# Patient Record
Sex: Female | Born: 1950 | Race: White | Hispanic: No | Marital: Married | Smoking: Never smoker
Health system: Southern US, Community
[De-identification: ages and names within clinical notes are randomized; demographics above are authoritative.]

## PROBLEM LIST (undated history)

## (undated) DIAGNOSIS — R42 Dizziness and giddiness: Secondary | ICD-10-CM

## (undated) DIAGNOSIS — K219 Gastro-esophageal reflux disease without esophagitis: Secondary | ICD-10-CM

## (undated) DIAGNOSIS — R7302 Impaired glucose tolerance (oral): Secondary | ICD-10-CM

## (undated) DIAGNOSIS — I809 Phlebitis and thrombophlebitis of unspecified site: Secondary | ICD-10-CM

## (undated) DIAGNOSIS — E669 Obesity, unspecified: Secondary | ICD-10-CM

## (undated) DIAGNOSIS — H8110 Benign paroxysmal vertigo, unspecified ear: Secondary | ICD-10-CM

## (undated) DIAGNOSIS — H8109 Meniere's disease, unspecified ear: Secondary | ICD-10-CM

## (undated) DIAGNOSIS — E785 Hyperlipidemia, unspecified: Secondary | ICD-10-CM

## (undated) DIAGNOSIS — T7840XA Allergy, unspecified, initial encounter: Secondary | ICD-10-CM

## (undated) DIAGNOSIS — M1990 Unspecified osteoarthritis, unspecified site: Secondary | ICD-10-CM

## (undated) DIAGNOSIS — I519 Heart disease, unspecified: Secondary | ICD-10-CM

## (undated) DIAGNOSIS — R768 Other specified abnormal immunological findings in serum: Secondary | ICD-10-CM

## (undated) HISTORY — DX: Obesity, unspecified: E66.9

## (undated) HISTORY — DX: Hyperlipidemia, unspecified: E78.5

## (undated) HISTORY — DX: Benign paroxysmal vertigo, unspecified ear: H81.10

## (undated) HISTORY — PX: OTHER SURGICAL HISTORY: SHX169

## (undated) HISTORY — DX: Heart disease, unspecified: I51.9

## (undated) HISTORY — DX: Dizziness and giddiness: R42

## (undated) HISTORY — DX: Other specified abnormal immunological findings in serum: R76.8

## (undated) HISTORY — DX: Impaired glucose tolerance (oral): R73.02

## (undated) HISTORY — DX: Gastro-esophageal reflux disease without esophagitis: K21.9

## (undated) HISTORY — DX: Phlebitis and thrombophlebitis of unspecified site: I80.9

## (undated) HISTORY — PX: BREAST BIOPSY: SHX20

## (undated) HISTORY — DX: Allergy, unspecified, initial encounter: T78.40XA

---

## 1998-12-20 ENCOUNTER — Other Ambulatory Visit: Admission: RE | Admit: 1998-12-20 | Discharge: 1998-12-20 | Payer: Self-pay | Admitting: Gynecology

## 1999-12-22 ENCOUNTER — Other Ambulatory Visit: Admission: RE | Admit: 1999-12-22 | Discharge: 1999-12-22 | Payer: Self-pay | Admitting: Gynecology

## 2001-01-02 ENCOUNTER — Encounter: Payer: Self-pay | Admitting: Gynecology

## 2001-01-02 ENCOUNTER — Ambulatory Visit (HOSPITAL_COMMUNITY): Admission: RE | Admit: 2001-01-02 | Discharge: 2001-01-02 | Payer: Self-pay | Admitting: Gynecology

## 2001-01-29 ENCOUNTER — Other Ambulatory Visit: Admission: RE | Admit: 2001-01-29 | Discharge: 2001-01-29 | Payer: Self-pay | Admitting: Gynecology

## 2002-01-02 ENCOUNTER — Encounter: Payer: Self-pay | Admitting: Gynecology

## 2002-01-02 ENCOUNTER — Ambulatory Visit (HOSPITAL_COMMUNITY): Admission: RE | Admit: 2002-01-02 | Discharge: 2002-01-02 | Payer: Self-pay | Admitting: Gynecology

## 2002-03-19 ENCOUNTER — Ambulatory Visit (HOSPITAL_COMMUNITY): Admission: RE | Admit: 2002-03-19 | Discharge: 2002-03-19 | Payer: Self-pay | Admitting: Orthopedic Surgery

## 2002-03-21 ENCOUNTER — Ambulatory Visit (HOSPITAL_BASED_OUTPATIENT_CLINIC_OR_DEPARTMENT_OTHER): Admission: RE | Admit: 2002-03-21 | Discharge: 2002-03-21 | Payer: Self-pay | Admitting: Orthopedic Surgery

## 2002-03-21 ENCOUNTER — Encounter (INDEPENDENT_AMBULATORY_CARE_PROVIDER_SITE_OTHER): Payer: Self-pay | Admitting: Specialist

## 2002-11-10 ENCOUNTER — Other Ambulatory Visit: Admission: RE | Admit: 2002-11-10 | Discharge: 2002-11-10 | Payer: Self-pay | Admitting: Gynecology

## 2003-01-15 ENCOUNTER — Ambulatory Visit (HOSPITAL_COMMUNITY): Admission: RE | Admit: 2003-01-15 | Discharge: 2003-01-15 | Payer: Self-pay | Admitting: Gynecology

## 2003-01-15 ENCOUNTER — Encounter: Payer: Self-pay | Admitting: Gynecology

## 2003-08-31 ENCOUNTER — Ambulatory Visit (HOSPITAL_COMMUNITY): Admission: RE | Admit: 2003-08-31 | Discharge: 2003-08-31 | Payer: Self-pay | Admitting: Neurosurgery

## 2003-11-23 ENCOUNTER — Other Ambulatory Visit: Admission: RE | Admit: 2003-11-23 | Discharge: 2003-11-23 | Payer: Self-pay | Admitting: Gynecology

## 2004-01-19 ENCOUNTER — Ambulatory Visit (HOSPITAL_COMMUNITY): Admission: RE | Admit: 2004-01-19 | Discharge: 2004-01-19 | Payer: Self-pay | Admitting: Gynecology

## 2004-03-16 ENCOUNTER — Ambulatory Visit (HOSPITAL_COMMUNITY): Admission: RE | Admit: 2004-03-16 | Discharge: 2004-03-16 | Payer: Self-pay | Admitting: Pulmonary Disease

## 2004-06-03 ENCOUNTER — Ambulatory Visit: Payer: Self-pay | Admitting: Cardiology

## 2004-08-05 ENCOUNTER — Ambulatory Visit (HOSPITAL_BASED_OUTPATIENT_CLINIC_OR_DEPARTMENT_OTHER): Admission: RE | Admit: 2004-08-05 | Discharge: 2004-08-05 | Payer: Self-pay | Admitting: Orthopedic Surgery

## 2005-01-03 ENCOUNTER — Other Ambulatory Visit: Admission: RE | Admit: 2005-01-03 | Discharge: 2005-01-03 | Payer: Self-pay | Admitting: Gynecology

## 2005-01-20 ENCOUNTER — Ambulatory Visit (HOSPITAL_COMMUNITY): Admission: RE | Admit: 2005-01-20 | Discharge: 2005-01-20 | Payer: Self-pay | Admitting: Gynecology

## 2005-02-13 ENCOUNTER — Ambulatory Visit: Payer: Self-pay | Admitting: Internal Medicine

## 2005-02-13 ENCOUNTER — Ambulatory Visit (HOSPITAL_COMMUNITY): Admission: RE | Admit: 2005-02-13 | Discharge: 2005-02-13 | Payer: Self-pay | Admitting: Internal Medicine

## 2005-02-13 HISTORY — PX: COLONOSCOPY: SHX174

## 2005-02-13 LAB — HM COLONOSCOPY: HM Colonoscopy: NORMAL

## 2005-05-11 ENCOUNTER — Ambulatory Visit (HOSPITAL_COMMUNITY): Admission: RE | Admit: 2005-05-11 | Discharge: 2005-05-11 | Payer: Self-pay | Admitting: Gynecology

## 2005-07-10 DIAGNOSIS — E669 Obesity, unspecified: Secondary | ICD-10-CM

## 2005-07-10 HISTORY — DX: Obesity, unspecified: E66.9

## 2005-09-14 ENCOUNTER — Ambulatory Visit: Payer: Self-pay | Admitting: Internal Medicine

## 2006-01-23 ENCOUNTER — Ambulatory Visit (HOSPITAL_COMMUNITY): Admission: RE | Admit: 2006-01-23 | Discharge: 2006-01-23 | Payer: Self-pay | Admitting: Gynecology

## 2007-01-28 ENCOUNTER — Ambulatory Visit (HOSPITAL_COMMUNITY): Admission: RE | Admit: 2007-01-28 | Discharge: 2007-01-28 | Payer: Self-pay | Admitting: Gynecology

## 2008-02-19 ENCOUNTER — Ambulatory Visit (HOSPITAL_COMMUNITY): Admission: RE | Admit: 2008-02-19 | Discharge: 2008-02-19 | Payer: Self-pay | Admitting: Gynecology

## 2008-06-03 ENCOUNTER — Ambulatory Visit: Payer: Self-pay | Admitting: Cardiology

## 2009-03-01 ENCOUNTER — Ambulatory Visit (HOSPITAL_COMMUNITY): Admission: RE | Admit: 2009-03-01 | Discharge: 2009-03-01 | Payer: Self-pay | Admitting: Gynecology

## 2009-05-27 ENCOUNTER — Encounter: Payer: Self-pay | Admitting: Family Medicine

## 2009-05-27 LAB — CONVERTED CEMR LAB: Pap Smear: NORMAL

## 2009-06-04 ENCOUNTER — Ambulatory Visit (HOSPITAL_COMMUNITY): Admission: RE | Admit: 2009-06-04 | Discharge: 2009-06-04 | Payer: Self-pay | Admitting: Gynecology

## 2009-06-21 ENCOUNTER — Ambulatory Visit: Payer: Self-pay | Admitting: Family Medicine

## 2009-06-21 DIAGNOSIS — E669 Obesity, unspecified: Secondary | ICD-10-CM

## 2009-06-26 ENCOUNTER — Encounter: Payer: Self-pay | Admitting: Family Medicine

## 2009-06-29 ENCOUNTER — Telehealth: Payer: Self-pay | Admitting: Family Medicine

## 2009-06-29 LAB — CONVERTED CEMR LAB
BUN: 15 mg/dL (ref 6–23)
CO2: 28 meq/L (ref 19–32)
Calcium: 9.1 mg/dL (ref 8.4–10.5)
Chloride: 102 meq/L (ref 96–112)
Cholesterol: 173 mg/dL (ref 0–200)
Creatinine, Ser: 0.72 mg/dL (ref 0.40–1.20)
Glucose, Bld: 100 mg/dL — ABNORMAL HIGH (ref 70–99)
Monocytes Absolute: 0.4 10*3/uL (ref 0.1–1.0)
Potassium: 4.6 meq/L (ref 3.5–5.3)
TSH: 2.468 microintl units/mL (ref 0.350–4.500)
VLDL: 22 mg/dL (ref 0–40)
Vit D, 25-Hydroxy: 28 ng/mL — ABNORMAL LOW (ref 30–89)
WBC: 8.6 10*3/uL (ref 4.0–10.5)

## 2009-09-20 ENCOUNTER — Telehealth: Payer: Self-pay | Admitting: Family Medicine

## 2009-09-20 ENCOUNTER — Encounter: Payer: Self-pay | Admitting: Family Medicine

## 2010-03-04 ENCOUNTER — Ambulatory Visit (HOSPITAL_COMMUNITY): Admission: RE | Admit: 2010-03-04 | Discharge: 2010-03-04 | Payer: Self-pay | Admitting: Gynecology

## 2010-04-25 ENCOUNTER — Encounter: Payer: Self-pay | Admitting: Family Medicine

## 2010-05-23 LAB — CONVERTED CEMR LAB: TSH: 2.569 microintl units/mL (ref 0.350–4.500)

## 2010-05-25 ENCOUNTER — Ambulatory Visit: Payer: Self-pay | Admitting: Family Medicine

## 2010-05-25 DIAGNOSIS — R42 Dizziness and giddiness: Secondary | ICD-10-CM | POA: Insufficient documentation

## 2010-06-30 ENCOUNTER — Encounter: Payer: Self-pay | Admitting: Family Medicine

## 2010-08-09 NOTE — Assessment & Plan Note (Signed)
Summary: OV   Vital Signs:  Patient profile:   60 year old female Menstrual status:  postmenopausal Height:      60 inches Weight:      178 pounds BMI:     34.89 O2 Sat:      96 % on Room air Pulse rate:   102 / minute Pulse rhythm:   regular Resp:     16 per minute BP sitting:   120 / 80  (right arm)  Vitals Entered By: Mauricia Area CMA (May 25, 2010 2:43 PM)  Nutrition Counseling: Patient's BMI is greater than 25 and therefore counseled on weight management options.  O2 Flow:  Room air CC: Dizziness   Primary Care Provider:  Syliva Overman, MD  CC:  Dizziness.  History of Present Illness: 3 day h/o vertigo with no apparent trigger, progressively worsening/no change, desp[pite use of decongestant and ear drops. This is er second ever episode , the first was 16 years ago, she had both meclizine  from the ED and had  the eplich manouever done thru ENT the same week  which resolved her symptoms, states themeclizine knocjked her out.  No recent viral illness, no resp symptoms    Current Medications (verified): 1)  Multivitamins  Tabs (Multiple Vitamin) .... Take 1 Tablet By Mouth Once A Day 2)  Vitamin D 1000 Unit Tabs (Cholecalciferol) .... One Tab Every Other Day 3)  Aspirin 81 Mg .Marland Kitchen.. 1 Tab Daily  Allergies (verified): 1)  ! Pcn 2)  ! Steroids  Review of Systems      See HPI Eyes:  Denies discharge and red eye. ENT:  Denies nasal congestion and postnasal drainage. CV:  Denies chest pain or discomfort, palpitations, and swelling of feet. Resp:  Denies cough and sputum productive. GI:  Denies abdominal pain, constipation, diarrhea, nausea, and vomiting blood. GU:  Denies dysuria and urinary frequency. MS:  Denies joint pain and stiffness. Neuro:  Complains of sensation of room spinning; denies falling down, headaches, and memory loss. Psych:  Denies anxiety and depression. Endo:  Denies cold intolerance, excessive thirst, heat intolerance, and  polyuria. Heme:  Denies abnormal bruising and bleeding. Allergy:  Denies hives or rash.  Physical Exam  General:  Well-developed,well-nourished,in no acute distress; alert,appropriate and cooperative throughout examination HEENT: No facial asymmetry,  EOMI, No sinus tenderness, TM's Clear, oropharynx  pink and moist.   Chest: Clear to auscultation bilaterally.  CVS: S1, S2, No murmurs, No S3.   Abd: Soft, Nontender.  MS: Adequate ROM spine, hips, shoulders and knees.  Ext: No edema.   CNS: CN 2-12 intact, power tone and sensation normal throughout.   Skin: Intact, no visible lesions or rashes.  Psych: Good eye contact, normal affect.  Memory intact, not anxious or depressed appearing.    Impression & Recommendations:  Problem # 1:  VERTIGO (ICD-780.4) Assessment Comment Only  Her updated medication list for this problem includes:    Antivert 12.5 Mg Tabs (Meclizine hcl) .Marland Kitchen... Take 1 tablet by mouth three times a day as needed for vertigo  Problem # 2:  OBESITY, UNSPECIFIED (ICD-278.00) Assessment: Deteriorated  Ht: 60 (05/25/2010)   Wt: 178 (05/25/2010)   BMI: 34.89 (05/25/2010)  Complete Medication List: 1)  Multivitamins Tabs (Multiple vitamin) .... Take 1 tablet by mouth once a day 2)  Vitamin D 1000 Unit Tabs (Cholecalciferol) .... One tab every other day 3)  Aspirin 81 Mg  .Marland Kitchen.. 1 tab daily 4)  Antivert 12.5 Mg Tabs (Meclizine  hcl) .... Take 1 tablet by mouth three times a day as needed for vertigo  Other Orders: T- Hemoglobin A1C ([id number]) T-TSH [phone]) T-Vitamin D (25-Hydroxy) ([id number])  Patient Instructions: 1)  Please schedule a follow-up appointment in 4 months. 2)  It is important that you exercise regularly at least 20 minutes 5 times a week. If you develop chest pain, have severe difficulty breathing, or feel very tired , stop exercising immediately and seek medical attention. 3)  You need to lose weight. Consider a lower calorie diet and  regular exercise.  4)  TSH prior to visit, ICD-9: 5)  HbgA1C prior to visit, ICD-9:   afsting asap 6)  Vitamin D Prescriptions: ANTIVERT 12.5 MG TABS (MECLIZINE HCL) Take 1 tablet by mouth three times a day as needed for vertigo  #30 x 0   Entered and Authorized by:   Syliva Overman MD   Signed by:   Syliva Overman MD on 05/25/2010   Method used:   Electronically to        [facility] (retail)       [address]       [place], [place]  [address]       Ph: [phone] or [phone]       Fax: [phone]   RxID:   [id number]    Orders Added: 1)  Est. Patient Level IV [99214] 2)  T- Hemoglobin A1C [[id number]] 3)  T-TSH [[id number]] 4)  T-Vitamin D (25-Hydroxy) [phone]

## 2010-08-09 NOTE — Letter (Signed)
Summary: SCREENING RESULTS SUMMARY  SCREENING RESULTS SUMMARY   Imported By: Lind Guest 09/20/2009 08:48:40  _____________________________________________________________________  External Attachment:    Type:   Image     Comment:   External Document

## 2010-08-09 NOTE — Progress Notes (Signed)
  Phone Note Other Incoming   Caller: DR SIMPSON Summary of Call: PLS CALL PT ADVISE HER SCREENING RESULTS SHOWE A LITTLE PLAQUE IN HER CAROTID ARTERIES , which increases her risk for stroke. She needs to continue regular exercise, low fatdiet and weightloss. She also should be taking asprin 81mg  one daily, preferably uncoated Initial call taken by: Syliva Overman MD,  September 20, 2009 6:02 PM  Follow-up for Phone Call        PATIENT AWARE Follow-up by: Adella Hare LPN,  September 22, 2009 10:24 AM

## 2010-08-09 NOTE — Letter (Signed)
Summary: HEALTH REPORT   HEALTH REPORT   Imported By: Lind Guest 05/27/2010 10:12:16  _____________________________________________________________________  External Attachment:    Type:   Image     Comment:   External Document

## 2010-09-13 ENCOUNTER — Encounter: Payer: Self-pay | Admitting: Family Medicine

## 2010-09-27 ENCOUNTER — Ambulatory Visit: Payer: Self-pay | Admitting: Family Medicine

## 2010-11-25 NOTE — Op Note (Signed)
NAME:  Tracy Humphrey, Tracy Humphrey                             ACCOUNT NO.:  [id number]   MEDICAL RECORD NO.:  [id number]                   PATIENT TYPE:  AMB   LOCATION:  DSC                                  FACILITY:  [facility]   PHYSICIAN:  Harvie Junior, M.D.                DATE OF BIRTH:  1950/11/12   DATE OF PROCEDURE:  03/21/2002  DATE OF DISCHARGE:                                 OPERATIVE REPORT   PREOPERATIVE DIAGNOSES:  1. Mass, right ankle.  2. Bunionette deformity.   POSTOPERATIVE DIAGNOSES:  1. Mass, right ankle.  2. Bunionette deformity.   PROCEDURES:  1. Excision of ankle mass, deep.  2. Excision of metatarsal prominence, fifth metatarsal, with release of     medial capsular structure and imbrication of lateral capsule.   SURGEON:  Harvie Junior, M.D.   ASSISTANT:  Currie Paris. Thedore Mins.   ANESTHESIA:  General.   BRIEF HISTORY:  She is a 60 year old female with a long history of having  ankle pain with a concern of a mass as well as having prominence of her  little metatarsal with shoe wear pain.  After failure of all conservative  care, she is ultimately taken to the operating room for excisional biopsy of  mass at the ankle and straightening up of the little toe.   DESCRIPTION OF PROCEDURE:  The patient was brought to the operating room and  after adequate anesthesia was obtained with a general anesthetic, the  patient was placed supine upon the operating table.  The right leg was  prepped and draped in the usual sterile fashion.  Following Esmarch  exsanguination of the extremity, a blood pressure tourniquet was inflated to  250 mmHg.  Following this, a linear incision was made over the mass in the  ankle in the subcutaneous tissues, taken down to the level of this mass.  The mass was encapsulated and divided on all sides and easily shelled out.  It appeared to be some combination of lipomatous tissue with some normal fat  structures.  This mass was excised en masse  and sent to pathology for  evaluation.  At this point attention was turned to the little toe, where an  incision was made over the lateral aspect of the fifth metatarsal, and  dissection was carried down to the metatarsal head.  A 3 mm band of capsular  tissue was excised, and the lateral border of the metatarsal head was  excised.  At this point attention was turned toward the medial aspect of the  capsule, where multiple perforations were made in the medial capsule.  Fluoroscopic imaging was used to make sure that the entire bump was removed  in line with the metatarsal head.  Following this attention was turned  toward closing the capsular tissue.  One the capsular tissue was closed, the  toe straightened nicely at this point.  At this, the wound was copiously  irrigated and suctioned dry.  The subcutaneous layer was then closed with a  4-0 Vicryl interrupted suture.  The ankle area was closed with 3-0 Vicryl and 4-0 nylon interrupted suture.  A sterile and compressive dressing was applied over these two areas.  The  patient was taken to the recovery room, where she was noted to be in  satisfactory condition.  Estimated blood loss for the procedure was less  than 50 cc.                                                Harvie Junior, M.D.    Ranae Plumber  D:  03/21/2002  T:  03/21/2002  Job:  [id number]

## 2010-11-25 NOTE — Op Note (Signed)
NAMESHELIAH, Tracy Humphrey                   ACCOUNT NO.:  [id number]   MEDICAL RECORD NO.:  [id number]          PATIENT TYPE:  AMB   LOCATION:  DSC                          FACILITY:  [facility]   PHYSICIAN:  Harvie Junior, M.D.   DATE OF BIRTH:  05-Nov-1950   DATE OF PROCEDURE:  08/05/2004  DATE OF DISCHARGE:                                 OPERATIVE REPORT   PREOPERATIVE DIAGNOSIS:  Painful little toe status post bunion resection  with suspected neuroma versus continued prominence.   POSTOPERATIVE DIAGNOSIS:  Painful little toe status post bunion resection  with suspected neuroma versus continued prominence.   PROCEDURE:  1.  Partial exostosis excision of the fifth metatarsal.  2.  Excision of painful neuromatous tissue, right fifth metatarsal area.   SURGEON:  Harvie Junior, M.D.   ASSISTANT:  Marshia Ly, P.A.   ANESTHESIA:  General.   BRIEF HISTORY:  She is a 60 year old female with a long history of having  significant prominence and pain over the lateral aspect of the little toe.  She was ultimately taken to the operating room for bunion excision and  repair.  She tolerated the surgery well but had painful scarring with  prominent callus.  We talked about treatment options, followed it for  awhile, did steroid injections, multiple, none of this seemed to help her  dramatically, although it would help for a short period of time.  Because of  continued complaints of pain and following for a long period of time, we  ultimately elected to take her back to the operating room for excision of  the painful prominence and she is brought to the operating room for this  procedure.   DESCRIPTION OF PROCEDURE:  The patient was brought to the operating room and  after adequate anesthesia was obtained with a general anesthetic, the  patient was placed on the operating table.  The right foot was then prepped  and draped in the usual sterile fashion.  Following this, a marking pen was  used and  the callused area was excised and the deep tissue removed with it.  This gave access to what appeared to be some neuromatous type tissue below  right over the lateral prominence.  The  neuromatous tissue was removed with  a combination of a knife and rongeur and once this had been completed, the  area was copiously irrigated.  The tourniquet was let down to make sure  there was no significant bleeding and finding none, attention was turned to  the capsular layer.  The capsule was opened longitudinally and the  metatarsal head prominence was resected.  At this point, the capsule was  then closed  with 4-0 Vicryl interrupted sutures, the skin with 4-0 nylon sutures.  At  this point, a sterile compressive dressing was applied and the patient was  taken to the recovery room where she was noted to be in satisfactory  condition.  She will be sent to home in a hard sole shoe.      JLG/MEDQ  D:  08/05/2004  T:  08/05/2004  Job:  [id number]

## 2010-11-25 NOTE — Op Note (Signed)
NAMEBROOKLEY, SPITLER                   ACCOUNT NO.:  [id number]   MEDICAL RECORD NO.:  [id number]          PATIENT TYPE:  AMB   LOCATION:  DAY                           FACILITY:  [facility]   PHYSICIAN:  R. Roetta Sessions, M.D. DATE OF BIRTH:  04/14/1951   DATE OF PROCEDURE:  02/13/2005  DATE OF DISCHARGE:                                 OPERATIVE REPORT   INDICATIONS FOR PROCEDURE:  The patient is a 60 year old lady referred out  of courtesy of Dr. Greta Doom in [place] for colonoscopy. She is devoid of any  lower GI tract symptoms. She has never had her colon imaged. There is no  family history of colorectal neoplasia. Colonoscopy is now being done. This  approach has been discussed with the patient at length. Potential risks,  benefits, and alternatives have been reviewed and questions answered. She is  agreeable. Please see documentation in the medical record.   PROCEDURE NOTE:  O2 saturation, blood pressure, pulse, and respirations were  monitored throughout the entire procedure. Conscious sedation with Versed 4  mg IV and Demerol 75 mg IV in divided doses.   INSTRUMENT:  Olympus video chip system.   FINDINGS:  Digital rectal exam revealed no abnormalities.   ENDOSCOPIC FINDINGS:  Prep was good.   Rectum:  Examination of the rectal mucosa including retroflexed view of the  anal verge revealed no abnormalities.   Colon:  Colonic mucosa was surveyed from the rectosigmoid junction through  the left, transverse, and right colon to the area of the appendiceal  orifice, ileocecal valve, and cecum. These structures were well seen and  photographed for the record. From this level, the scope was slowly  withdrawn, and all previously mentioned mucosal surfaces were again seen.  The colonic mucosa appeared normal. The patient tolerated the procedure well  and was reactive to endoscopy.   IMPRESSION:  1. Normal rectum.  2. Normal colon.   RECOMMENDATIONS:  Repeat screening colonoscopy in 10  years.      RMR/MEDQ  D:  02/13/2005  T:  02/13/2005  Job:  [id number]

## 2011-02-07 ENCOUNTER — Ambulatory Visit: Payer: Self-pay | Admitting: Family Medicine

## 2011-02-22 ENCOUNTER — Other Ambulatory Visit: Payer: Self-pay | Admitting: Family Medicine

## 2011-02-22 DIAGNOSIS — Z139 Encounter for screening, unspecified: Secondary | ICD-10-CM

## 2011-03-06 ENCOUNTER — Ambulatory Visit (HOSPITAL_COMMUNITY)
Admission: RE | Admit: 2011-03-06 | Discharge: 2011-03-06 | Disposition: A | Discharge status: Home / Self Care | Payer: 59 | Source: Ambulatory Visit | Attending: Family Medicine | Admitting: Family Medicine

## 2011-03-06 DIAGNOSIS — Z1231 Encounter for screening mammogram for malignant neoplasm of breast: Secondary | ICD-10-CM | POA: Insufficient documentation

## 2011-03-06 DIAGNOSIS — Z139 Encounter for screening, unspecified: Secondary | ICD-10-CM

## 2011-03-08 ENCOUNTER — Ambulatory Visit (INDEPENDENT_AMBULATORY_CARE_PROVIDER_SITE_OTHER): Payer: 59 | Admitting: Family Medicine

## 2011-03-08 ENCOUNTER — Encounter: Payer: Self-pay | Admitting: Family Medicine

## 2011-03-08 VITALS — BP 138/82 | HR 94 | Temp 99.0°F | Resp 16 | Ht 60.0 in | Wt 174.0 lb

## 2011-03-08 DIAGNOSIS — R51 Headache: Secondary | ICD-10-CM | POA: Insufficient documentation

## 2011-03-08 DIAGNOSIS — H9201 Otalgia, right ear: Secondary | ICD-10-CM | POA: Insufficient documentation

## 2011-03-08 DIAGNOSIS — J309 Allergic rhinitis, unspecified: Secondary | ICD-10-CM

## 2011-03-08 DIAGNOSIS — H9209 Otalgia, unspecified ear: Secondary | ICD-10-CM

## 2011-03-08 DIAGNOSIS — E669 Obesity, unspecified: Secondary | ICD-10-CM

## 2011-03-08 DIAGNOSIS — R519 Headache, unspecified: Secondary | ICD-10-CM | POA: Insufficient documentation

## 2011-03-08 MED ORDER — SULFAMETHOXAZOLE-TRIMETHOPRIM 800-160 MG PO TABS: 1.0000 | ORAL_TABLET | Freq: Two times a day (BID) | ORAL | Status: AC

## 2011-03-08 MED ORDER — FLUCONAZOLE 150 MG PO TABS: ORAL_TABLET | ORAL | Status: DC

## 2011-03-08 NOTE — Assessment & Plan Note (Signed)
New sharp headaches, if persist, barin scan indicated

## 2011-03-08 NOTE — Assessment & Plan Note (Signed)
Unchanged. Patient re-educated about  the importance of commitment to a  minimum of 150 minutes of exercise per week. The importance of healthy food choices with portion control discussed. Encouraged to start a food diary, count calories and to consider  joining a support group. Sample diet sheets offered. Goals set by the patient for the next several months.    

## 2011-03-08 NOTE — Patient Instructions (Signed)
F/u in 6 months.  You are being prescribed an antibiotic please take the entire 7 day course.  If you continue to experience sharp apins in your head pls contact me so that I order a brain scan and refer you to a specialist.  Lifestyle goals of daily exercise at least 5 days per week, and a diet rich in fruit and vegetables is encouraged. A weight loss goal of 2 pounds per month is realisticand adequate for you.  I am  concerned about your blood pressure, so it is important that you follow through on these recommendations

## 2011-03-08 NOTE — Assessment & Plan Note (Signed)
Antibiotic prescribed, if no improvement will need brain scan and referral

## 2011-03-08 NOTE — Progress Notes (Signed)
  Subjective:    Patient ID: Tracy Humphrey, female    DOB: 18-Nov-1950, 60 y.o.   MRN: [id number]  HPI 3 day h/o sharp lancinating pains behind right ear intermittently, woke pt this morning at 3 am and has not been able to sleep since then.States she has experienced this in the past, uncertain if related to sinus symptoms in the past She has an approx 3 week h/o excessive head congestion with some sinus pressure and drainage, the right ear itself at times has had some pressure.She denies any recent fever , chills , sore throat or cough. Has only used OTC meds    Review of Systems  See HPI Denies recent fever or chills. Denies  sore throat. Denies chest congestion, productive cough or wheezing. Denies chest pains, palpitations and leg swelling Denies abdominal pain, nausea, vomiting,diarrhea or constipation.   Denies dysuria, frequency, hesitancy or incontinence. Denies joint pain, swelling and limitation in mobility. Denies numbness, or tingling. Denies depression, anxiety or insomnia. Denies skin break down or rash.        Objective:   Physical Exam  Patient alert and oriented and in no cardiopulmonary distress.  HEENT: No facial asymmetry, EOMI, no sinus tenderness,  oropharynx pink and moist.  Neck supple no adenopathy. Tender over posterior right ear on scalp, skin and scalp normal Chest: Clear to auscultation bilaterally.  CVS: S1, S2 no murmurs, no S3.  ABD: Soft non tender. Bowel sounds normal.  Ext: No edema  MS: Adequate ROM spine, shoulders, hips and knees.   Psych: Good eye contact, normal affect. Memory intact not anxious or depressed appearing.  CNS: CN 2-12 intact, power,normal throughout.       Assessment & Plan:

## 2011-03-10 ENCOUNTER — Telehealth: Payer: Self-pay | Admitting: Family Medicine

## 2011-03-10 NOTE — Telephone Encounter (Signed)
I am very happy to hear this

## 2011-03-10 NOTE — Telephone Encounter (Signed)
noted 

## 2011-06-08 ENCOUNTER — Telehealth: Payer: Self-pay | Admitting: Family Medicine

## 2011-06-08 ENCOUNTER — Other Ambulatory Visit: Payer: Self-pay | Admitting: Family Medicine

## 2011-06-08 MED ORDER — SCOPOLAMINE 1 MG/3DAYS TD PT72: 1.0000 | MEDICATED_PATCH | TRANSDERMAL | Status: AC

## 2011-06-08 NOTE — Telephone Encounter (Signed)
Requests trans copolamine for travel, will send to pharmacy, pls lert her know sent to her local pharmacy on file

## 2011-06-09 NOTE — Telephone Encounter (Signed)
Pt aware.

## 2011-06-15 ENCOUNTER — Ambulatory Visit (INDEPENDENT_AMBULATORY_CARE_PROVIDER_SITE_OTHER): Payer: 59 | Admitting: Otolaryngology

## 2011-06-15 DIAGNOSIS — H903 Sensorineural hearing loss, bilateral: Secondary | ICD-10-CM

## 2011-06-15 DIAGNOSIS — R42 Dizziness and giddiness: Secondary | ICD-10-CM

## 2011-06-15 DIAGNOSIS — H8109 Meniere's disease, unspecified ear: Secondary | ICD-10-CM

## 2011-06-15 DIAGNOSIS — H8110 Benign paroxysmal vertigo, unspecified ear: Secondary | ICD-10-CM

## 2011-06-19 ENCOUNTER — Telehealth: Payer: Self-pay | Admitting: Family Medicine

## 2011-06-19 NOTE — Telephone Encounter (Signed)
Pt called in on 12/06 with a c/o disabling vertigo since 06/13/2011, which actually prevented her from taking planned vacation. She was not responding to meclizine, which she already had, so an urgent appt was made with ENT who graciously agreed to see her that same evening.  The ENT notes are available for review, pt states ENT was a bit surprised she had never had a brain scan, and actually a few months agio pt had been seen by me in the office with head and  neck pain, and at that time I had mentioned the possible need to scan her brain if her symptoms persisted or worsened. Today 12/10 is her best day yet, though she still had to take valium this morning, still very symptomatic. I will schedule an open MRI brain with gado contrast per ent recommendation for evaluation of severe vertigo

## 2011-06-20 ENCOUNTER — Other Ambulatory Visit: Payer: Self-pay | Admitting: Family Medicine

## 2011-06-20 ENCOUNTER — Other Ambulatory Visit: Payer: Self-pay

## 2011-06-20 DIAGNOSIS — R42 Dizziness and giddiness: Secondary | ICD-10-CM

## 2011-06-20 LAB — COMPREHENSIVE METABOLIC PANEL
Albumin: 4.3 g/dL (ref 3.5–5.2)
Calcium: 10 mg/dL (ref 8.4–10.5)
Chloride: 102 mEq/L (ref 96–112)
Creat: 0.63 mg/dL (ref 0.50–1.10)
Potassium: 4.5 mEq/L (ref 3.5–5.3)

## 2011-06-22 ENCOUNTER — Ambulatory Visit
Admission: RE | Admit: 2011-06-22 | Discharge: 2011-06-22 | Disposition: A | Discharge status: Home / Self Care | Payer: Self-pay | Source: Ambulatory Visit | Attending: Family Medicine | Admitting: Family Medicine

## 2011-06-22 DIAGNOSIS — R42 Dizziness and giddiness: Secondary | ICD-10-CM

## 2011-06-22 MED ORDER — GADOBENATE DIMEGLUMINE 529 MG/ML IV SOLN
15.0000 mL | Freq: Once | INTRAVENOUS | Status: AC | PRN
  Administered 2011-06-22: 15 mL via INTRAVENOUS

## 2011-06-29 ENCOUNTER — Other Ambulatory Visit: Payer: Self-pay | Admitting: Family Medicine

## 2011-06-29 ENCOUNTER — Telehealth: Payer: Self-pay | Admitting: Family Medicine

## 2011-06-29 DIAGNOSIS — R42 Dizziness and giddiness: Secondary | ICD-10-CM

## 2011-06-29 NOTE — Telephone Encounter (Signed)
3 week h/o disabling vertigo, I recommend neurology evaluation, has had some resopnse to valium and has ENT f/u , pls refer to neurologist reynolds, love or weyman for further evaluation pls fax over the MRI of the brain, January appt is fine , they are all in [place] in the same office , but Dr Thad Ranger may be no longer there, either of the 2 listed is fine, thanks, appt preferred in January

## 2011-07-06 ENCOUNTER — Ambulatory Visit (INDEPENDENT_AMBULATORY_CARE_PROVIDER_SITE_OTHER): Payer: Commercial Managed Care - PPO | Admitting: Otolaryngology

## 2011-07-06 DIAGNOSIS — H8110 Benign paroxysmal vertigo, unspecified ear: Secondary | ICD-10-CM

## 2011-07-06 DIAGNOSIS — R42 Dizziness and giddiness: Secondary | ICD-10-CM

## 2011-07-10 NOTE — Telephone Encounter (Signed)
noted 

## 2011-07-10 NOTE — Telephone Encounter (Signed)
Patient went to DR. Teohs and he said she did not need to go to neurologist had appointment with Dr. Modesto Charon and patient stated to cancel this

## 2011-07-11 DIAGNOSIS — E785 Hyperlipidemia, unspecified: Secondary | ICD-10-CM

## 2011-07-11 DIAGNOSIS — R7302 Impaired glucose tolerance (oral): Secondary | ICD-10-CM

## 2011-07-11 HISTORY — DX: Impaired glucose tolerance (oral): R73.02

## 2011-07-11 HISTORY — DX: Hyperlipidemia, unspecified: E78.5

## 2011-07-20 ENCOUNTER — Ambulatory Visit (INDEPENDENT_AMBULATORY_CARE_PROVIDER_SITE_OTHER): Payer: Commercial Managed Care - PPO | Admitting: Otolaryngology

## 2011-08-17 ENCOUNTER — Ambulatory Visit: Payer: Self-pay | Admitting: Neurology

## 2011-10-09 ENCOUNTER — Telehealth: Payer: Self-pay

## 2011-10-09 DIAGNOSIS — R5383 Other fatigue: Secondary | ICD-10-CM

## 2011-10-09 DIAGNOSIS — E669 Obesity, unspecified: Secondary | ICD-10-CM

## 2011-10-09 DIAGNOSIS — Z139 Encounter for screening, unspecified: Secondary | ICD-10-CM

## 2011-10-09 NOTE — Telephone Encounter (Signed)
Patient needs order for labwork. Printed and gave to patient

## 2011-10-11 ENCOUNTER — Other Ambulatory Visit: Payer: Self-pay | Admitting: Family Medicine

## 2011-10-11 LAB — BASIC METABOLIC PANEL
BUN: 18 mg/dL (ref 6–23)
CO2: 28 mEq/L (ref 19–32)
Sodium: 142 mEq/L (ref 135–145)

## 2011-10-11 LAB — CBC WITH DIFFERENTIAL/PLATELET
Basophils Absolute: 0 10*3/uL (ref 0.0–0.1)
HCT: 41.3 % (ref 36.0–46.0)
Lymphocytes Relative: 34 % (ref 12–46)
Lymphs Abs: 2.5 10*3/uL (ref 0.7–4.0)
MCH: 28.4 pg (ref 26.0–34.0)
Monocytes Relative: 6 % (ref 3–12)
Neutro Abs: 4.3 10*3/uL (ref 1.7–7.7)
Neutrophils Relative %: 59 % (ref 43–77)
Platelets: 185 10*3/uL (ref 150–400)
RBC: 4.68 MIL/uL (ref 3.87–5.11)
RDW: 15 % (ref 11.5–15.5)

## 2011-10-11 LAB — LIPID PANEL
Cholesterol: 182 mg/dL (ref 0–200)
HDL: 45 mg/dL (ref >39)
LDL Cholesterol: 117 mg/dL — ABNORMAL HIGH (ref 0–99)
Triglycerides: 101 mg/dL (ref <150)
VLDL: 20 mg/dL (ref 0–40)

## 2011-10-11 LAB — TSH: TSH: 2.347 u[IU]/mL (ref 0.350–4.500)

## 2011-10-12 LAB — HEMOGLOBIN A1C
Hgb A1c MFr Bld: 6.3 % — ABNORMAL HIGH
Mean Plasma Glucose: 134 mg/dL — ABNORMAL HIGH

## 2011-10-12 LAB — VITAMIN D 25 HYDROXY (VIT D DEFICIENCY, FRACTURES): Vit D, 25-Hydroxy: 47 ng/mL (ref 30–89)

## 2011-10-19 ENCOUNTER — Ambulatory Visit: Payer: Commercial Managed Care - PPO | Admitting: Family Medicine

## 2011-10-19 ENCOUNTER — Ambulatory Visit (INDEPENDENT_AMBULATORY_CARE_PROVIDER_SITE_OTHER): Payer: Commercial Managed Care - PPO | Admitting: Otolaryngology

## 2011-10-19 ENCOUNTER — Encounter: Payer: Self-pay | Admitting: Family Medicine

## 2011-10-19 VITALS — BP 124/84 | HR 86 | Resp 18 | Ht 60.0 in | Wt 170.0 lb

## 2011-10-19 DIAGNOSIS — E8881 Metabolic syndrome: Secondary | ICD-10-CM | POA: Insufficient documentation

## 2011-10-19 DIAGNOSIS — H8109 Meniere's disease, unspecified ear: Secondary | ICD-10-CM

## 2011-10-19 DIAGNOSIS — H903 Sensorineural hearing loss, bilateral: Secondary | ICD-10-CM

## 2011-10-19 DIAGNOSIS — E785 Hyperlipidemia, unspecified: Secondary | ICD-10-CM

## 2011-10-19 DIAGNOSIS — E669 Obesity, unspecified: Secondary | ICD-10-CM

## 2011-10-19 DIAGNOSIS — R7303 Prediabetes: Secondary | ICD-10-CM

## 2011-10-19 DIAGNOSIS — R7301 Impaired fasting glucose: Secondary | ICD-10-CM

## 2011-10-19 DIAGNOSIS — H9202 Otalgia, left ear: Secondary | ICD-10-CM

## 2011-10-19 DIAGNOSIS — R42 Dizziness and giddiness: Secondary | ICD-10-CM

## 2011-10-19 DIAGNOSIS — E119 Type 2 diabetes mellitus without complications: Secondary | ICD-10-CM | POA: Insufficient documentation

## 2011-10-19 DIAGNOSIS — H814 Vertigo of central origin: Secondary | ICD-10-CM

## 2011-10-19 NOTE — Patient Instructions (Addendum)
F/u in 4 month  You are now prediabetic, and your bad cholesterol is higher than it should be.  Please follow a carbohydrate restricted   diet, we will provide information on this.  Please attend a diabetic education group session or individual counseling session with the nutritionist at the hospital to help you to clearly understand how to eat. This is very important  Please commit to 30 minutes 5 days per week of physical acitivity, when you are able to physically  A healthy diet is rich in fruit, vegetables and whole grains. Poultry fish, nuts and beans are a healthy choice for protein rather then red meat. A low sodium diet and drinking 64 ounces of water daily is generally recommended. Oils and sweet should be limited. Carbohydrates especially for those who are diabetic or overweight, should be limited to 30-45 gram per meal. It is important to eat on a regular schedule, at least 3 times daily. Snacks should be primarily fruits, vegetables or nuts.   Weight loss goal of 3 to 6 pounds per month  No medication at this time  You need to see ENT for recurrent vertigo today, and I strongly encourage you too be more open to the need for medication to either prevent frequency and severity of recurrence , as well as medication to take when you have the beginning of a bad episode. Medication is needed for some conditions some times!  HBA1C and fasting blood sugar and lipids in 4 month  I hope you feel better soon, and I know that you will do extremely well with lifestyle changes to improve your health

## 2011-10-19 NOTE — Progress Notes (Signed)
  Subjective:    Patient ID: Tracy Humphrey, female    DOB: 11/14/1950, 61 y.o.   MRN: [id number]  HPI 2 day h/o disabling vertigo. Pt recently exposed to significant pressure changes and temperature which she believes is contributing. Also c/o increased allergy symptoms with season change, using sudafed effectively, however 1 week h/o pressure in left ear. Since December 2012, Tracy Humphrey has had significant vertigo, has an established dx of meneire's , however following ENT eval today, states she was told her current flare of symptoms was likely due to this and not to BPV which she has also had in the past. She is also in to discuss recent lab abnormalitise wich make her prediabetic as well as dyslipidemic. She is highly motivated to lifelstyle change which will address both of these and improve her health   Review of Systems See HPI Denies recent fever or chills. Denies  sore throat. Denies chest congestion, productive cough or wheezing. Denies chest pains, palpitations and leg swelling Denies abdominal pain, nausea, vomiting,diarrhea or constipation.   Denies dysuria, frequency, hesitancy or incontinence. Denies swelling and limitation in mobility. Denies headaches, seizures, numbness, or tingling. Denies  anxiety or insomnia. Denies skin break down or rash.        Objective:   Physical Exam  Patient alert and oriented and in no cardiopulmonary distress.  HEENT: No facial asymmetry, EOMI, no sinus tenderness,  oropharynx pink and moist.  Neck supple no adenopathy.Good light reflex bilaterally. No nystagmus  Chest: Clear to auscultation bilaterally.  CVS: S1, S2 no murmurs, no S3.  ABD: Soft non tender. Bowel sounds normal.  Ext: No edema  MS: Adequate ROM spine, shoulders, hips and knees.  Skin: Intact, no ulcerations or rash noted.  Psych: Good eye contact, normal affect. Memory intact not anxious or depressed appearing.  CNS: CN 2-12 intact, power, tone and sensation normal  throughout.       Assessment & Plan:

## 2011-10-22 NOTE — Assessment & Plan Note (Signed)
Deteriorated. Patient re-educated about  the importance of commitment to a  minimum of 150 minutes of exercise per week. The importance of healthy food choices with portion control discussed. Encouraged to start a food diary, count calories and to consider  joining a support group. Sample diet sheets offered. Goals set by the patient for the next several months.    

## 2011-10-22 NOTE — Assessment & Plan Note (Signed)
Recurrent vertigo x 2 days. Has been troubled by verigo intermittently x 5 months, ENT to re evaluate today

## 2011-10-22 NOTE — Assessment & Plan Note (Signed)
Good light reflex, cerumen partially obscuring TM, will wait on ENT eval for further management

## 2011-10-22 NOTE — Assessment & Plan Note (Signed)
Nutrition counseling done in office, literaure provided , and pt encouraged strongly to attend group or individual diabetic teaching

## 2011-10-22 NOTE — Assessment & Plan Note (Signed)
Hyperlipidemia:Low fat diet discussed and encouraged.  Repeat labs in 4 monht

## 2011-11-04 DIAGNOSIS — H8110 Benign paroxysmal vertigo, unspecified ear: Secondary | ICD-10-CM

## 2011-11-04 HISTORY — DX: Benign paroxysmal vertigo, unspecified ear: H81.10

## 2011-11-10 ENCOUNTER — Telehealth (HOSPITAL_COMMUNITY): Payer: Self-pay | Admitting: Dietician

## 2011-11-13 NOTE — Telephone Encounter (Signed)
Left message on voicemail confirming registration for requested class time.

## 2011-11-13 NOTE — Telephone Encounter (Signed)
Received referral from Dr. Lodema Hong on 11/09/11. Reviewed last progress note and Dr. Lodema Hong is referring pt for either group or individual counseling for pre-diabetes. Referral form accompanied fax with request from pt to attend group diabetes class on 11/28/11 at 10 AM.

## 2011-11-20 ENCOUNTER — Ambulatory Visit: Payer: Commercial Managed Care - PPO | Admitting: Family Medicine

## 2011-11-28 ENCOUNTER — Encounter (HOSPITAL_COMMUNITY): Payer: Self-pay | Admitting: Dietician

## 2011-11-28 NOTE — Progress Notes (Signed)
[facility] Diabetes Class Completion  Date:Nov 28, 2011  Time: 10:00 AM  Pt attended [facility]'s Diabetes Class on Nov 28, 2011.   Patient was educated on the following topics: carbohydrate metabolism in relation to diabetes, sources of carbohydrate, carbohydrate counting, meal planning strategies, food label reading, and portion control.   Emmory Solivan A. Kayan, RD, LDN Date:Nov 28, 2011 Time: 10:00 AM 

## 2012-02-08 LAB — HEMOGLOBIN A1C
Hgb A1c MFr Bld: 5.9 % — ABNORMAL HIGH (ref <5.7)
Mean Plasma Glucose: 123 mg/dL — ABNORMAL HIGH (ref <117)

## 2012-02-09 LAB — LIPID PANEL
HDL: 43 mg/dL (ref >39)
LDL Cholesterol: 110 mg/dL — ABNORMAL HIGH (ref 0–99)

## 2012-02-09 LAB — GLUCOSE, RANDOM: Glucose, Bld: 98 mg/dL (ref 70–99)

## 2012-02-09 NOTE — Addendum Note (Signed)
Addended by: Abner Greenspan on: 02/09/2012 09:49 AM   Modules accepted: Orders

## 2012-02-16 ENCOUNTER — Ambulatory Visit: Payer: Commercial Managed Care - PPO | Admitting: Family Medicine

## 2012-02-23 ENCOUNTER — Ambulatory Visit: Payer: Commercial Managed Care - PPO | Admitting: Family Medicine

## 2012-03-25 ENCOUNTER — Other Ambulatory Visit (HOSPITAL_COMMUNITY): Payer: Self-pay | Admitting: Gynecology

## 2012-03-25 DIAGNOSIS — Z139 Encounter for screening, unspecified: Secondary | ICD-10-CM

## 2012-04-05 ENCOUNTER — Ambulatory Visit (HOSPITAL_COMMUNITY)
Admission: RE | Admit: 2012-04-05 | Discharge: 2012-04-05 | Disposition: A | Discharge status: Home / Self Care | Payer: 59 | Source: Ambulatory Visit | Attending: Gynecology | Admitting: Gynecology

## 2012-04-05 DIAGNOSIS — Z1231 Encounter for screening mammogram for malignant neoplasm of breast: Secondary | ICD-10-CM | POA: Insufficient documentation

## 2012-04-05 DIAGNOSIS — Z139 Encounter for screening, unspecified: Secondary | ICD-10-CM

## 2012-06-27 ENCOUNTER — Other Ambulatory Visit: Payer: Self-pay | Admitting: Family Medicine

## 2012-06-28 LAB — LIPID PANEL
HDL: 67 mg/dL (ref >39)
LDL Cholesterol: 105 mg/dL — ABNORMAL HIGH (ref 0–99)
Triglycerides: 86 mg/dL (ref <150)
VLDL: 17 mg/dL (ref 0–40)

## 2012-06-28 LAB — HEMOGLOBIN A1C: Hgb A1c MFr Bld: 5.7 % — ABNORMAL HIGH (ref <5.7)

## 2012-07-01 ENCOUNTER — Ambulatory Visit: Payer: Commercial Managed Care - PPO | Admitting: Family Medicine

## 2012-07-10 DIAGNOSIS — R768 Other specified abnormal immunological findings in serum: Secondary | ICD-10-CM

## 2012-07-10 HISTORY — DX: Other specified abnormal immunological findings in serum: R76.8

## 2013-01-09 ENCOUNTER — Telehealth: Payer: Self-pay

## 2013-01-09 DIAGNOSIS — E669 Obesity, unspecified: Secondary | ICD-10-CM

## 2013-01-09 DIAGNOSIS — R7303 Prediabetes: Secondary | ICD-10-CM

## 2013-01-09 DIAGNOSIS — Z139 Encounter for screening, unspecified: Secondary | ICD-10-CM

## 2013-01-09 DIAGNOSIS — E785 Hyperlipidemia, unspecified: Secondary | ICD-10-CM

## 2013-01-09 NOTE — Telephone Encounter (Signed)
Opened for lab order.

## 2013-01-13 LAB — LIPID PANEL
LDL Cholesterol: 133 mg/dL — ABNORMAL HIGH (ref 0–99)
Triglycerides: 114 mg/dL (ref <150)
VLDL: 23 mg/dL (ref 0–40)

## 2013-01-13 LAB — COMPREHENSIVE METABOLIC PANEL
ALT: 18 U/L (ref 0–35)
AST: 17 U/L (ref 0–37)
Albumin: 4.5 g/dL (ref 3.5–5.2)
Alkaline Phosphatase: 79 U/L (ref 39–117)
Potassium: 5.7 mEq/L — ABNORMAL HIGH (ref 3.5–5.3)
Sodium: 143 mEq/L (ref 135–145)
Total Bilirubin: 0.9 mg/dL (ref 0.3–1.2)
Total Protein: 7.1 g/dL (ref 6.0–8.3)

## 2013-01-13 LAB — HEMOGLOBIN A1C
Hgb A1c MFr Bld: 5.8 % — ABNORMAL HIGH (ref <5.7)
Mean Plasma Glucose: 120 mg/dL — ABNORMAL HIGH (ref <117)

## 2013-01-13 LAB — CBC
HCT: 40.2 % (ref 36.0–46.0)
Hemoglobin: 14.2 g/dL (ref 12.0–15.0)
RBC: 4.71 MIL/uL (ref 3.87–5.11)

## 2013-01-13 LAB — TSH: TSH: 3.323 u[IU]/mL (ref 0.350–4.500)

## 2013-01-14 LAB — VITAMIN D 25 HYDROXY (VIT D DEFICIENCY, FRACTURES): Vit D, 25-Hydroxy: 36 ng/mL (ref 30–89)

## 2013-01-17 ENCOUNTER — Encounter: Payer: Self-pay | Admitting: Family Medicine

## 2013-01-23 ENCOUNTER — Encounter: Payer: Self-pay | Admitting: Family Medicine

## 2013-01-23 ENCOUNTER — Ambulatory Visit (INDEPENDENT_AMBULATORY_CARE_PROVIDER_SITE_OTHER): Payer: Commercial Managed Care - PPO | Admitting: Family Medicine

## 2013-01-23 VITALS — BP 120/78 | HR 82 | Resp 16 | Ht 60.0 in | Wt 161.2 lb

## 2013-01-23 DIAGNOSIS — E669 Obesity, unspecified: Secondary | ICD-10-CM

## 2013-01-23 DIAGNOSIS — E785 Hyperlipidemia, unspecified: Secondary | ICD-10-CM

## 2013-01-23 DIAGNOSIS — R7309 Other abnormal glucose: Secondary | ICD-10-CM

## 2013-01-23 DIAGNOSIS — Z23 Encounter for immunization: Secondary | ICD-10-CM

## 2013-01-23 DIAGNOSIS — G4700 Insomnia, unspecified: Secondary | ICD-10-CM

## 2013-01-23 DIAGNOSIS — Z2911 Encounter for prophylactic immunotherapy for respiratory syncytial virus (RSV): Secondary | ICD-10-CM

## 2013-01-23 DIAGNOSIS — R7303 Prediabetes: Secondary | ICD-10-CM

## 2013-01-23 DIAGNOSIS — E8881 Metabolic syndrome: Secondary | ICD-10-CM

## 2013-01-23 NOTE — Patient Instructions (Addendum)
F/U in mid December  Fasting lipid, HBA1C and chem 7 in December  3 to 5 days before f/u  Please commit to daily exercise for improved health and wellbeing  Please cut back on fried and fatty foods, red meats, butter and cheese, your total and bad cholesterol are slightly elevated, which increases your risk of heart disease and stroke   Weight loss goal of 8 to  10 pounds in next 6 months as we discussed  TdaP and zostavax today

## 2013-01-25 DIAGNOSIS — G4700 Insomnia, unspecified: Secondary | ICD-10-CM | POA: Insufficient documentation

## 2013-01-25 NOTE — Assessment & Plan Note (Addendum)
Deteriorated, pt is motivated to changing her diet, and starting daily execie to normalize her blood sugars once more, as she has done in the past

## 2013-01-25 NOTE — Progress Notes (Signed)
  Subjective:    Patient ID: Tracy Humphrey, female    DOB: 1950-12-20, 62 y.o.   MRN: [id number]  HPI Pt in for lab follow up and  review of her general health. 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 As I explain to her , these chronic currently mild conditions all need to be addressed, to improve her current health so that she remains as healthy as she has managed to be over the years. Poor sleep, and"high expectations" of herself, espescialy as it relates to work performance continue to be her major issues. 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 Denies sinus pressure, nasal congestion, ear pain or sore throat.No recent inner ear episodes, keeps meclizine on hand Denies chest congestion, productive cough or wheezing. Denies chest pains, palpitations and leg swelling Denies abdominal pain, nausea, vomiting,diarrhea or constipation.   Denies dysuria, frequency, hesitancy or incontinence. Denies joint pain, swelling and limitation in mobility. Denies headaches, seizures, numbness, or tingling. Denies skin break down or rash.        Objective:   Physical Exam Patient alert and oriented and in no cardiopulmonary distress.Looks tired  HEENT: No facial asymmetry, EOMI, n oropharynx pink and moist.  Neck supple  Chest: Clear to auscultation bilaterally.  CVS: S1, S2 no murmurs, no S3.  ABD: Soft non tender. Bowel sounds normal.  Ext: No edema  MS: Adequate ROM spine, shoulders, hips and knees.  Skin: Intact, no ulcerations or rash noted.  Psych: Fairly Good eyecontact,, blunted affect.  Memory intact   CNS: CN 2-12 intact, power, tone and sensation normal throughout.        Assessment & Plan:

## 2013-01-25 NOTE — Assessment & Plan Note (Signed)
Increased CV risk, hence needs to improve lipids, and blood sugar and lose weight

## 2013-01-25 NOTE — Assessment & Plan Note (Signed)
Has longstanding c/o difficulty with sleep, sleep hygiene discussed and reviewed. Anxiety and stress is contributing to worsening of symptom Uses OTC medication only at this time, no interest in prescription med.currently, wil commit to increased physical activity, commitment to exercise , which she finds  helpful

## 2013-01-25 NOTE — Assessment & Plan Note (Signed)
Improved. Pt applauded on succesful weight loss through lifestyle change, and encouraged to continue same. Weight loss goal set for the next several months. Reports that last Summer she was 20 pounds less however

## 2013-01-25 NOTE — Assessment & Plan Note (Signed)
Deteriorated, total and LDL have both increased, pt to change to low fat diet, and commit to regular exercise

## 2013-02-25 ENCOUNTER — Telehealth: Payer: Self-pay | Admitting: Family Medicine

## 2013-02-25 MED ORDER — TEMAZEPAM 7.5 MG PO CAPS: 7.5000 mg | ORAL_CAPSULE | Freq: Every evening | ORAL | Status: DC | PRN

## 2013-02-25 NOTE — Telephone Encounter (Signed)
Longstanding problem of poor sleep. Unable to "shut her mind off" despite good sleep hygiene. Has resisted med management to date and is cautious about this, which is good. She is willing to "try" low dose restoril, I did explain that she should not be worried about weight gain on this low dose , espescialy if healthy food choices are made, with portion control. Pls send printed script to pharmacy, and leave info re sleep hygiene with her (envelope on her desk or in her "box" is approprite, let her know med should be ready for pick up tomorrow also pls

## 2013-02-28 ENCOUNTER — Telehealth: Payer: Self-pay | Admitting: Family Medicine

## 2013-02-28 MED ORDER — BENZOYL PEROXIDE 2.5 % EX GEL: CUTANEOUS | Status: AC

## 2013-02-28 NOTE — Telephone Encounter (Signed)
Pt requests refill on 2.5% benzoyl peroxide gel which she uses on her face as needed, for red areas. Hss had the original script form derm reportedly for approx 20 years. Advised I am able to prescribe this for her and will do so

## 2013-03-26 ENCOUNTER — Telehealth: Payer: Self-pay | Admitting: Family Medicine

## 2013-03-26 ENCOUNTER — Other Ambulatory Visit: Payer: Self-pay

## 2013-03-26 MED ORDER — ALPRAZOLAM 0.5 MG PO TABS: ORAL_TABLET | ORAL | Status: AC

## 2013-03-26 MED ORDER — TEMAZEPAM 15 MG PO CAPS: 15.0000 mg | ORAL_CAPSULE | Freq: Every evening | ORAL | Status: DC | PRN

## 2013-03-26 MED ORDER — ALPRAZOLAM 0.5 MG PO TABS: ORAL_TABLET | ORAL | Status: DC

## 2013-03-26 NOTE — Telephone Encounter (Signed)
Pt continues to have difficulty sleeping, mind unable to shut down , despite doubling restoril to 15mg  tablet  Will attempt a short trial of xanax to take with the 15mg  dose at bedtime, she may need help with thera[py to cope with her currently stressful work environment

## 2013-03-26 NOTE — Telephone Encounter (Signed)
Med to be sent to [facility]

## 2013-04-13 DIAGNOSIS — R002 Palpitations: Secondary | ICD-10-CM

## 2013-04-14 ENCOUNTER — Other Ambulatory Visit (HOSPITAL_COMMUNITY): Payer: Self-pay | Admitting: Gynecology

## 2013-04-14 DIAGNOSIS — Z139 Encounter for screening, unspecified: Secondary | ICD-10-CM

## 2013-04-22 ENCOUNTER — Ambulatory Visit (HOSPITAL_COMMUNITY)
Admission: RE | Admit: 2013-04-22 | Discharge: 2013-04-22 | Disposition: A | Discharge status: Home / Self Care | Payer: 59 | Source: Ambulatory Visit | Attending: Gynecology | Admitting: Gynecology

## 2013-04-22 DIAGNOSIS — Z139 Encounter for screening, unspecified: Secondary | ICD-10-CM

## 2013-04-22 DIAGNOSIS — Z1231 Encounter for screening mammogram for malignant neoplasm of breast: Secondary | ICD-10-CM | POA: Insufficient documentation

## 2013-05-15 ENCOUNTER — Other Ambulatory Visit: Payer: Self-pay

## 2013-06-09 ENCOUNTER — Ambulatory Visit (INDEPENDENT_AMBULATORY_CARE_PROVIDER_SITE_OTHER): Payer: 59 | Admitting: Family Medicine

## 2013-06-09 ENCOUNTER — Encounter: Payer: Self-pay | Admitting: Family Medicine

## 2013-06-09 VITALS — BP 138/80 | HR 95 | Resp 16 | Ht 60.0 in | Wt 172.4 lb

## 2013-06-09 DIAGNOSIS — R7303 Prediabetes: Secondary | ICD-10-CM

## 2013-06-09 DIAGNOSIS — K3189 Other diseases of stomach and duodenum: Secondary | ICD-10-CM

## 2013-06-09 DIAGNOSIS — R1013 Epigastric pain: Secondary | ICD-10-CM

## 2013-06-09 DIAGNOSIS — E669 Obesity, unspecified: Secondary | ICD-10-CM

## 2013-06-09 DIAGNOSIS — E8881 Metabolic syndrome: Secondary | ICD-10-CM

## 2013-06-09 DIAGNOSIS — R894 Abnormal immunological findings in specimens from other organs, systems and tissues: Secondary | ICD-10-CM

## 2013-06-09 DIAGNOSIS — R768 Other specified abnormal immunological findings in serum: Secondary | ICD-10-CM

## 2013-06-09 DIAGNOSIS — G4700 Insomnia, unspecified: Secondary | ICD-10-CM

## 2013-06-09 DIAGNOSIS — F439 Reaction to severe stress, unspecified: Secondary | ICD-10-CM

## 2013-06-09 DIAGNOSIS — R7309 Other abnormal glucose: Secondary | ICD-10-CM

## 2013-06-09 DIAGNOSIS — E785 Hyperlipidemia, unspecified: Secondary | ICD-10-CM

## 2013-06-09 DIAGNOSIS — R002 Palpitations: Secondary | ICD-10-CM

## 2013-06-09 DIAGNOSIS — I519 Heart disease, unspecified: Secondary | ICD-10-CM

## 2013-06-09 DIAGNOSIS — Z8249 Family history of ischemic heart disease and other diseases of the circulatory system: Secondary | ICD-10-CM

## 2013-06-09 DIAGNOSIS — Z733 Stress, not elsewhere classified: Secondary | ICD-10-CM

## 2013-06-09 HISTORY — DX: Heart disease, unspecified: I51.9

## 2013-06-09 MED ORDER — PANTOPRAZOLE SODIUM 20 MG PO TBEC: 20.0000 mg | DELAYED_RELEASE_TABLET | Freq: Every day | ORAL | Status: DC
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
## 2013-06-09 NOTE — Patient Instructions (Addendum)
Cancel December visit, f/u in 4 month, call if you need me before  Please get labs this week, H pylori test, lipid, chem 7, and  tSH if none in 1 year   Your EKG is reassuring , however, with your family history, and this new sensation of "fluttering " I am still requesting a cardiology evaluation  I believe that your gallbladder needs to be tested and have referred you for an Ultra sound, if you have no gallstones, the one additional test of function is a HIDA scan which we will need to have done. Blood tests this week will see if you have been exposed to   Bacteria  which may cause heartburn, and I am going ahead and starting you on specific reflux medication pantoprazole one daily. Please reduce caffeine intake.  You are referred to Dr Kendell Bane, and I will also send him a message about you Helicobacter Pylori Antibodies Test This is a blood test which looks for a germ called Helicobacter pylori. This can also be diagnosed by a breath test or a microscopic examination of a portion (biopsy) of the small bowel. H. pylori is a germ that is found in the cells that line the stomach. It is a risk factor for stomach and small bowel ulcers, long standing inflammation of the lining of the stomach, or even ulcers that may occur in the esophagus (the canal that runs from the mouth to the stomach). This bacterium is also a factor in stomach cancer. The amount of the bacteria is found in about 10% of healthy persons younger than 64 years of age and the amount of the bacteria increases with age. Most persons with these bacteria have no symptoms; however, it is thought that when these bacteria cause ulcers, antibiotic medications can be used to help eliminate or reduce the problem.  PREPARATION FOR TEST No preparation or fasting is necessary. NORMAL FINDINGS Negative (H-pylori bacteria not present) Ranges for normal findingsmay vary among different laboratories and hospitals. You should always check with your  doctor after having lab work or other tests done to discuss the meaning of your test results and whether or not your values are considered within normal limits. MEANING OF TEST  Your caregiver will go over the test results with you and discuss the importance and meaning of your results, as well as treatment options and the need for additional tests if necessary. It is your responsibility to obtain your test results. Ask the lab or department performing the test when and how you will get your results. OBTAINING THE TEST RESULTS It is your responsibility to obtain your test results. Ask the lab or department performing the test when and how you will get your results. Document Released: 07/20/2004 Document Revised: 09/18/2011 Document Reviewed: 06/06/2008 [facility] Patient Information 2014 [place], [place]. Indigestion  Indigestion is discomfort in the upper belly (abdomen). HOME CARE  Avoid foods and drinks that make your problems worse. You may want to avoid:  Caffeine and alcohol.  Chocolate.  Peppermint.  Garlic and onions.  Spicy foods.  Citrus fruits, such as oranges, lemons, or limes.  Tomato-based foods such as sauce, chili, salsa, and pizza.  Fried and fatty foods.  Avoid eating for 3 hours before your bedtime.  Eat small meals instead of large meals more often.  Stop smoking if you smoke.  Maintain a healthy weight.  Wear loose-fitting clothing. Do not wear anything tight around your waist.  Raise the head of your bed 4 to 8 inches  with wood blocks.  Only take medicines as told by your doctor.  Do not take aspirin or ibuprofen. GET HELP RIGHT AWAY IF:  You are not better after 2 days.  You have chest pain that goes into your neck, arms, back, jaw, or upper belly.  You have trouble swallowing.  You keep throwing up (vomiting).  You have black or bloody poop (stool).  You have a fever.  You have trouble breathing, you feel dizzy, or you pass out  (faint).  You are sweating a lot.  You have severe belly pain.  You lose weight without trying. MAKE SURE YOU:  Understand these instructions.  Will watch your condition.  Will get help right away if you are not doing well or get worse. Document Released: 07/29/2010 Document Revised: 09/18/2011 Document Reviewed: 02/08/2011 [facility] Patient Information 2014 [place], [place].

## 2013-06-10 ENCOUNTER — Other Ambulatory Visit (HOSPITAL_COMMUNITY): Payer: 59

## 2013-06-11 ENCOUNTER — Ambulatory Visit (INDEPENDENT_AMBULATORY_CARE_PROVIDER_SITE_OTHER): Payer: 59 | Admitting: Internal Medicine

## 2013-06-11 ENCOUNTER — Encounter: Payer: Self-pay | Admitting: Internal Medicine

## 2013-06-11 ENCOUNTER — Telehealth: Payer: Self-pay | Admitting: Family Medicine

## 2013-06-11 VITALS — BP 126/70 | HR 74 | Temp 98.6°F | Ht 60.0 in | Wt 171.0 lb

## 2013-06-11 DIAGNOSIS — R768 Other specified abnormal immunological findings in serum: Secondary | ICD-10-CM | POA: Insufficient documentation

## 2013-06-11 DIAGNOSIS — K219 Gastro-esophageal reflux disease without esophagitis: Secondary | ICD-10-CM

## 2013-06-11 LAB — H. PYLORI ANTIBODY, IGG: H Pylori IgG: >8 {ISR} — ABNORMAL HIGH

## 2013-06-11 MED ORDER — PANTOPRAZOLE SODIUM 40 MG PO TBEC: 40.0000 mg | DELAYED_RELEASE_TABLET | Freq: Two times a day (BID) | ORAL | Status: DC

## 2013-06-11 MED ORDER — BIS SUBCIT-METRONID-TETRACYC 140-125-125 MG PO CAPS: 3.0000 | ORAL_CAPSULE | Freq: Three times a day (TID) | ORAL | Status: DC

## 2013-06-11 NOTE — Patient Instructions (Signed)
GERD information  Trial of Dexilant 60 mg daily - 2 week sample supply  Stool for occult blood testing  You may yet benefit from a diagnostic EGD;   Will review cardiology consult which is pending.  Telephone report in 2 weeks  Loose 20 pounds over the next 12 months

## 2013-06-11 NOTE — Progress Notes (Signed)
Primary Care Physician:  Syliva Overman, MD Primary Gastroenterologist:  Dr.   Pre-Procedure History & Physical: HPI:  Tracy Humphrey is a 62 y.o. female here for further evaluation of a week or so history of "fluttering" in her chest. Has taken 2 TUMS every day since age 24 at the direction of her gynecologist. Stop taking TUMS just couple days ago and did experience some vague symptoms which he describes as heartburn but prior to this period, she denies any sort of indigestion, heartburn reflux. Has not had any nausea, vomiting or abdominal pain. She had negative cardiac stress test years ago. She is scheduled to see the cardiologist in the future. Recent EKG was normal. She's gained 20 pounds in the past year. Bowel function is described as normal. No melena or hematochezia. No family history of GI malignancy. Normal screening colonoscopy in 2016; she's here for average risk screening 2016.  She denies a prior history of peptic ulcer disease. Chest or her upper GI tract evaluated. She tells me that Dr. Lodema Hong has ordered an ultrasound. Also, H. pylori serologies were drawn through her office. Recently given a prescription for pantoprazole 20 mg daily Has not started that prescription as of yet.  Past Medical History  Diagnosis Date  . Obesity 2007     prior to this had no weight issue     Past Surgical History  Procedure Laterality Date  . Cesarean section  29 years ago   . Right foot surgery 2003. 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 Colonoscopy  02/13/2005    Dr. Jena Gauss- normal rectum, normal colon    Prior to Admission medications   Medication Sig Start Date End Date Taking? Authorizing Provider  aspirin (ASPIRIN LOW DOSE) 81 MG EC tablet Take 81 mg by mouth daily.     Yes Historical Provider, MD  calcium carbonate (TUMS - DOSED IN MG ELEMENTAL CALCIUM) 500 MG chewable tablet Chew 2 tablets by mouth daily.     Yes Historical Provider, MD   Multiple Vitamin (MULTIVITAMIN PO) Take by mouth.     Yes Historical Provider, MD  Benzoyl Peroxide 2.5 % gel Apply once daily to affected area(s) , as needed 02/28/13 02/28/14  Kerri Perches, MD  pantoprazole (PROTONIX) 20 MG tablet Take 1 tablet (20 mg total) by mouth daily. 06/09/13 07/10/13  Kerri Perches, MD  temazepam (RESTORIL) 15 MG capsule Take 1 capsule (15 mg total) by mouth at bedtime as needed for sleep. 03/26/13   Kerri Perches, MD    Allergies as of 06/11/2013 - Review Complete 06/11/2013  Allergen Reaction Noted  . Prednisone  03/08/2011  . Penicillins  06/21/2009    Family History  Problem Relation Age of Onset  . Dementia Mother 64  . Hypertension Mother   . Hypertension Father   . Heart disease Father   . Diabetes Father   . Hypertension Sister   . Coronary artery disease Sister 68  . Hypertension Brother 1  . Alcohol abuse Brother     History   Social History  . Marital Status: Married    Spouse Name: N/A    Number of Children: N/A  . Years of Education: N/A   Occupational History  . [employer]     Social History Main Topics  . Smoking status: Never Smoker   . Smokeless tobacco: Not on file     Comment: Never smoke  . Alcohol Use: No  .  Drug Use: No  . Sexual Activity: Not on file   Other Topics Concern  . Not on file   Social History Narrative  . No narrative on file    Review of Systems: See HPI, otherwise negative ROS  Physical Exam: BP 126/70  Pulse 74  Temp(Src) 98.6 F (37 C) (Oral)  Ht 5' (1.524 m)  Wt 171 lb (77.565 kg)  BMI 33.40 kg/m2 General:   Alert,  Well-developed, well-nourished, pleasant and cooperative in NAD Skin:  Intact without significant lesions or rashes. Eyes:  Sclera clear, no icterus.   Conjunctiva pink. Ears:  Normal auditory acuity. Nose:  No deformity, discharge,  or lesions. Mouth:  No deformity or lesions. Neck:  Supple; no masses or thyromegaly. No significant cervical adenopathy. Lungs:   Clear throughout to auscultation.   No wheezes, crackles, or rhonchi. No acute distress. Heart:  Regular rate with occasional ectopic beat noted ; no murmurs, clicks, rubs,  or gallops. Abdomen: Non-distended, normal bowel sounds.  Soft and nontender without appreciable mass or hepatosplenomegaly.  Pulses:  Normal pulses noted. Extremities:  Without clubbing or edema.  Impression :  Very pleasant 62 year old lady with recent history of intermittent "fluttering" in her chest. Has been on TUMS for years for the calcium component. Stopped  taking them recently and perceives some vague symptoms consistent with reflux. However, prior to just recently, she really hasn't had any upper GI tract symptoms. She has gained 20 pounds over the past one year which could certainly predispose one to reflux. She describes "fluttering" and was noted to have some ectopic beats on physical exam today. She has no alarm GI symptoms at this time. There is nothing in her history to suggest occult biliary or acid peptic disease otherwise.   Recommendations:  GERD information  Trial of Dexilant 60 mg daily - 2 week sample supply  Stool for occult blood testing  No need for urgent EGD at this time, however, patient may yet benefit from a diagnostic EGD;   Will review cardiology consult and ultrasound which is pending.  Telephone report in 2 weeks  Loose 20 pounds over the next 12 months  Further recommendations to follow.

## 2013-06-11 NOTE — Telephone Encounter (Signed)
Pls call pharmacy [facility] , let them know do not dispense the protonix 20mg  tablet, instead 40mg  twice daily for 10 days. Pls call pt [name], let her know I spoke with Dr Kendell Bane, we agree on the treatment together, she will do starting tomorrow the protonix 40mg  twice dail y for 10 days, with pylera for 10 days. He states she must go through taking all tablets as prescribed so the infection is treated fully so she does not get resistant  To treatment. After she has finished the treatment she is to take the dexilant one daily that he gave her until she sees him in follow up.  He says that the bacteria mostlikely has been present for a LONG time, feco oral transmission, (stool to mouth...kids messing around in childhood)

## 2013-06-12 ENCOUNTER — Ambulatory Visit (INDEPENDENT_AMBULATORY_CARE_PROVIDER_SITE_OTHER): Payer: 59 | Admitting: Internal Medicine

## 2013-06-12 DIAGNOSIS — K279 Peptic ulcer, site unspecified, unspecified as acute or chronic, without hemorrhage or perforation: Secondary | ICD-10-CM

## 2013-06-12 LAB — IFOBT (OCCULT BLOOD): IFOBT: NEGATIVE

## 2013-06-12 LAB — LIPID PANEL
LDL Cholesterol: 136 mg/dL — ABNORMAL HIGH (ref 0–99)
Triglycerides: 77 mg/dL (ref <150)
VLDL: 15 mg/dL (ref 0–40)

## 2013-06-12 LAB — BASIC METABOLIC PANEL
Calcium: 9.6 mg/dL (ref 8.4–10.5)
Glucose, Bld: 101 mg/dL — ABNORMAL HIGH (ref 70–99)
Sodium: 141 mEq/L (ref 135–145)

## 2013-06-12 LAB — HEMOGLOBIN A1C
Hgb A1c MFr Bld: 5.9 % — ABNORMAL HIGH (ref <5.7)
Mean Plasma Glucose: 123 mg/dL — ABNORMAL HIGH (ref <117)

## 2013-06-12 NOTE — Progress Notes (Signed)
Pt returned ifobt and it was negative.  

## 2013-06-12 NOTE — Telephone Encounter (Signed)
Patient aware.

## 2013-06-13 ENCOUNTER — Encounter: Payer: Self-pay | Admitting: Cardiology

## 2013-06-13 ENCOUNTER — Ambulatory Visit (INDEPENDENT_AMBULATORY_CARE_PROVIDER_SITE_OTHER): Payer: 59 | Admitting: Cardiology

## 2013-06-13 ENCOUNTER — Ambulatory Visit (HOSPITAL_COMMUNITY)
Admission: RE | Admit: 2013-06-13 | Discharge: 2013-06-13 | Disposition: A | Discharge status: Home / Self Care | Payer: 59 | Source: Ambulatory Visit | Attending: Family Medicine | Admitting: Family Medicine

## 2013-06-13 ENCOUNTER — Ambulatory Visit (HOSPITAL_COMMUNITY)
Admission: RE | Admit: 2013-06-13 | Discharge: 2013-06-13 | Disposition: A | Discharge status: Home / Self Care | Payer: 59 | Source: Ambulatory Visit | Attending: Cardiology | Admitting: Cardiology

## 2013-06-13 VITALS — BP 118/78 | HR 86 | Ht 60.0 in | Wt 173.0 lb

## 2013-06-13 DIAGNOSIS — R142 Eructation: Secondary | ICD-10-CM | POA: Insufficient documentation

## 2013-06-13 DIAGNOSIS — K3189 Other diseases of stomach and duodenum: Secondary | ICD-10-CM | POA: Insufficient documentation

## 2013-06-13 DIAGNOSIS — R1013 Epigastric pain: Secondary | ICD-10-CM

## 2013-06-13 DIAGNOSIS — R002 Palpitations: Secondary | ICD-10-CM | POA: Insufficient documentation

## 2013-06-13 DIAGNOSIS — R141 Gas pain: Secondary | ICD-10-CM | POA: Insufficient documentation

## 2013-06-13 MED ORDER — FLUCONAZOLE 150 MG PO TABS: ORAL_TABLET | ORAL | Status: AC

## 2013-06-13 NOTE — Progress Notes (Signed)
48 hour Holter Monitor in progress, ends @ 1:27 pm on 06-15-13

## 2013-06-13 NOTE — Assessment & Plan Note (Signed)
Recent ECG showed a single PVC. She has no history of cardiac ischemic or structural disease, no exertional symptoms at this time. She is also undergoing GI workup as outlined. We will plan a screening 48 hour Holter monitor to exclude any obvious arrhythmias and plan to inform her of the results.

## 2013-06-13 NOTE — Patient Instructions (Addendum)
Your physician recommends that you schedule a follow-up appointment in: TO BE DETERMINED  Your physician has recommended that you wear a holter monitor. Holter monitors are medical devices that record the heart's electrical activity. Doctors most often use these monitors to diagnose arrhythmias. Arrhythmias are problems with the speed or rhythm of the heartbeat. The monitor is a small, portable device. You can wear one while you do your normal daily activities. This is usually used to diagnose what is causing palpitations/syncope (passing out).  WE WILL CALL YOU WITH YOUR TEST RESULTS/INSTRUCTIONS/NEXT STEPS ONCE RECEIVED BY THE PROVIDER

## 2013-06-13 NOTE — Progress Notes (Signed)
Clinical Summary Ms. Sheen is a 62 y.o.female referred for cardiology consultation by Dr. Lodema Hong. History reviewed. She reports a feeling of "bubbles and fluttering" in her epigastric and midsternal area over the last several weeks. She is concurrently undergoing GI evaluation with recent documentation of Helicobacter pylori on screening blood work. She is undergoing treatment for this and will have an EGD soon with Dr. Jena Gauss.  Recent lab work reviewed findings cholesterol 200, triglycerides 77, HDL 49, LDL 136, hemoglobin A1c 5.9, potassium 4.7, BUN 17, creatinine 0.7.  ECG reviewed showing sinus rhythm with single PVC, otherwise normal. She has had no dizziness or syncope, no exertional chest pain or breathlessness. Symptoms occur every day to every other day.   Allergies  Allergen Reactions  . Prednisone     All steroids  . Penicillins     Current Outpatient Prescriptions  Medication Sig Dispense Refill  . aspirin (ASPIRIN LOW DOSE) 81 MG EC tablet Take 81 mg by mouth daily.        . Benzoyl Peroxide 2.5 % gel Apply once daily to affected area(s) , as needed  60 g  0  . bismuth-metronidazole-tetracycline (PYLERA) 140-125-125 MG per capsule Take 3 capsules by mouth 4 (four) times daily -  before meals and at bedtime.  120 capsule  0  . calcium carbonate (TUMS - DOSED IN MG ELEMENTAL CALCIUM) 500 MG chewable tablet Chew 2 tablets by mouth daily.        . Multiple Vitamin (MULTIVITAMIN PO) Take by mouth.        . pantoprazole (PROTONIX) 40 MG tablet Take 1 tablet (40 mg total) by mouth 2 (two) times daily.  20 tablet  0  . temazepam (RESTORIL) 15 MG capsule Take 1 capsule (15 mg total) by mouth at bedtime as needed for sleep.  90 capsule  1   No current facility-administered medications for this visit.    Past Medical History  Diagnosis Date  . Obesity 2007     Prior to this had no weight issue   . GERD (gastroesophageal reflux disease)     Past Surgical History  Procedure  Laterality Date  . Cesarean section  29 years ago   . Right foot surgery 2003. 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 Colonoscopy  02/13/2005    Dr. Jena Gauss- normal rectum, normal colon    Family History  Problem Relation Age of Onset  . Dementia Mother 9  . Hypertension Mother   . Hypertension Father   . Heart disease Father   . Diabetes Father   . Hypertension Sister   . Coronary artery disease Sister 25  . Hypertension Brother 62  . Alcohol abuse Brother     Social History Ms. Finkbiner reports that she has never smoked. She does not have any smokeless tobacco history on file. Ms. Hunnicutt reports that she does not drink alcohol.  Review of Systems Negative except as outlined.  Physical Examination Filed Vitals:   06/13/13 1123  BP: 118/78  Pulse: 86   Filed Weights   06/13/13 1123  Weight: 173 lb (78.472 kg)   Patient appears comfortable at rest. HEENT: Conjunctiva and lids normal, oropharynx clear. Neck: Supple, no elevated JVP or carotid bruits, no thyromegaly. Lungs: Clear to auscultation, nonlabored breathing at rest. Cardiac: Regular rate and rhythm, no S3 or significant systolic murmur, no pericardial rub. Extremities: No pitting edema, distal pulses 2+. Skin: Warm and dry. Neuropsychiatric:  Alert and oriented x3, affect grossly appropriate.   Problem List and Plan   Palpitations Recent ECG showed a single PVC. She has no history of cardiac ischemic or structural disease, no exertional symptoms at this time. She is also undergoing GI workup as outlined. We will plan a screening 48 hour Holter monitor to exclude any obvious arrhythmias and plan to inform her of the results.    Jonelle Sidle, M.D., F.A.C.C.

## 2013-06-15 DIAGNOSIS — E663 Overweight: Secondary | ICD-10-CM | POA: Insufficient documentation

## 2013-06-15 DIAGNOSIS — F439 Reaction to severe stress, unspecified: Secondary | ICD-10-CM | POA: Insufficient documentation

## 2013-06-15 NOTE — Assessment & Plan Note (Signed)
Deteriorated Patient educated about the importance of limiting  Carbohydrate intake , the need to commit to daily physical activity for a minimum of 30 minutes , and to commit weight loss. The fact that changes in all these areas will reduce or eliminate all together the development of diabetes is stressed.    

## 2013-06-15 NOTE — Assessment & Plan Note (Signed)
New onset intermittent palpitations. EKG in office shows few PVC's.No other abnormality Will refer to cardiology for further eval

## 2013-06-15 NOTE — Assessment & Plan Note (Signed)
positive test , will treat , pt education done, and she is being co managed by GI with regard to this

## 2013-06-15 NOTE — Assessment & Plan Note (Signed)
Good Sleep hygiene , and reduced dependence on medication. With regular exercise and attempts to reduce anxiety and stress in her life, this will improve

## 2013-06-15 NOTE — Assessment & Plan Note (Signed)
Deteriorated. Patient re-educated about  the importance of commitment to a  minimum of 150 minutes of exercise per week. The importance of healthy food choices with portion control discussed. Encouraged to start a food diary, count calories and to consider  joining a support group. Sample diet sheets offered. Goals set by the patient for the next several months.    

## 2013-06-15 NOTE — Assessment & Plan Note (Signed)
Deteriorated, as pt has gained weight, and her HBa1C is elevated. The importance of consistently working on improving this is stressed so that her CV risk is reduced

## 2013-06-15 NOTE — Assessment & Plan Note (Signed)
Increased personal and professional stress x 18 month, would benefit from therapy, but no interest at this time. Overall has coped very well using her own coping skills, and there is overall improvement in level of anxiety  And it's impact on her ability to function Will re address at next visit, should complete PHQ9 also

## 2013-06-15 NOTE — Assessment & Plan Note (Addendum)
Refer for GBUS to r/o gall stones, also check H pylori

## 2013-06-15 NOTE — Assessment & Plan Note (Signed)
Elevated LDL though overall improved. Hyperlipidemia:Low fat diet discussed and encouraged.

## 2013-06-16 ENCOUNTER — Other Ambulatory Visit: Payer: Self-pay | Admitting: Internal Medicine

## 2013-06-16 ENCOUNTER — Telehealth: Payer: Self-pay | Admitting: Internal Medicine

## 2013-06-16 DIAGNOSIS — K219 Gastro-esophageal reflux disease without esophagitis: Secondary | ICD-10-CM

## 2013-06-16 NOTE — Telephone Encounter (Signed)
Patient is scheduled for EGD w/RMR on 12/18 at 8:00 am and she is aware instructions have been mailed to her

## 2013-06-16 NOTE — Telephone Encounter (Signed)
Message copied by Glendora Score on Mon Jun 16, 2013  8:55 AM ------      Message from: Jennings Books      Created: Fri Jun 13, 2013 11:34 AM                   ----- Message -----         From: Corbin Ade, MD         Sent: 06/13/2013   9:55 AM           To: Milinda Antis, #            Please schedule a diagnostic EGD on December 17, 18th or 19th (probably not on an office today) ------

## 2013-06-17 ENCOUNTER — Encounter (HOSPITAL_COMMUNITY): Payer: Self-pay | Admitting: Pharmacy Technician

## 2013-06-18 ENCOUNTER — Encounter: Payer: Self-pay | Admitting: Cardiology

## 2013-06-18 ENCOUNTER — Encounter: Payer: Self-pay | Admitting: Internal Medicine

## 2013-06-20 ENCOUNTER — Ambulatory Visit (HOSPITAL_COMMUNITY)
Admission: RE | Admit: 2013-06-20 | Discharge: 2013-06-20 | Disposition: A | Discharge status: Home / Self Care | Payer: 59 | Source: Ambulatory Visit | Attending: Cardiology | Admitting: Cardiology

## 2013-06-20 ENCOUNTER — Other Ambulatory Visit (HOSPITAL_COMMUNITY): Payer: Self-pay | Admitting: Cardiology

## 2013-06-20 DIAGNOSIS — R002 Palpitations: Secondary | ICD-10-CM | POA: Insufficient documentation

## 2013-06-20 DIAGNOSIS — Z6833 Body mass index (BMI) 33.0-33.9, adult: Secondary | ICD-10-CM | POA: Insufficient documentation

## 2013-06-20 DIAGNOSIS — I517 Cardiomegaly: Secondary | ICD-10-CM

## 2013-06-20 DIAGNOSIS — E669 Obesity, unspecified: Secondary | ICD-10-CM | POA: Insufficient documentation

## 2013-06-20 NOTE — Progress Notes (Signed)
*  PRELIMINARY RESULTS* Echocardiogram 2D Echocardiogram has been performed.  Guadalupe Nickless 06/20/2013, 11:02 AM

## 2013-06-23 ENCOUNTER — Encounter: Payer: Self-pay | Admitting: Internal Medicine

## 2013-06-23 ENCOUNTER — Encounter: Payer: Self-pay | Admitting: Cardiology

## 2013-06-25 ENCOUNTER — Ambulatory Visit: Payer: Commercial Managed Care - PPO | Admitting: Family Medicine

## 2013-06-26 ENCOUNTER — Encounter (HOSPITAL_COMMUNITY): Payer: Self-pay | Admitting: *Deleted

## 2013-06-26 ENCOUNTER — Encounter (HOSPITAL_COMMUNITY)
Admission: RE | Disposition: A | Discharge status: Home / Self Care | Payer: Self-pay | Source: Ambulatory Visit | Attending: Internal Medicine

## 2013-06-26 ENCOUNTER — Ambulatory Visit (HOSPITAL_COMMUNITY)
Admission: RE | Admit: 2013-06-26 | Discharge: 2013-06-26 | Disposition: A | Discharge status: Home / Self Care | Payer: 59 | Source: Ambulatory Visit | Attending: Internal Medicine | Admitting: Internal Medicine

## 2013-06-26 DIAGNOSIS — Z7982 Long term (current) use of aspirin: Secondary | ICD-10-CM | POA: Insufficient documentation

## 2013-06-26 DIAGNOSIS — R1013 Epigastric pain: Secondary | ICD-10-CM

## 2013-06-26 DIAGNOSIS — A048 Other specified bacterial intestinal infections: Secondary | ICD-10-CM | POA: Insufficient documentation

## 2013-06-26 DIAGNOSIS — K2970 Gastritis, unspecified, without bleeding: Secondary | ICD-10-CM

## 2013-06-26 DIAGNOSIS — K219 Gastro-esophageal reflux disease without esophagitis: Secondary | ICD-10-CM

## 2013-06-26 DIAGNOSIS — K228 Other specified diseases of esophagus: Secondary | ICD-10-CM

## 2013-06-26 DIAGNOSIS — K2990 Gastroduodenitis, unspecified, without bleeding: Secondary | ICD-10-CM

## 2013-06-26 DIAGNOSIS — K2940 Chronic atrophic gastritis without bleeding: Secondary | ICD-10-CM | POA: Insufficient documentation

## 2013-06-26 HISTORY — PX: ESOPHAGOGASTRODUODENOSCOPY: SHX5428

## 2013-06-26 SURGERY — EGD (ESOPHAGOGASTRODUODENOSCOPY)
Anesthesia: Moderate Sedation

## 2013-06-26 MED ORDER — MEPERIDINE HCL 100 MG/ML IJ SOLN
INTRAMUSCULAR | Status: DC | PRN
  Administered 2013-06-26: 50 mg via INTRAVENOUS
  Administered 2013-06-26 (×2): 25 mg via INTRAVENOUS

## 2013-06-26 MED ORDER — ONDANSETRON HCL 4 MG/2ML IJ SOLN
INTRAMUSCULAR | Status: DC | PRN
  Administered 2013-06-26: 4 mg via INTRAMUSCULAR

## 2013-06-26 MED ORDER — MEPERIDINE HCL 100 MG/ML IJ SOLN
INTRAMUSCULAR | Status: AC
  Filled 2013-06-26: qty 2

## 2013-06-26 MED ORDER — SODIUM CHLORIDE 0.9 % IV SOLN
INTRAVENOUS | Status: DC
  Administered 2013-06-26: 1000 mL via INTRAVENOUS

## 2013-06-26 MED ORDER — STERILE WATER FOR IRRIGATION IR SOLN
Status: DC | PRN
  Administered 2013-06-26: 08:00:00

## 2013-06-26 MED ORDER — BUTAMBEN-TETRACAINE-BENZOCAINE 2-2-14 % EX AERO
INHALATION_SPRAY | CUTANEOUS | Status: DC | PRN
  Administered 2013-06-26: 2 via TOPICAL

## 2013-06-26 MED ORDER — MIDAZOLAM HCL 5 MG/5ML IJ SOLN
INTRAMUSCULAR | Status: AC
  Filled 2013-06-26: qty 10

## 2013-06-26 MED ORDER — MIDAZOLAM HCL 5 MG/5ML IJ SOLN
INTRAMUSCULAR | Status: DC | PRN
  Administered 2013-06-26: 2 mg via INTRAVENOUS
  Administered 2013-06-26: 1 mg via INTRAVENOUS
  Administered 2013-06-26: 2 mg via INTRAVENOUS

## 2013-06-26 MED ORDER — ONDANSETRON HCL 4 MG/2ML IJ SOLN
INTRAMUSCULAR | Status: AC
  Filled 2013-06-26: qty 2

## 2013-06-26 NOTE — Op Note (Signed)
[facility] [address] [place], [address]   ENDOSCOPY PROCEDURE REPORT  PATIENT: Tracy Humphrey, Tracy Humphrey  MR#: [id number] BIRTHDATE: [birth year] , 62  yrs. old GENDER: Female ENDOSCOPIST: R.  Roetta Sessions, MD FACP FACG REFERRED BY:  Syliva Overman, M.D. PROCEDURE DATE:  06/26/2013 PROCEDURE:     EGD with esophageal and gastric biopsy  INDICATIONS:    Vague epigastric discomfort, predominantly postprandially. Patient describes as "bubbling". Negative ultrasound. Finished treatment for H. pylori recently. LFTs normal earlier this year  INFORMED CONSENT:   The risks, benefits, limitations, alternatives and imponderables have been discussed.  The potential for biopsy, esophogeal dilation, etc. have also been reviewed.  Questions have been answered.  All parties agreeable.  Please see the history and physical in the medical record for more information.  MEDICATIONS:  Versed 5 mg IV and Demerol 100 mg IV in divided doses. Zofran 4 mg IV. Cetacaine spray.  DESCRIPTION OF PROCEDURE:   The EG-[id number] ([id number])  endoscope was introduced through the mouth and advanced to the second portion of the duodenum without difficulty or limitations.  The mucosal surfaces were surveyed very carefully during advancement of the scope and upon withdrawal.  Retroflexion view of the proximal stomach and esophagogastric junction was performed.      FINDINGS:  2 cm "tongue" of salmon-colored epithelium coming up into the distal esophagus. Please see image 8. No esophagitis no tumor. Stomach empty. Examination the gastric mucosa including a retroflexion  view of the proximal stomach and esophagogastric junction demonstrated only a few scattered superficial mucosal erosions. There was no ulcer or infiltrating process seen. Pylorus patent and easily traversed. Examination of the bulb, second and third portion revealed entirely normal duodenal mucosa.  Biopsies of the abnormal gastric mucosa  taken for histologic study. Subsequently, biopsies of the abnormal distal esophagus taken.  THERAPEUTIC / DIAGNOSTIC MANEUVERS PERFORMED:  See above   COMPLICATIONS:  None  IMPRESSION:   Abnormal Distal esophagus-query short segment Barrett's esophagus.. Gastric erosions of doubtful clinical significance-status post biopsy  RECOMMENDATIONS:   Followup on pathology. Continue Dexilant 60 mg orally daily. We'll contemplate a HIDA with CCK challenge in the near future to wrap upper her GI evaluation depending on biopsy results  Today's findings would not really explain her symptoms. Her symptoms are atypical. It's good to know she has had a recent negative cardiac evaluation.  My findings and recommendations have been discussed with the patient's husband at the bedside today.    _______________________________ R. Roetta Sessions, MD FACP [facility] eSigned:  R. Roetta Sessions, MD FACP [facility] 06/26/2013 9:26 AM     CC:  PATIENT NAME:  Marisah, Laker MR#: [id number]

## 2013-06-26 NOTE — H&P (View-Only) (Signed)
Primary Care Physician:  Margaret Simpson, MD Primary Gastroenterologist:  Dr.   Pre-Procedure History & Physical: HPI:  Tracy Humphrey is a 62 y.o. female here for further evaluation of a week or so history of "fluttering" in her chest. Has taken 2 TUMS every day since age 43 at the direction of her gynecologist. Stop taking TUMS just couple days ago and did experience some vague symptoms which he describes as heartburn but prior to this period, she denies any sort of indigestion, heartburn reflux. Has not had any nausea, vomiting or abdominal pain. She had negative cardiac stress test years ago. She is scheduled to see the cardiologist in the future. Recent EKG was normal. She's gained 20 pounds in the past year. Bowel function is described as normal. No melena or hematochezia. No family history of GI malignancy. Normal screening colonoscopy in 2016; she's here for average risk screening 2016.  She denies a prior history of peptic ulcer disease. Chest or her upper GI tract evaluated. She tells me that Dr. Simpson has ordered an ultrasound. Also, H. pylori serologies were drawn through her office. Recently given a prescription for pantoprazole 20 mg daily Has not started that prescription as of yet.  Past Medical History  Diagnosis Date  . Obesity 2007     prior to this had no weight issue     Past Surgical History  Procedure Laterality Date  . Cesarean section  29 years ago   . Right foot surgery 2003. 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 Colonoscopy  02/13/2005    Dr. Lauris Keepers- normal rectum, normal colon    Prior to Admission medications   Medication Sig Start Date End Date Taking? Authorizing Provider  aspirin (ASPIRIN LOW DOSE) 81 MG EC tablet Take 81 mg by mouth daily.     Yes Historical Provider, MD  calcium carbonate (TUMS - DOSED IN MG ELEMENTAL CALCIUM) 500 MG chewable tablet Chew 2 tablets by mouth daily.     Yes Historical Provider, MD   Multiple Vitamin (MULTIVITAMIN PO) Take by mouth.     Yes Historical Provider, MD  Benzoyl Peroxide 2.5 % gel Apply once daily to affected area(s) , as needed 02/28/13 02/28/14  Margaret E Simpson, MD  pantoprazole (PROTONIX) 20 MG tablet Take 1 tablet (20 mg total) by mouth daily. 06/09/13 07/10/13  Margaret E Simpson, MD  temazepam (RESTORIL) 15 MG capsule Take 1 capsule (15 mg total) by mouth at bedtime as needed for sleep. 03/26/13   Margaret E Simpson, MD    Allergies as of 06/11/2013 - Review Complete 06/11/2013  Allergen Reaction Noted  . Prednisone  03/08/2011  . Penicillins  06/21/2009    Family History  Problem Relation Age of Onset  . Dementia Mother 81  . Hypertension Mother   . Hypertension Father   . Heart disease Father   . Diabetes Father   . Hypertension Sister   . Coronary artery disease Sister 49  . Hypertension Brother 59  . Alcohol abuse Brother     History   Social History  . Marital Status: Married    Spouse Name: N/A    Number of Children: N/A  . Years of Education: N/A   Occupational History  . [employer]     Social History Main Topics  . Smoking status: Never Smoker   . Smokeless tobacco: Not on file     Comment: Never smoke  . Alcohol Use: No  .   Drug Use: No  . Sexual Activity: Not on file   Other Topics Concern  . Not on file   Social History Narrative  . No narrative on file    Review of Systems: See HPI, otherwise negative ROS  Physical Exam: BP 126/70  Pulse 74  Temp(Src) 98.6 F (37 C) (Oral)  Ht 5' (1.524 m)  Wt 171 lb (77.565 kg)  BMI 33.40 kg/m2 General:   Alert,  Well-developed, well-nourished, pleasant and cooperative in NAD Skin:  Intact without significant lesions or rashes. Eyes:  Sclera clear, no icterus.   Conjunctiva pink. Ears:  Normal auditory acuity. Nose:  No deformity, discharge,  or lesions. Mouth:  No deformity or lesions. Neck:  Supple; no masses or thyromegaly. No significant cervical adenopathy. Lungs:   Clear throughout to auscultation.   No wheezes, crackles, or rhonchi. No acute distress. Heart:  Regular rate with occasional ectopic beat noted ; no murmurs, clicks, rubs,  or gallops. Abdomen: Non-distended, normal bowel sounds.  Soft and nontender without appreciable mass or hepatosplenomegaly.  Pulses:  Normal pulses noted. Extremities:  Without clubbing or edema.  Impression :  Very pleasant 62-year-old lady with recent history of intermittent "fluttering" in her chest. Has been on TUMS for years for the calcium component. Stopped  taking them recently and perceives some vague symptoms consistent with reflux. However, prior to just recently, she really hasn't had any upper GI tract symptoms. She has gained 20 pounds over the past one year which could certainly predispose one to reflux. She describes "fluttering" and was noted to have some ectopic beats on physical exam today. She has no alarm GI symptoms at this time. There is nothing in her history to suggest occult biliary or acid peptic disease otherwise.   Recommendations:  GERD information  Trial of Dexilant 60 mg daily - 2 week sample supply  Stool for occult blood testing  No need for urgent EGD at this time, however, patient may yet benefit from a diagnostic EGD;   Will review cardiology consult and ultrasound which is pending.  Telephone report in 2 weeks  Loose 20 pounds over the next 12 months  Further recommendations to follow.  

## 2013-06-26 NOTE — Interval H&P Note (Signed)
History and Physical Interval Note:  06/26/2013 8:03 AM  Tracy Humphrey  has presented today for surgery, with the diagnosis of GERD  The various methods of treatment have been discussed with the patient and family. After consideration of risks, benefits and other options for treatment, the patient has consented to  Procedure(s) with comments: ESOPHAGOGASTRODUODENOSCOPY (EGD) (N/A) - 8:00 as a surgical intervention .  The patient's history has been reviewed, patient examined, no change in status, stable for surgery.  I have reviewed the patient's chart and labs.  Questions were answered to the patient's satisfaction.     Tracy Humphrey  No change. Cardiac workup negative. Some epigastric "bubbling" persists with Dexilant. Finished polyp or treatment 4 days ago. EGD per plan.

## 2013-06-30 ENCOUNTER — Encounter: Payer: Self-pay | Admitting: Internal Medicine

## 2013-06-30 ENCOUNTER — Encounter: Payer: Self-pay | Admitting: Family Medicine

## 2013-06-30 ENCOUNTER — Other Ambulatory Visit: Payer: Self-pay | Admitting: Internal Medicine

## 2013-06-30 DIAGNOSIS — R109 Unspecified abdominal pain: Secondary | ICD-10-CM

## 2013-07-01 ENCOUNTER — Encounter: Payer: Self-pay | Admitting: Internal Medicine

## 2013-07-01 ENCOUNTER — Other Ambulatory Visit: Payer: Self-pay | Admitting: Internal Medicine

## 2013-07-01 ENCOUNTER — Encounter: Payer: Self-pay | Admitting: Gastroenterology

## 2013-07-01 ENCOUNTER — Encounter (HOSPITAL_COMMUNITY): Payer: Self-pay

## 2013-07-01 ENCOUNTER — Encounter (HOSPITAL_COMMUNITY)
Admission: RE | Admit: 2013-07-01 | Discharge: 2013-07-01 | Disposition: A | Discharge status: Home / Self Care | Payer: 59 | Source: Ambulatory Visit | Attending: Internal Medicine | Admitting: Internal Medicine

## 2013-07-01 DIAGNOSIS — R109 Unspecified abdominal pain: Secondary | ICD-10-CM | POA: Insufficient documentation

## 2013-07-01 DIAGNOSIS — Z8619 Personal history of other infectious and parasitic diseases: Secondary | ICD-10-CM

## 2013-07-01 MED ORDER — TECHNETIUM TC 99M MEBROFENIN IV KIT
5.0000 | PACK | Freq: Once | INTRAVENOUS | Status: AC | PRN
  Administered 2013-07-01: 5 via INTRAVENOUS

## 2013-07-01 MED ORDER — SINCALIDE 5 MCG IJ SOLR
INTRAMUSCULAR | Status: AC
  Filled 2013-07-01: qty 5

## 2013-07-01 MED ORDER — STERILE WATER FOR INJECTION IJ SOLN
INTRAMUSCULAR | Status: AC
  Filled 2013-07-01: qty 10

## 2013-07-10 DIAGNOSIS — I809 Phlebitis and thrombophlebitis of unspecified site: Secondary | ICD-10-CM

## 2013-07-10 HISTORY — PX: EYE SURGERY: SHX253

## 2013-07-10 HISTORY — DX: Phlebitis and thrombophlebitis of unspecified site: I80.9

## 2013-08-04 ENCOUNTER — Encounter (HOSPITAL_COMMUNITY): Payer: 59

## 2013-08-05 ENCOUNTER — Encounter (HOSPITAL_COMMUNITY)
Admission: RE | Admit: 2013-08-05 | Discharge: 2013-08-05 | Disposition: A | Discharge status: Home / Self Care | Payer: 59 | Source: Ambulatory Visit | Attending: Internal Medicine | Admitting: Internal Medicine

## 2013-08-05 ENCOUNTER — Encounter: Payer: Self-pay | Admitting: Internal Medicine

## 2013-08-05 ENCOUNTER — Encounter (HOSPITAL_COMMUNITY): Payer: Self-pay

## 2013-08-05 DIAGNOSIS — Z8619 Personal history of other infectious and parasitic diseases: Secondary | ICD-10-CM

## 2013-08-05 DIAGNOSIS — R143 Flatulence: Secondary | ICD-10-CM

## 2013-08-05 DIAGNOSIS — A048 Other specified bacterial intestinal infections: Secondary | ICD-10-CM | POA: Insufficient documentation

## 2013-08-05 DIAGNOSIS — R142 Eructation: Secondary | ICD-10-CM | POA: Insufficient documentation

## 2013-08-05 DIAGNOSIS — R141 Gas pain: Secondary | ICD-10-CM | POA: Insufficient documentation

## 2013-08-05 MED ORDER — CARBON-14 CAPSULE
1.0000 | ORAL_CAPSULE | Freq: Once | ORAL | Status: AC
  Administered 2013-08-05: 1 via ORAL
  Filled 2013-08-05: qty 1

## 2013-08-11 ENCOUNTER — Encounter: Payer: Self-pay | Admitting: Internal Medicine

## 2013-08-17 ENCOUNTER — Telehealth: Payer: Self-pay | Admitting: Internal Medicine

## 2013-08-17 NOTE — Telephone Encounter (Signed)
GREAT  NEWS!

## 2013-08-17 NOTE — Telephone Encounter (Signed)
Informed pt C14 breath test is negative.  Pt reports less "bubbling" but "fullness" after she eats; trying to eat more healthy, getting more exercise(probable fatty liver).  Currently off acid suppression secondary to breath testing.  Has Dexilant on hand.  I advised her to take 60mg  daily x 2 weeks and let me know how she is doing with it.  No further GI evaluation needed for now.

## 2013-08-22 ENCOUNTER — Other Ambulatory Visit: Payer: Self-pay

## 2013-08-22 ENCOUNTER — Encounter: Payer: Self-pay | Admitting: Internal Medicine

## 2013-08-22 MED ORDER — DEXLANSOPRAZOLE 60 MG PO CPDR: 60.0000 mg | DELAYED_RELEASE_CAPSULE | Freq: Every day | ORAL | Status: DC

## 2013-10-17 ENCOUNTER — Ambulatory Visit: Payer: 59 | Admitting: Family Medicine

## 2013-11-11 ENCOUNTER — Telehealth: Payer: Self-pay | Admitting: Family Medicine

## 2013-11-11 NOTE — Telephone Encounter (Signed)
Patient states that she will not try medicine (Flonase).  Her reaction to prednisone was "steroid psychosis"

## 2013-11-11 NOTE — Telephone Encounter (Signed)
Pt suffers from seasonal allergies and vertigo. Has been using oTC med of her choice , persistent symptoms for over 2 weeks Stated today "my sister with similar issues was told by her Doctor that flonase is the best thing to open up the eustacian tubes"  Pt was going to purchase OTC I will erx same for her  PLEASE call pt , let her know since she has prednisone listed as an allergy and the flonase is in the family it is flagging as a reason not to prescribe the flonase. Explain this is only medication applied intranasally and not ingested, less of the steroid s/e potential. Specifically document what her "allergy is , I think it will be fine to "try " there flonase, let her know, send me back a msg pls , I will send in based on her response

## 2013-11-11 NOTE — Telephone Encounter (Signed)
noted 

## 2013-11-13 ENCOUNTER — Encounter: Payer: Self-pay | Admitting: Family Medicine

## 2013-11-13 ENCOUNTER — Ambulatory Visit (INDEPENDENT_AMBULATORY_CARE_PROVIDER_SITE_OTHER): Payer: 59 | Admitting: Family Medicine

## 2013-11-13 VITALS — BP 120/82 | HR 80 | Resp 18 | Ht 60.0 in | Wt 176.1 lb

## 2013-11-13 DIAGNOSIS — E669 Obesity, unspecified: Secondary | ICD-10-CM

## 2013-11-13 DIAGNOSIS — J309 Allergic rhinitis, unspecified: Secondary | ICD-10-CM

## 2013-11-13 DIAGNOSIS — G4700 Insomnia, unspecified: Secondary | ICD-10-CM

## 2013-11-13 DIAGNOSIS — R7303 Prediabetes: Secondary | ICD-10-CM

## 2013-11-13 DIAGNOSIS — E785 Hyperlipidemia, unspecified: Secondary | ICD-10-CM

## 2013-11-13 DIAGNOSIS — R7309 Other abnormal glucose: Secondary | ICD-10-CM

## 2013-11-13 DIAGNOSIS — J302 Other seasonal allergic rhinitis: Secondary | ICD-10-CM | POA: Insufficient documentation

## 2013-11-13 DIAGNOSIS — R42 Dizziness and giddiness: Secondary | ICD-10-CM

## 2013-11-13 MED ORDER — AZITHROMYCIN 250 MG PO TABS: ORAL_TABLET | ORAL | Status: DC

## 2013-11-13 MED ORDER — PSEUDOEPHEDRINE HCL 30 MG PO TABS: ORAL_TABLET | ORAL | Status: DC

## 2013-11-13 MED ORDER — LORATADINE 10 MG PO TABS: 10.0000 mg | ORAL_TABLET | Freq: Every day | ORAL | Status: DC

## 2013-11-13 NOTE — Patient Instructions (Addendum)
F/u in 5.5 month  Please phone in  90 day supply of loratidine and sudafed  Commit to daily loratidine and OTC  Allergy med  Use one sudafed once daily for 3 to 5 days     Zpack is prescribed.  I believe that your symptoms are primarily allergy driven  Fasting lipid  And HBa1C

## 2013-11-16 NOTE — Assessment & Plan Note (Signed)
Uncontrolled, needs to commit to daily use of allrgy med and short 5 day course of daily sudafed will also likely improve her symptoms. Nasal saline flushes daily indicated

## 2013-11-16 NOTE — Assessment & Plan Note (Signed)
6 week symptoms of vertigo, z pack prescribed as may have now mild soinus infection, will see if this will help to revert symptoms of vertigo. If persists ENT eval recommended

## 2013-11-16 NOTE — Assessment & Plan Note (Signed)
Deteriorated. Patient re-educated about  the importance of commitment to a  minimum of 150 minutes of exercise per week. The importance of healthy food choices with portion control discussed. Encouraged to start a food diary, count calories and to consider  joining a support group. Sample diet sheets offered. Goals set by the patient for the next several months.    

## 2013-11-16 NOTE — Progress Notes (Signed)
Subjective:    Patient ID: Tracy ConesJoan S Jamroz, female    DOB: 1951/05/20, 63 y.o.   MRN: [id number]  HPI C/o intermittent vertigo for the past  Weeks. Main trigger appears  to be season change as well as fluctuations in the weather.  Past h/o disabling vertigo for months. Pt was taught home exercises to address her symptoms in the past, she has been doing these with little improvement and it has been inconsistent. In today stating that she has now developed cough with post nasal drainage which is at times yellow, has done "some research' and feels as though antibiotics may be indicated. Denies fever, chills or sore throat. Has been unable to exercise as she had wished to  And is concerned about weight gain, needs labs updated  Requests her pneumonia vaccine but I am deciding too hold off based on complaints voiced today   Review of Systems See HPI Denies recent fever or chills. Denies sinus pressure, has noted  nasal congestion with increased post nasal drainage,denies  ear pain or sore throat. Denies chest congestion, productive cough or wheezing. Denies chest pains, palpitations and leg swelling Denies abdominal pain, nausea, vomiting,diarrhea or constipation.   Denies dysuria, frequency, hesitancy or incontinence. Denies joint pain, swelling and limitation in mobility. Denies headaches, seizures, numbness, or tingling. Denies depression, anxiety or insomnia. Denies skin break down or rash.        Objective:   Physical Exam BP 120/82  Pulse 80  Resp 18  Ht 5' (1.524 m)  Wt 176 lb 1.3 oz (79.869 kg)  BMI 34.39 kg/m2  SpO2 98% Patient alert and oriented and in no cardiopulmonary distress.  HEENT: No facial asymmetry, EOMI,mild maxillary  sinus tenderness,  oropharynx pink and moist.  Neck supple no adenopathy.Nasal mucosa eryhtematous , tM clear  Chest: Clear to auscultation bilaterally.  CVS: S1, S2 no murmurs, no S3.  ABD: Soft non tender. Bowel sounds normal.  Ext: No  edema  MS: Adequate ROM spine, shoulders, hips and knees.  Skin: Intact, no ulcerations or rash noted.  Psych: Good eye contact, normal affect. Memory intact not anxious or depressed appearing.  CNS: CN 2-12 intact, power, tone and sensation normal throughout.        Assessment & Plan:  Allergic sinusitis Uncontrolled, needs to commit to daily use of allrgy med and short 5 day course of daily sudafed will also likely improve her symptoms. Nasal saline flushes daily indicated  VERTIGO 6 week symptoms of vertigo, z pack prescribed as may have now mild soinus infection, will see if this will help to revert symptoms of vertigo. If persists ENT eval recommended  Prediabetes Please advise the patient that their blood sugars are higher than normal.   A normal HbA1C is less than 5.7, please give them their value.  They need to cut back on carb's and sweets, start regular exercise, target at least 30 minutes 5 days a weekly and aim for weight loss.  This will reduce the risk of becoming diabetic or delayed development. Updated lab needed at/ before next visit.   Insomnia Improved, on no medic sation for this , stress and anxiety also improved  OBESITY, UNSPECIFIED Deteriorated. Patient re-educated about  the importance of commitment to a  minimum of 150 minutes of exercise per week. The importance of healthy food choices with portion control discussed. Encouraged to start a food diary, count calories and to consider  joining a support group. Sample diet sheets offered. Goals set  by the patient for the next several months.

## 2013-11-16 NOTE — Assessment & Plan Note (Signed)
Please advise the patient that their blood sugars are higher than normal.   A normal HbA1C is less than 5.7, please give them their value.  They need to cut back on carb's and sweets, start regular exercise, target at least 30 minutes 5 days a weekly and aim for weight loss.  This will reduce the risk of becoming diabetic or delayed development. Updated lab needed at/ before next visit.

## 2013-11-16 NOTE — Assessment & Plan Note (Signed)
Improved, on no medic sation for this , stress and anxiety also improved

## 2013-12-07 LAB — LIPID PANEL
Cholesterol: 198 mg/dL (ref 0–200)
HDL: 55 mg/dL (ref >39)
LDL CALC: 125 mg/dL — AB (ref 0–99)
Total CHOL/HDL Ratio: 3.6 Ratio
Triglycerides: 88 mg/dL (ref <150)
VLDL: 18 mg/dL (ref 0–40)

## 2013-12-07 LAB — HEMOGLOBIN A1C
Hgb A1c MFr Bld: 6.2 % — ABNORMAL HIGH (ref <5.7)
MEAN PLASMA GLUCOSE: 131 mg/dL — AB (ref <117)

## 2013-12-10 ENCOUNTER — Encounter: Payer: Self-pay | Admitting: Family Medicine

## 2014-02-05 ENCOUNTER — Other Ambulatory Visit: Payer: Self-pay

## 2014-02-05 DIAGNOSIS — J309 Allergic rhinitis, unspecified: Secondary | ICD-10-CM

## 2014-02-05 MED ORDER — LORATADINE 10 MG PO TABS: 10.0000 mg | ORAL_TABLET | Freq: Every day | ORAL | Status: DC

## 2014-02-05 MED ORDER — PSEUDOEPHEDRINE HCL 30 MG PO TABS: ORAL_TABLET | ORAL | Status: DC

## 2014-03-05 ENCOUNTER — Ambulatory Visit (INDEPENDENT_AMBULATORY_CARE_PROVIDER_SITE_OTHER): Payer: 59 | Admitting: Family Medicine

## 2014-03-05 ENCOUNTER — Other Ambulatory Visit: Payer: Self-pay

## 2014-03-05 ENCOUNTER — Encounter: Payer: Self-pay | Admitting: Family Medicine

## 2014-03-05 ENCOUNTER — Ambulatory Visit (HOSPITAL_COMMUNITY)
Admission: RE | Admit: 2014-03-05 | Discharge: 2014-03-05 | Disposition: A | Discharge status: Home / Self Care | Payer: 59 | Source: Ambulatory Visit | Attending: Family Medicine | Admitting: Family Medicine

## 2014-03-05 VITALS — BP 132/80 | HR 82 | Resp 16 | Ht 60.0 in | Wt 178.0 lb

## 2014-03-05 DIAGNOSIS — I82812 Embolism and thrombosis of superficial veins of left lower extremities: Secondary | ICD-10-CM

## 2014-03-05 DIAGNOSIS — M79609 Pain in unspecified limb: Secondary | ICD-10-CM | POA: Insufficient documentation

## 2014-03-05 DIAGNOSIS — M549 Dorsalgia, unspecified: Secondary | ICD-10-CM | POA: Insufficient documentation

## 2014-03-05 DIAGNOSIS — M545 Low back pain, unspecified: Secondary | ICD-10-CM

## 2014-03-05 DIAGNOSIS — R7309 Other abnormal glucose: Secondary | ICD-10-CM

## 2014-03-05 DIAGNOSIS — R7303 Prediabetes: Secondary | ICD-10-CM

## 2014-03-05 DIAGNOSIS — E785 Hyperlipidemia, unspecified: Secondary | ICD-10-CM

## 2014-03-05 DIAGNOSIS — R5381 Other malaise: Secondary | ICD-10-CM

## 2014-03-05 DIAGNOSIS — I743 Embolism and thrombosis of arteries of the lower extremities: Secondary | ICD-10-CM | POA: Insufficient documentation

## 2014-03-05 DIAGNOSIS — M79605 Pain in left leg: Secondary | ICD-10-CM | POA: Insufficient documentation

## 2014-03-05 DIAGNOSIS — R5383 Other fatigue: Secondary | ICD-10-CM

## 2014-03-05 DIAGNOSIS — I82819 Embolism and thrombosis of superficial veins of unspecified lower extremities: Secondary | ICD-10-CM

## 2014-03-05 MED ORDER — RIVAROXABAN 15 MG PO TABS: 15.0000 mg | ORAL_TABLET | Freq: Two times a day (BID) | ORAL | Status: DC

## 2014-03-05 NOTE — Assessment & Plan Note (Addendum)
Intermittent, presenting c/o more consistent with nerve irritation, will obtain xray and take things from there. Left lateral numbness suggestive of lateral cutaneous nerve irritation

## 2014-03-05 NOTE — Patient Instructions (Addendum)
F/u in early October , call iif you need mme before  Fasting lipid, cmp , HBA1C TSH and CBC for October visit   You are referred for [place] of left leg to r/o clot today   You need an xray of your back today   I believe that the burning pain is from  Nerve irritation, may be just one nerve,  Or several . I believe you will need an MRI of your low back and will discuss with Dr Romeo Apple before ordering this  Remember to work on some "down time"  Gabapentinn is the medication used for nerve pain and  We I will prescribe some for you to have if needed (if you want this)

## 2014-03-05 NOTE — Assessment & Plan Note (Signed)
Acute pain 1 day ago with abnormal study. Discussed with hematology, recommended xaralto 150 mg twice daily.Pt may take aleve for pain with this if needed. She is made aware of increased risk of bleeding or bruising with the drug I am also referring her eval with hematologist and definitive f/u of this. Discussion with vascular , Dr Cecelia Byars, who I had tried to spk with initially concurred with above, however , also stated that his approach is generally to treat the pain symptom, re image , and see what Follow up and management of this will be with hematology, she is referred

## 2014-03-05 NOTE — Assessment & Plan Note (Signed)
Venous doppler to r/o dVT

## 2014-03-05 NOTE — Progress Notes (Signed)
   Subjective:    Patient ID: Tracy Humphrey, female    DOB: January 23, 1951, 63 y.o.   MRN: [id number]  HPI  Intermittent left thigh numbness and tingling for past4 weeks. Has had symptoms briefly for years intermittently. Last pm for approx 2 hrs unbearable pain, never any weakness, never incontinenece of stool or urine Reports decreased sensation on  Outer aspect of left thigh Denies shortness of breath or cough Denies buttock or groin symptoms H/o back pain which kept her lying on the floor for approx 6 weeks, though she still worked , due to strain with lifting excessively. Denies chronic back pain   Review of Systems See HPI Denies recent fever or chills. Denies sinus pressure, nasal congestion, ear pain or sore throat. Denies chest congestion, productive cough or wheezing. Denies chest pains, palpitations and leg swelling  Denies joint pain, swelling and limitation in mobility. Denies skin break down or rash.        Objective:   Physical Exam BP 132/80  Pulse 82  Resp 16  Ht 5' (1.524 m)  Wt 178 lb (80.74 kg)  BMI 34.76 kg/m2  SpO2 98% Patient alert and oriented and in no cardiopulmonary distress.  HEENT: No facial asymmetry, EOMI,   oropharynx pink and moist.  Neck supple no JVD, no mass.  Chest: Clear to auscultation bilaterally.  CVS: S1, S2 no murmurs, no S3.Regular rate.  ABD: Soft non tender.   Ext: No edema  MS: Adequate ROM spine, shoulders, hips and knees.  Skin: Intact, no ulcerations or rash noted.  Psych: Good eye contact, normal affect. Memory intact not anxious or depressed appearing.  CNS: CN 2-12 intact, power,  Normal, decreased sensation on left upper thigh , lateral aspect         Assessment & Plan:

## 2014-03-05 NOTE — Assessment & Plan Note (Signed)
rept lab in 2 month at f/u

## 2014-03-05 NOTE — Assessment & Plan Note (Signed)
Updated lab needed at/ before next visit.   

## 2014-03-10 ENCOUNTER — Other Ambulatory Visit (HOSPITAL_COMMUNITY): Payer: Self-pay | Admitting: Hematology and Oncology

## 2014-03-10 ENCOUNTER — Encounter (HOSPITAL_COMMUNITY): Payer: 59 | Attending: Hematology and Oncology

## 2014-03-10 DIAGNOSIS — I82812 Embolism and thrombosis of superficial veins of left lower extremities: Secondary | ICD-10-CM

## 2014-03-10 DIAGNOSIS — Z6830 Body mass index (BMI) 30.0-30.9, adult: Secondary | ICD-10-CM | POA: Insufficient documentation

## 2014-03-10 DIAGNOSIS — E669 Obesity, unspecified: Secondary | ICD-10-CM | POA: Diagnosis not present

## 2014-03-10 DIAGNOSIS — I82819 Embolism and thrombosis of superficial veins of unspecified lower extremities: Secondary | ICD-10-CM

## 2014-03-10 DIAGNOSIS — I8000 Phlebitis and thrombophlebitis of superficial vessels of unspecified lower extremity: Secondary | ICD-10-CM | POA: Diagnosis present

## 2014-03-10 LAB — CBC WITH DIFFERENTIAL/PLATELET
BASOS ABS: 0 10*3/uL (ref 0.0–0.1)
Basophils Relative: 0 % (ref 0–1)
Eosinophils Absolute: 0.2 10*3/uL (ref 0.0–0.7)
Eosinophils Relative: 2 % (ref 0–5)
HCT: 42.2 % (ref 36.0–46.0)
HEMOGLOBIN: 14.6 g/dL (ref 12.0–15.0)
Lymphocytes Relative: 26 % (ref 12–46)
Lymphs Abs: 2.5 10*3/uL (ref 0.7–4.0)
MCH: 30.4 pg (ref 26.0–34.0)
MCHC: 34.6 g/dL (ref 30.0–36.0)
MCV: 87.7 fL (ref 78.0–100.0)
Monocytes Absolute: 0.6 10*3/uL (ref 0.1–1.0)
Monocytes Relative: 6 % (ref 3–12)
NEUTROS ABS: 6.4 10*3/uL (ref 1.7–7.7)
NEUTROS PCT: 66 % (ref 43–77)
Platelets: 161 10*3/uL (ref 150–400)
RBC: 4.81 MIL/uL (ref 3.87–5.11)
RDW: 13 % (ref 11.5–15.5)
WBC: 9.7 10*3/uL (ref 4.0–10.5)

## 2014-03-10 LAB — COMPREHENSIVE METABOLIC PANEL
ALBUMIN: 3.7 g/dL (ref 3.5–5.2)
ALK PHOS: 92 U/L (ref 39–117)
ALT: 21 U/L (ref 0–35)
AST: 22 U/L (ref 0–37)
Anion gap: 11 (ref 5–15)
BILIRUBIN TOTAL: 0.6 mg/dL (ref 0.3–1.2)
BUN: 19 mg/dL (ref 6–23)
CHLORIDE: 102 meq/L (ref 96–112)
CO2: 27 mEq/L (ref 19–32)
Calcium: 9.4 mg/dL (ref 8.4–10.5)
Creatinine, Ser: 0.65 mg/dL (ref 0.50–1.10)
GFR calc Af Amer: >90 mL/min (ref >90)
GFR calc non Af Amer: >90 mL/min (ref >90)
Glucose, Bld: 133 mg/dL — ABNORMAL HIGH (ref 70–99)
POTASSIUM: 4.3 meq/L (ref 3.7–5.3)
Sodium: 140 mEq/L (ref 137–147)
Total Protein: 7.5 g/dL (ref 6.0–8.3)

## 2014-03-10 LAB — HOMOCYSTEINE: Homocysteine: 7.2 umol/L (ref 4.0–15.4)

## 2014-03-10 LAB — ANTITHROMBIN III: AntiThromb III Func: 100 % (ref 75–120)

## 2014-03-10 NOTE — Progress Notes (Signed)
LABS DRAWN FOR HYPER COAG PANEL

## 2014-03-11 ENCOUNTER — Encounter (HOSPITAL_BASED_OUTPATIENT_CLINIC_OR_DEPARTMENT_OTHER): Payer: 59

## 2014-03-11 ENCOUNTER — Encounter (HOSPITAL_COMMUNITY): Payer: Self-pay

## 2014-03-11 ENCOUNTER — Ambulatory Visit (HOSPITAL_COMMUNITY): Payer: 59

## 2014-03-11 VITALS — BP 141/63 | HR 101 | Temp 98.8°F | Resp 18 | Ht 60.0 in | Wt 178.0 lb

## 2014-03-11 DIAGNOSIS — I8002 Phlebitis and thrombophlebitis of superficial vessels of left lower extremity: Secondary | ICD-10-CM

## 2014-03-11 DIAGNOSIS — I8000 Phlebitis and thrombophlebitis of superficial vessels of unspecified lower extremity: Secondary | ICD-10-CM

## 2014-03-11 LAB — LUPUS ANTICOAGULANT PANEL
DRVVT INCUBATED 1 1 MIX: 47 s — AB (ref <42.9)
DRVVT: 71.8 secs — ABNORMAL HIGH (ref <42.9)
Drvvt confirmation: 1.17 Ratio — ABNORMAL HIGH (ref <1.15)
Lupus Anticoagulant: DETECTED — AB
PTT LA: 65.1 s — AB (ref 28.0–43.0)
PTTLA 4:1 Mix: 57.7 secs — ABNORMAL HIGH (ref 28.0–43.0)
PTTLA Confirmation: 8 secs — ABNORMAL HIGH (ref <8.0)

## 2014-03-11 LAB — CARDIOLIPIN ANTIBODIES, IGG, IGM, IGA
Anticardiolipin IgA: 7 APL U/mL — ABNORMAL LOW (ref <22)
Anticardiolipin IgG: 3 GPL U/mL — ABNORMAL LOW (ref <23)
Anticardiolipin IgM: 4 MPL U/mL — ABNORMAL LOW (ref <11)

## 2014-03-11 LAB — BETA-2-GLYCOPROTEIN I ABS, IGG/M/A
Beta-2 Glyco I IgG: 2 G Units (ref <20)
Beta-2-Glycoprotein I IgA: 7 A Units (ref <20)
Beta-2-Glycoprotein I IgM: 3 M Units (ref <20)

## 2014-03-11 LAB — PROTEIN C ACTIVITY: Protein C Activity: 200 % — ABNORMAL HIGH (ref 75–133)

## 2014-03-11 LAB — PROTEIN S ACTIVITY: PROTEIN S ACTIVITY: 168 % — AB (ref 69–129)

## 2014-03-11 NOTE — Patient Instructions (Signed)
[facility] Discharge Instructions  RECOMMENDATIONS MADE BY THE CONSULTANT AND ANY TEST RESULTS WILL BE SENT TO YOUR REFERRING PHYSICIAN.  Dr. Zigmund Daniel is ordering an ultrasound of your left leg to be done on September 17th.  He will see you back in the office that afternoon to discuss the results.   Thank you for choosing [facility] to provide your oncology and hematology care.  To afford each patient quality time with our providers, please arrive at least 15 minutes before your scheduled appointment time.  With your help, our goal is to use those 15 minutes to complete the necessary work-up to ensure our physicians have the information they need to help with your evaluation and healthcare recommendations.    Effective January 1st, 2014, we ask that you re-schedule your appointment with our physicians should you arrive 10 or more minutes late for your appointment.  We strive to give you quality time with our providers, and arriving late affects you and other patients whose appointments are after yours.    Again, thank you for choosing [facility].  Our hope is that these requests will decrease the amount of time that you wait before being seen by our physicians.       _____________________________________________________________  Should you have questions after your visit to [facility], please contact our office at [phone] between the hours of 8:30 a.m. and 4:30 p.m.  Voicemails left after 4:30 p.m. will not be returned until the following business day.  For prescription refill requests, have your pharmacy contact our office with your prescription refill request.    _______________________________________________________________  We hope that we have given you very good care.  You may receive a patient satisfaction survey in the mail, please complete it and return it as soon as possible.  We value your  feedback!  _______________________________________________________________  Have you asked about our STAR program?  STAR stands for Survivorship Training and Rehabilitation, and this is a nationally recognized cancer care program that focuses on survivorship and rehabilitation.  Cancer and cancer treatments may cause problems, such as, pain, making you feel tired and keeping you from doing the things that you need or want to do. Cancer rehabilitation can help. Our goal is to reduce these troubling effects and help you have the best quality of life possible.  You may receive a survey from a nurse that asks questions about your current state of health.  Based on the survey results, all eligible patients will be referred to the [facility] program for an evaluation so we can better serve you!  A frequently asked questions sheet is available upon request.

## 2014-03-11 NOTE — Progress Notes (Signed)
Pavillion A. Barnet Glasgow, M.D.  NEW PATIENT EVALUATION   Name: Tracy Humphrey Date: 03/11/2014 MRN: [id number] DOB: 08-07-1950  PCP: Tula Nakayama, MD   REFERRING PHYSICIAN: Fayrene Helper, MD  REASON FOR REFERRAL: Superficial phlebitis saphenous vein 03/05/2014     HISTORY OF PRESENT ILLNESS:Tracy Humphrey is a 63 y.o. female who is referred by her family physician because of superficial phlebitis involving the left saphenous vein. 40 years ago she had surgery performed at [facility] and received an injection in the left thigh with resultant nerve injury and periodic pain that extremity since. The pain became much more excruciating 1 night on 03/04/2014 and she was evaluated by her family physician. Left lower 70.the study showed phlebitic changes in the saphenous vein and the patient was started on Xarelto 50 mg twice a day. She is only requiring any analgesics. She denies any swelling, chest pain, PND, orthopnea, palpitations, lower 70 swelling or redness, or previous episode of phlebitis. Her son was involved in an accident requiring neck surgery and her mom died about 2 weeks ago. There is no family history of early blood clotting. Patient is gravida 1 para 1 AB 0.   PAST MEDICAL HISTORY:  has a past medical history of Obesity (2007 ) and GERD (gastroesophageal reflux disease).     PAST SURGICAL HISTORY: Past Surgical History  Procedure Laterality Date  . Cesarean section  29 years ago   . Right foot surgery 2003. 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 Colonoscopy  02/13/2005    Dr. Gala Romney- normal rectum, normal colon  . Esophagogastroduodenoscopy N/A 06/26/2013    Procedure: ESOPHAGOGASTRODUODENOSCOPY (EGD);  Surgeon: Daneil Dolin, MD;  Location: [facility];  Service: Endoscopy;  Laterality: N/A;  8:00     CURRENT MEDICATIONS: has a current medication list which includes the  following prescription(s): aspirin, calcium carbonate, loratadine, multiple vitamins-minerals, pseudoephedrine, and rivaroxaban.   ALLERGIES: Prednisone and Penicillins   SOCIAL HISTORY:  reports that she has never smoked. She does not have any smokeless tobacco history on file. She reports that she does not drink alcohol or use illicit drugs.   FAMILY HISTORY: family history includes Alcohol abuse in her brother; Coronary artery disease (age of onset: 61) in her sister; Dementia (age of onset: 46) in her mother; Diabetes in her father; Heart disease in her father; Hypertension in her father, mother, and sister; Hypertension (age of onset: 32) in her brother.    REVIEW OF SYSTEMS:  Other than that discussed above is noncontributory.    PHYSICAL EXAM:  height is 5' (1.524 m) and weight is 178 lb (80.74 kg). Her oral temperature is 98.8 F (37.1 C). Her blood pressure is 141/63 and her pulse is 101. Her respiration is 18 and oxygen saturation is 98%.    GENERAL:alert, no distress and comfortable SKIN: skin color, texture, turgor are normal, no rashes or significant lesions EYES: normal, Conjunctiva are pink and non-injected, sclera clear OROPHARYNX:no exudate, no erythema and lips, buccal mucosa, and tongue normal  NECK: supple, thyroid normal size, non-tender, without nodularity CHEST: Normal AP diameter with no breast masses. LYMPH:  no palpable lymphadenopathy in the cervical, axillary or inguinal LUNGS: clear to auscultation and percussion with normal breathing effort HEART: regular rate & rhythm and no murmurs ABDOMEN:abdomen soft, non-tender and normal bowel sounds MUSCULOSKELETALl:no cyanosis of digits, no clubbing or  edema.. No calf or thigh tenderness. No edema. Homans sign negative. NEURO: alert & oriented x 3 with fluent speech, no focal motor/sensory deficits    LABORATORY DATA:  Lab on 03/10/2014  Component Date Value Ref Range Status  . WBC 03/10/2014 9.7  4.0 - 10.5  K/uL Final  . RBC 03/10/2014 4.81  3.87 - 5.11 MIL/uL Final  . Hemoglobin 03/10/2014 14.6  12.0 - 15.0 g/dL Final  . HCT 03/10/2014 42.2  36.0 - 46.0 % Final  . MCV 03/10/2014 87.7  78.0 - 100.0 fL Final  . MCH 03/10/2014 30.4  26.0 - 34.0 pg Final  . MCHC 03/10/2014 34.6  30.0 - 36.0 g/dL Final  . RDW 03/10/2014 13.0  11.5 - 15.5 % Final  . Platelets 03/10/2014 161  150 - 400 K/uL Final  . Neutrophils Relative % 03/10/2014 66  43 - 77 % Final  . Neutro Abs 03/10/2014 6.4  1.7 - 7.7 K/uL Final  . Lymphocytes Relative 03/10/2014 26  12 - 46 % Final  . Lymphs Abs 03/10/2014 2.5  0.7 - 4.0 K/uL Final  . Monocytes Relative 03/10/2014 6  3 - 12 % Final  . Monocytes Absolute 03/10/2014 0.6  0.1 - 1.0 K/uL Final  . Eosinophils Relative 03/10/2014 2  0 - 5 % Final  . Eosinophils Absolute 03/10/2014 0.2  0.0 - 0.7 K/uL Final  . Basophils Relative 03/10/2014 0  0 - 1 % Final  . Basophils Absolute 03/10/2014 0.0  0.0 - 0.1 K/uL Final  . Sodium 03/10/2014 140  137 - 147 mEq/L Final  . Potassium 03/10/2014 4.3  3.7 - 5.3 mEq/L Final  . Chloride 03/10/2014 102  96 - 112 mEq/L Final  . CO2 03/10/2014 27  19 - 32 mEq/L Final  . Glucose, Bld 03/10/2014 133* 70 - 99 mg/dL Final  . BUN 03/10/2014 19  6 - 23 mg/dL Final  . Creatinine, Ser 03/10/2014 0.65  0.50 - 1.10 mg/dL Final  . Calcium 03/10/2014 9.4  8.4 - 10.5 mg/dL Final  . Total Protein 03/10/2014 7.5  6.0 - 8.3 g/dL Final  . Albumin 03/10/2014 3.7  3.5 - 5.2 g/dL Final  . AST 03/10/2014 22  0 - 37 U/L Final  . ALT 03/10/2014 21  0 - 35 U/L Final  . Alkaline Phosphatase 03/10/2014 92  39 - 117 U/L Final  . Total Bilirubin 03/10/2014 0.6  0.3 - 1.2 mg/dL Final  . GFR calc non Af Amer 03/10/2014 >90  >90 mL/min Final  . GFR calc Af Amer 03/10/2014 >90  >90 mL/min Final   Comment: (NOTE)                          The eGFR has been calculated using the CKD EPI equation.                          This calculation has not been validated in all  clinical situations.                          eGFR's persistently <90 mL/min signify possible Chronic Kidney                          Disease.  . Anion gap 03/10/2014 11  5 - 15 Final  . AntiThromb III Func 03/10/2014 100  75 - 120 %  Final   Performed at [facility]  . Protein C Activity 03/10/2014 200* 75 - 133 % Final   Performed at [facility]  . Protein S Activity 03/10/2014 168* 69 - 129 % Final   Performed at [facility]  . PTT Lupus Anticoagulant 03/10/2014 65.1* 28.0 - 43.0 secs Final  . PTTLA Confirmation 03/10/2014 8.0* <8.0 secs Final  . PTTLA 4:1 Mix 03/10/2014 57.7* 28.0 - 43.0 secs Final  . Drvvt 03/10/2014 71.8* <42.9 secs Final  . Drvvt confirmation 03/10/2014 1.17* <1.15 Ratio Final  . dRVVT Incubated 1:1 Mix 03/10/2014 47.0* <42.9 secs Final  . Lupus Anticoagulant 03/10/2014 DETECTED* NOT DETECTED Final   Performed at [facility]  . Beta-2 Glyco I IgG 03/10/2014 2  <20 G Units Final  . Beta-2-Glycoprotein I IgM 03/10/2014 3  <20 M Units Final  . Beta-2-Glycoprotein I IgA 03/10/2014 7  <20 A Units Final   Performed at [facility]  . Homocysteine 03/10/2014 7.2  4.0 - 15.4 umol/L Final   Performed at [facility]  . Anticardiolipin IgG 03/10/2014 3* <23 GPL U/mL Final  . Anticardiolipin IgM 03/10/2014 4* <11 MPL U/mL Final  . Anticardiolipin IgA 03/10/2014 7* <22 APL U/mL Final   Comment: (NOTE)                          Reference Range:  Cardiolipin IgG                            Normal                  <23                            Low Positive (+)        23-35                            Moderate Positive (+)   36-50                            High Positive (+)       >50                          Reference Range:  Cardiolipin IgM                            Normal                  <11                            Low Positive (+)        11-20                            Moderate Positive (+)   21-30                             High Positive (+)       >30  Reference Range:  Cardiolipin IgA                            Normal                  <22                            Low Positive (+)        22-35                            Moderate Positive (+)   36-45                            High Positive (+)       >45                          Performed at [facility]    Urinalysis No results found for this basename: colorurine, appearanceur, labspec, phurine, glucoseu, hgbur, bilirubinur, ketonesur, proteinur, urobilinogen, nitrite, leukocytesur      @RADIOGRAPHY : Dg Lumbar Spine Complete  03/05/2014   CLINICAL DATA:  Low back pain.  No known injury.  EXAM: LUMBAR SPINE - COMPLETE 4+ VIEW  COMPARISON:  Lumbar MRI, 08/31/2003.  FINDINGS: No fracture. No spondylolisthesis. Disc spaces are relatively well preserved. Small endplate osteophytes are noted from L1 through L4. Mild facet degenerative change noted at L5-S1. Soft tissues are unremarkable.  IMPRESSION: No fracture or acute finding.  Minor degenerative change.   Electronically Signed   By: Lajean Manes M.D.   On: 03/05/2014 10:59   US Venous Img Lower Unilateral Left  03/05/2014   CLINICAL DATA:  Left leg pain  EXAM: Left LOWER EXTREMITY VENOUS DOPPLER ULTRASOUND  TECHNIQUE: Gray-scale sonography with graded compression, as well as color Doppler and duplex ultrasound were performed to evaluate the lower extremity deep venous systems from the level of the common femoral vein and including the common femoral, femoral, profunda femoral, popliteal and calf veins including the posterior tibial, peroneal and gastrocnemius veins when visible. The superficial great saphenous vein was also interrogated. Spectral Doppler was utilized to evaluate flow at rest and with distal augmentation maneuvers in the common femoral, femoral and popliteal veins.  COMPARISON:  None.  FINDINGS: Common Femoral Vein: No evidence of thrombus. Normal  compressibility, respiratory phasicity and response to augmentation.  Saphenofemoral Junction: No evidence of thrombus. Normal compressibility and flow on color Doppler imaging.  Profunda Femoral Vein: No evidence of thrombus. Normal compressibility and flow on color Doppler imaging.  Femoral Vein: No evidence of thrombus. Normal compressibility, respiratory phasicity and response to augmentation.  Popliteal Vein: No evidence of thrombus. Normal compressibility, respiratory phasicity and response to augmentation.  Calf Veins: No evidence of thrombus. Normal compressibility and flow on color Doppler imaging.  Superficial Great Saphenous Vein: Occlusive thrombus is noted in the mid thigh within the greater saphenous vein.  Venous Reflux:  None.  Other Findings:  None.  IMPRESSION: No evidence of deep venous thrombosis.  Superficial venous thrombosis is noted in the midportion of the greater saphenous vein. The saphenous femoral junction is patent.   Electronically Signed   By: Inez Catalina M.D.   On: 03/05/2014 11:59    PATHOLOGY: None.   IMPRESSION:  #1. Thrombophlebitis  left saphenous vein by ultrasound, physical exam normal today.  #2. 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 Continue Xarelto 50 mg twice a day. #2. Hypercoagulable workup is pending. #3. Followup on 03/26/2014 with ultrasound of the left lower extremity that same day. If negative, Xarelto will be discontinued. If evidence of phlebitis is still found, will switch to 20 mg of Xarelto daily and reimage in 3 months.   I appreciate the opportunity of sharing in her care.   Doroteo Bradford, MD 03/11/2014 2:36 PM   DISCLAIMER:  This note was dictated with voice recognition softwre.  Similar sounding words can inadvertently be transcribed inaccurately and may not be corrected upon review.

## 2014-03-12 ENCOUNTER — Ambulatory Visit (INDEPENDENT_AMBULATORY_CARE_PROVIDER_SITE_OTHER): Payer: 59

## 2014-03-12 VITALS — Wt 178.0 lb

## 2014-03-12 DIAGNOSIS — Z23 Encounter for immunization: Secondary | ICD-10-CM

## 2014-03-12 LAB — PROTEIN S, TOTAL: Protein S Ag, Total: 99 % (ref 60–150)

## 2014-03-12 LAB — PROTEIN C, TOTAL: PROTEIN C, TOTAL: 98 % (ref 72–160)

## 2014-03-12 NOTE — Progress Notes (Signed)
Received injection with no complications  

## 2014-03-14 LAB — PROTHROMBIN GENE MUTATION

## 2014-03-14 LAB — FACTOR 5 LEIDEN

## 2014-03-17 ENCOUNTER — Other Ambulatory Visit (HOSPITAL_COMMUNITY): Payer: Self-pay | Admitting: Gynecology

## 2014-03-17 DIAGNOSIS — Z1231 Encounter for screening mammogram for malignant neoplasm of breast: Secondary | ICD-10-CM

## 2014-03-25 ENCOUNTER — Encounter (HOSPITAL_BASED_OUTPATIENT_CLINIC_OR_DEPARTMENT_OTHER): Payer: 59

## 2014-03-25 ENCOUNTER — Ambulatory Visit (HOSPITAL_COMMUNITY)
Admission: RE | Admit: 2014-03-25 | Discharge: 2014-03-25 | Disposition: A | Discharge status: Home / Self Care | Payer: 59 | Source: Ambulatory Visit | Attending: Hematology and Oncology | Admitting: Hematology and Oncology

## 2014-03-25 ENCOUNTER — Encounter (HOSPITAL_COMMUNITY): Payer: Self-pay

## 2014-03-25 VITALS — BP 130/62 | HR 90 | Temp 99.2°F | Resp 18 | Wt 178.0 lb

## 2014-03-25 DIAGNOSIS — I8002 Phlebitis and thrombophlebitis of superficial vessels of left lower extremity: Secondary | ICD-10-CM

## 2014-03-25 DIAGNOSIS — I8000 Phlebitis and thrombophlebitis of superficial vessels of unspecified lower extremity: Secondary | ICD-10-CM | POA: Diagnosis present

## 2014-03-25 DIAGNOSIS — Z86718 Personal history of other venous thrombosis and embolism: Secondary | ICD-10-CM

## 2014-03-25 DIAGNOSIS — Z23 Encounter for immunization: Secondary | ICD-10-CM

## 2014-03-25 MED ORDER — INFLUENZA VAC SPLIT QUAD 0.5 ML IM SUSY
0.5000 mL | PREFILLED_SYRINGE | Freq: Once | INTRAMUSCULAR | Status: AC
  Administered 2014-03-25: 0.5 mL via INTRAMUSCULAR

## 2014-03-25 MED ORDER — INFLUENZA VAC SPLIT QUAD 0.5 ML IM SUSY
PREFILLED_SYRINGE | INTRAMUSCULAR | Status: AC
  Filled 2014-03-25: qty 0.5

## 2014-03-25 NOTE — Patient Instructions (Signed)
[facility] Discharge Instructions  RECOMMENDATIONS MADE BY THE CONSULTANT AND ANY TEST RESULTS WILL BE SENT TO YOUR REFERRING PHYSICIAN.  Finish the remainder of your Xarelto prescription.  No further follow-up with our clinic is required.   Thank you for choosing [facility] to provide your oncology and hematology care.  To afford each patient quality time with our providers, please arrive at least 15 minutes before your scheduled appointment time.  With your help, our goal is to use those 15 minutes to complete the necessary work-up to ensure our physicians have the information they need to help with your evaluation and healthcare recommendations.    Effective January 1st, 2014, we ask that you re-schedule your appointment with our physicians should you arrive 10 or more minutes late for your appointment.  We strive to give you quality time with our providers, and arriving late affects you and other patients whose appointments are after yours.    Again, thank you for choosing [facility].  Our hope is that these requests will decrease the amount of time that you wait before being seen by our physicians.       _____________________________________________________________  Should you have questions after your visit to [facility], please contact our office at [phone] between the hours of 8:30 a.m. and 4:30 p.m.  Voicemails left after 4:30 p.m. will not be returned until the following business day.  For prescription refill requests, have your pharmacy contact our office with your prescription refill request.    _______________________________________________________________  We hope that we have given you very good care.  You may receive a patient satisfaction survey in the mail, please complete it and return it as soon as possible.  We value your feedback!  _______________________________________________________________  Have  you asked about our STAR program?  STAR stands for Survivorship Training and Rehabilitation, and this is a nationally recognized cancer care program that focuses on survivorship and rehabilitation.  Cancer and cancer treatments may cause problems, such as, pain, making you feel tired and keeping you from doing the things that you need or want to do. Cancer rehabilitation can help. Our goal is to reduce these troubling effects and help you have the best quality of life possible.  You may receive a survey from a nurse that asks questions about your current state of health.  Based on the survey results, all eligible patients will be referred to the [facility] program for an evaluation so we can better serve you!  A frequently asked questions sheet is available upon request.

## 2014-03-25 NOTE — Progress Notes (Signed)
[facility]  OFFICE PROGRESS NOTE  Tula Nakayama, MD [address] [place] [address]  DIAGNOSIS: Saphenous vein thrombophlebitis, left  Chief Complaint  Patient presents with  . Left lower extremity saphenous vein thrombosis    CURRENT THERAPY: Xarelto 15 mg twice a day.  INTERVAL HISTORY: Tracy Humphrey 63 y.o. female returns for followup of saphenous vein thrombosis diagnosed by ultrasound approximately 3 weeks ago.  The patient has been taking Xarelto 15 mg twice a day along with 81 mg of aspirin daily. She denies any chest pain, PND, orthopnea, palpitations, left lower extremity pain or swelling, skin rash, headache, or seizures.  MEDICAL HISTORY: Past Medical History  Diagnosis Date  . Obesity 2007     Prior to this had no weight issue   . GERD (gastroesophageal reflux disease)     INTERIM HISTORY: has OBESITY, UNSPECIFIED; VERTIGO; Prediabetes; Dyslipidemia, goal LDL below 072; Metabolic syndrome X; Insomnia; Palpitations; Dyspepsia; Helicobacter pylori ab+; Obesity (BMI 30.0-34.9); Stress; Allergic sinusitis; Pain of left leg; Back pain; and Acute superficial venous thrombosis of left lower extremity on her problem list.    ALLERGIES:  is allergic to prednisone and penicillins.  MEDICATIONS: has a current medication list which includes the following prescription(s): aspirin, calcium carbonate, loratadine, multiple vitamins-minerals, pseudoephedrine, and rivaroxaban.  SURGICAL HISTORY:  Past Surgical History  Procedure Laterality Date  . Cesarean section  29 years ago   . Right foot surgery 2003. 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 Colonoscopy  02/13/2005    Dr. Gala Romney- normal rectum, normal colon  . Esophagogastroduodenoscopy N/A 06/26/2013    Procedure: ESOPHAGOGASTRODUODENOSCOPY (EGD);  Surgeon: Daneil Dolin, MD;  Location: [facility];  Service: Endoscopy;   Laterality: N/A;  8:00    FAMILY HISTORY: family history includes Alcohol abuse in her brother; Coronary artery disease (age of onset: 62) in her sister; Dementia (age of onset: 1) in her mother; Diabetes in her father; Heart disease in her father; Hypertension in her father, mother, and sister; Hypertension (age of onset: 35) in her brother.  SOCIAL HISTORY:  reports that she has never smoked. She does not have any smokeless tobacco history on file. She reports that she does not drink alcohol or use illicit drugs.  REVIEW OF SYSTEMS:  Other than that discussed above is noncontributory.  PHYSICAL EXAMINATION: ECOG PERFORMANCE STATUS: 0 - Asymptomatic  There were no vitals taken for this visit.  GENERAL:alert, no distress and comfortable SKIN: skin color, texture, turgor are normal, no rashes or significant lesions EYES: PERLA; Conjunctiva are pink and non-injected, sclera clear SINUSES: No redness or tenderness over maxillary or ethmoid sinuses OROPHARYNX:no exudate, no erythema on lips, buccal mucosa, or tongue. NECK: supple, thyroid normal size, non-tender, without nodularity. No masses CHEST:  Normal AP diameter with no breast masses. LYMPH:  no palpable lymphadenopathy in the cervical, axillary or inguinal LUNGS: clear to auscultation and percussion with normal breathing effort HEART: regular rate & rhythm and no murmurs. ABDOMEN:abdomen soft, non-tender and normal bowel sounds MUSCULOSKELETAL:no cyanosis of digits and no clubbing. Range of motion normal.  No muscle tenderness. Homans sign negative. NEURO: alert & oriented x 3 with fluent speech, no focal motor/sensory deficits   LABORATORY DATA: Lab on 03/10/2014  Component Date Value Ref Range Status  . WBC 03/10/2014 9.7  4.0 - 10.5 K/uL Final  . RBC 03/10/2014 4.81  3.87 - 5.11 MIL/uL Final  . Hemoglobin 03/10/2014 14.6  12.0 - 15.0 g/dL Final  . HCT 03/10/2014 42.2  36.0 - 46.0 % Final  . MCV 03/10/2014 87.7  78.0 -  100.0 fL Final  . MCH 03/10/2014 30.4  26.0 - 34.0 pg Final  . MCHC 03/10/2014 34.6  30.0 - 36.0 g/dL Final  . RDW 03/10/2014 13.0  11.5 - 15.5 % Final  . Platelets 03/10/2014 161  150 - 400 K/uL Final  . Neutrophils Relative % 03/10/2014 66  43 - 77 % Final  . Neutro Abs 03/10/2014 6.4  1.7 - 7.7 K/uL Final  . Lymphocytes Relative 03/10/2014 26  12 - 46 % Final  . Lymphs Abs 03/10/2014 2.5  0.7 - 4.0 K/uL Final  . Monocytes Relative 03/10/2014 6  3 - 12 % Final  . Monocytes Absolute 03/10/2014 0.6  0.1 - 1.0 K/uL Final  . Eosinophils Relative 03/10/2014 2  0 - 5 % Final  . Eosinophils Absolute 03/10/2014 0.2  0.0 - 0.7 K/uL Final  . Basophils Relative 03/10/2014 0  0 - 1 % Final  . Basophils Absolute 03/10/2014 0.0  0.0 - 0.1 K/uL Final  . Sodium 03/10/2014 140  137 - 147 mEq/L Final  . Potassium 03/10/2014 4.3  3.7 - 5.3 mEq/L Final  . Chloride 03/10/2014 102  96 - 112 mEq/L Final  . CO2 03/10/2014 27  19 - 32 mEq/L Final  . Glucose, Bld 03/10/2014 133* 70 - 99 mg/dL Final  . BUN 03/10/2014 19  6 - 23 mg/dL Final  . Creatinine, Ser 03/10/2014 0.65  0.50 - 1.10 mg/dL Final  . Calcium 03/10/2014 9.4  8.4 - 10.5 mg/dL Final  . Total Protein 03/10/2014 7.5  6.0 - 8.3 g/dL Final  . Albumin 03/10/2014 3.7  3.5 - 5.2 g/dL Final  . AST 03/10/2014 22  0 - 37 U/L Final  . ALT 03/10/2014 21  0 - 35 U/L Final  . Alkaline Phosphatase 03/10/2014 92  39 - 117 U/L Final  . Total Bilirubin 03/10/2014 0.6  0.3 - 1.2 mg/dL Final  . GFR calc non Af Amer 03/10/2014 >90  >90 mL/min Final  . GFR calc Af Amer 03/10/2014 >90  >90 mL/min Final   Comment: (NOTE)                          The eGFR has been calculated using the CKD EPI equation.                          This calculation has not been validated in all clinical situations.                          eGFR's persistently <90 mL/min signify possible Chronic Kidney                          Disease.  . Anion gap 03/10/2014 11  5 - 15 Final  .  AntiThromb III Func 03/10/2014 100  75 - 120 % Final   Performed at [facility]  . Protein C Activity 03/10/2014 200* 75 - 133 % Final   Performed at [facility]  . Protein C, Total 03/10/2014 98  72 - 160 % Final   Comment: ** Please note change in reference range(s). **  Performed at [facility]  . Protein S Activity 03/10/2014 168* 69 - 129 % Final   Performed at [facility]  . Protein S Ag, Total 03/10/2014 99  60 - 150 % Final   Performed at [facility]  . PTT Lupus Anticoagulant 03/10/2014 65.1* 28.0 - 43.0 secs Final  . PTTLA Confirmation 03/10/2014 8.0* <8.0 secs Final  . PTTLA 4:1 Mix 03/10/2014 57.7* 28.0 - 43.0 secs Final  . Drvvt 03/10/2014 71.8* <42.9 secs Final  . Drvvt confirmation 03/10/2014 1.17* <1.15 Ratio Final  . dRVVT Incubated 1:1 Mix 03/10/2014 47.0* <42.9 secs Final  . Lupus Anticoagulant 03/10/2014 DETECTED* NOT DETECTED Final   Performed at [facility]  . Beta-2 Glyco I IgG 03/10/2014 2  <20 G Units Final  . Beta-2-Glycoprotein I IgM 03/10/2014 3  <20 M Units Final  . Beta-2-Glycoprotein I IgA 03/10/2014 7  <20 A Units Final   Performed at [facility]  . Homocysteine 03/10/2014 7.2  4.0 - 15.4 umol/L Final   Performed at [facility]  . Interpretation-F5LEID: 03/10/2014 REPORT   Final   Comment: (NOTE)                          This individual is negative (normal) for the Factor V                          Leiden (R506Q) mutation in the Factor V gene.                          Increased risk of thrombophilia can be caused by a                          variety of genetic and non-genetic factors not                          screened for by this assay.  . Recommendations-F5LEID: 03/10/2014 REPORT   Final   FACTOR V LEIDEN (R506Q) MUTATION NOT DETECTED  . Reviewer 03/10/2014 REPORT   Final   Comment: (NOTE)                          Masamichi Ito, Ph.D., [facility]                           Director, Molecular Genetics                          SUPPLEMENTAL INFORMATION                          The Factor V Leiden (R506Q) mutation [[id number]:c.                          1601G>A (p.R534Q)] in the Factor V gene is one of the                          most common causes of inherited thrombophilia.  This                          mutation  causes resistance to degradation of activated                          Factor V protein by activated Protein C (APC).                          The Factor V Leiden (R506Q) mutation is detected by                          amplification of the selected region of Factor V gene                          by polymerase chain reaction (PCR) and fluorescent                          probe hybridization to the targeted region, followed by                          melting curve analysis with a real time PCR system.                          Although rare, false positive or false negative results                          may occur.  All results should be interpreted in                          context of clinical findings, relevant history, and                          other laboratory data.                          Health care providers, please contact your local                          [place] genetic counselor or call                          [phone] [phone]) for assistance with                          interpretation of these results.                          This test was developed and its performance                          characteristics have been determined by [facility], [place], [place].                          It has not been cleared or approved by the U.S.  Food and Drug Administration. The FDA has determined                          that such clearance or approval is not necessary.                          Performance characteristics refer  to the analytical                          performance of the test.                          Performed at [facility]  . Interpretation-PTGENE: 03/10/2014 REPORT   Final   Comment: (NOTE)                          This individual is negative (normal) for the G20210A                          mutation in the Prothrombin/Factor II gene.  Increased                          risk of thrombophilia can be caused by a variety of                          genetic and non-genetic factors not screened for by                          this assay.  . Recommendations-PTGENE: 03/10/2014 REPORT   Final   THE G20210A MUTATION NOT DETECTED  . Reviewer 03/10/2014 REPORT   Final   Comment: (NOTE)                          Masamichi Ito, Ph.D., [facility]                          Director, Molecular Genetics                          SUPPLEMENTAL INFORMATION                          The G20210A mutation [[id number]:g.21538G>A (c.*97G>A)]                          in the Prothrombin/Factor II gene is the second most                          common inherited risk factor for thrombosis occurring                          in approximately 2% of Caucasians.  Presence of the                          mutation is associated with an elevation of prothrombin  levels to about 30% above normal in heterozygotes and                          to 70% above normal in homozygotes.                          The G20210A mutation is detected by amplification of                          the selected region of Factor II gene by polymerase                          chain reaction (PCR) and fluorescent probe                          hybridization to the targeted region, followed by                          melting curve analysis with a real time PCR system.                          Although rare, false positive or false negative results                          may occur.  All results should be interpreted in                           context of clinical findings, relevant history, and                          other laboratory data.                          Health care providers, please contact your local                          [place] genetic counselor or call                          [phone]) for assistance with                          interpretation of these results.                          This test was developed and its performance                          characteristics have been determined by [facility], [place], [place].                          It has not been cleared or approved by the U.S.  Food and Drug Administration. The FDA has determined                          that such clearance or approval is not necessary.                          Performance characteristics refer to the analytical                          performance of the test.                          Performed at [facility]  . Anticardiolipin IgG 03/10/2014 3* <23 GPL U/mL Final  . Anticardiolipin IgM 03/10/2014 4* <11 MPL U/mL Final  . Anticardiolipin IgA 03/10/2014 7* <22 APL U/mL Final   Comment: (NOTE)                          Reference Range:  Cardiolipin IgG                            Normal                  <23                            Low Positive (+)        23-35                            Moderate Positive (+)   36-50                            High Positive (+)       >50                          Reference Range:  Cardiolipin IgM                            Normal                  <11                            Low Positive (+)        11-20                            Moderate Positive (+)   21-30                            High Positive (+)       >30                          Reference Range:  Cardiolipin IgA                            Normal                  <  22                            Low  Positive (+)        22-35                            Moderate Positive (+)   36-45                            High Positive (+)       >45                          Performed at [place]: No new pathology  Urinalysis No results found for this basename: colorurine,  appearanceur,  labspec,  phurine,  glucoseu,  hgbur,  bilirubinur,  ketonesur,  proteinur,  urobilinogen,  nitrite,  leukocytesur    RADIOGRAPHIC STUDIES: Dg Lumbar Spine Complete  03/05/2014   CLINICAL DATA:  Low back pain.  No known injury.  EXAM: LUMBAR SPINE - COMPLETE 4+ VIEW  COMPARISON:  Lumbar MRI, 08/31/2003.  FINDINGS: No fracture. No spondylolisthesis. Disc spaces are relatively well preserved. Small endplate osteophytes are noted from L1 through L4. Mild facet degenerative change noted at L5-S1. Soft tissues are unremarkable.  IMPRESSION: No fracture or acute finding.  Minor degenerative change.   Electronically Signed   By: Lajean Manes M.D.   On: 03/05/2014 10:59   US Venous Img Lower Unilateral Left  03/25/2014   CLINICAL DATA:  Compared to previous study done in August regarding saphenous vein inflammation.  EXAM: LEFT LOWER EXTREMITY VENOUS DOPPLER ULTRASOUND  TECHNIQUE: Gray-scale sonography with compression, as well as color and duplex ultrasound, were performed to evaluate the deep venous system from the level of the common femoral vein through the popliteal and proximal calf veins.  COMPARISON:  03/05/2014  FINDINGS: Normal compressibility of the common femoral, superficial femoral, and popliteal veins, as well as the proximal calf veins. No filling defects to suggest DVT on grayscale or color Doppler imaging. Doppler waveforms show normal direction of venous flow, normal respiratory phasicity and response to augmentation. Visualized segments of the saphenous venous system normal in caliber and compressibility. Superficial thrombophlebitis previously noted in the greater saphenous vein at the  midthigh level has resolved and this segment remains patent and compressible with normal color signal.  IMPRESSION: 1.  No evidence of lower extremity deep vein thrombosis,LEFT.  2. Resolution of previously noted superficial thrombophlebitis in the greater saphenous vein.   Electronically Signed   By: Arne Cleveland M.D.   On: 03/25/2014 11:43   US Venous Img Lower Unilateral Left  03/05/2014   CLINICAL DATA:  Left leg pain  EXAM: Left LOWER EXTREMITY VENOUS DOPPLER ULTRASOUND  TECHNIQUE: Gray-scale sonography with graded compression, as well as color Doppler and duplex ultrasound were performed to evaluate the lower extremity deep venous systems from the level of the common femoral vein and including the common femoral, femoral, profunda femoral, popliteal and calf veins including the posterior tibial, peroneal and gastrocnemius veins when visible. The superficial great saphenous vein was also interrogated. Spectral Doppler was utilized to evaluate flow at rest and with distal augmentation maneuvers in the common femoral, femoral and popliteal veins.  COMPARISON:  None.  FINDINGS: Common Femoral Vein: No evidence of thrombus. Normal  compressibility, respiratory phasicity and response to augmentation.  Saphenofemoral Junction: No evidence of thrombus. Normal compressibility and flow on color Doppler imaging.  Profunda Femoral Vein: No evidence of thrombus. Normal compressibility and flow on color Doppler imaging.  Femoral Vein: No evidence of thrombus. Normal compressibility, respiratory phasicity and response to augmentation.  Popliteal Vein: No evidence of thrombus. Normal compressibility, respiratory phasicity and response to augmentation.  Calf Veins: No evidence of thrombus. Normal compressibility and flow on color Doppler imaging.  Superficial Great Saphenous Vein: Occlusive thrombus is noted in the mid thigh within the greater saphenous vein.  Venous Reflux:  None.  Other Findings:  None.  IMPRESSION: No  evidence of deep venous thrombosis.  Superficial venous thrombosis is noted in the midportion of the greater saphenous vein. The saphenous femoral junction is patent.   Electronically Signed   By: Inez Catalina M.D.   On: 03/05/2014 11:59    ASSESSMENT:  #1. Superficial left saphenous vein thrombophlebitis, resolved. #2. No evidence of hypercoagulable syndrome.   PLAN:  #1. Continue aspirin 81 mg daily. #2. No followup was scheduled in this office.   All questions were answered. The patient knows to call the clinic with any problems, questions or concerns. We can certainly see the patient much sooner if necessary.   I spent 25 minutes counseling the patient face to face. The total time spent in the appointment was 30 minutes.    Doroteo Bradford, MD 03/25/2014 2:56 PM  DISCLAIMER:  This note was dictated with voice recognition software.  Similar sounding words can inadvertently be transcribed inaccurately and may not be corrected upon review.

## 2014-04-23 ENCOUNTER — Other Ambulatory Visit: Payer: Self-pay | Admitting: Gynecology

## 2014-04-27 LAB — CYTOLOGY - PAP

## 2014-05-01 ENCOUNTER — Ambulatory Visit (HOSPITAL_COMMUNITY): Payer: 59

## 2014-05-01 ENCOUNTER — Ambulatory Visit (HOSPITAL_COMMUNITY)
Admission: RE | Admit: 2014-05-01 | Discharge: 2014-05-01 | Disposition: A | Discharge status: Home / Self Care | Payer: 59 | Source: Ambulatory Visit | Attending: Gynecology | Admitting: Gynecology

## 2014-05-01 DIAGNOSIS — Z1231 Encounter for screening mammogram for malignant neoplasm of breast: Secondary | ICD-10-CM | POA: Diagnosis present

## 2014-05-08 ENCOUNTER — Ambulatory Visit: Payer: Self-pay | Admitting: Family Medicine

## 2014-05-18 ENCOUNTER — Other Ambulatory Visit: Payer: Self-pay

## 2014-05-18 DIAGNOSIS — J309 Allergic rhinitis, unspecified: Secondary | ICD-10-CM

## 2014-05-18 MED ORDER — LORATADINE 10 MG PO TABS: 10.0000 mg | ORAL_TABLET | Freq: Every day | ORAL | Status: DC

## 2014-06-15 ENCOUNTER — Telehealth: Payer: Self-pay | Admitting: Family Medicine

## 2014-06-15 ENCOUNTER — Other Ambulatory Visit: Payer: Self-pay

## 2014-06-15 MED ORDER — PROMETHAZINE-DM 6.25-15 MG/5ML PO SYRP: 5.0000 mL | ORAL_SOLUTION | Freq: Every evening | ORAL | Status: DC | PRN

## 2014-06-15 NOTE — Telephone Encounter (Signed)
Excess cough , sneezing, watery nasal drainage since past 5 days. No fever, chills or yellow sputum Taking daily allergy pill as well as decongestant twice daily (sudafed) will add phenergan dm at night as needed, for excess cough, states did not sleep  Last night. Taking loratidine , advised her to consider a trial of   zyrtec in its place Med sent to pharmacy, phenergan dm

## 2014-07-07 ENCOUNTER — Encounter: Payer: Self-pay | Admitting: Family Medicine

## 2014-07-07 LAB — HEMOGLOBIN A1C
Hgb A1c MFr Bld: 6.3 % — ABNORMAL HIGH (ref <5.7)
MEAN PLASMA GLUCOSE: 134 mg/dL — AB (ref <117)

## 2014-07-07 LAB — COMPREHENSIVE METABOLIC PANEL
ALT: 22 U/L (ref 0–35)
AST: 19 U/L (ref 0–37)
Albumin: 4.1 g/dL (ref 3.5–5.2)
Alkaline Phosphatase: 87 U/L (ref 39–117)
BUN: 18 mg/dL (ref 6–23)
CALCIUM: 9.8 mg/dL (ref 8.4–10.5)
CHLORIDE: 102 meq/L (ref 96–112)
CO2: 31 mEq/L (ref 19–32)
Creat: 0.77 mg/dL (ref 0.50–1.10)
GLUCOSE: 107 mg/dL — AB (ref 70–99)
Potassium: 4.9 mEq/L (ref 3.5–5.3)
Sodium: 140 mEq/L (ref 135–145)
Total Bilirubin: 0.7 mg/dL (ref 0.2–1.2)
Total Protein: 7.1 g/dL (ref 6.0–8.3)

## 2014-07-07 LAB — LIPID PANEL
Cholesterol: 207 mg/dL — ABNORMAL HIGH (ref 0–200)
HDL: 47 mg/dL (ref >39)
LDL Cholesterol: 129 mg/dL — ABNORMAL HIGH (ref 0–99)
Total CHOL/HDL Ratio: 4.4 Ratio
Triglycerides: 155 mg/dL — ABNORMAL HIGH (ref <150)
VLDL: 31 mg/dL (ref 0–40)

## 2014-07-07 LAB — TSH: TSH: 3.771 u[IU]/mL (ref 0.350–4.500)

## 2014-07-07 LAB — CBC
HEMATOCRIT: 41.8 % (ref 36.0–46.0)
HEMOGLOBIN: 14.5 g/dL (ref 12.0–15.0)
MCH: 29.9 pg (ref 26.0–34.0)
MCHC: 34.7 g/dL (ref 30.0–36.0)
MCV: 86.2 fL (ref 78.0–100.0)
MPV: 11.2 fL (ref 8.6–12.4)
Platelets: 196 10*3/uL (ref 150–400)
RBC: 4.85 MIL/uL (ref 3.87–5.11)
RDW: 13.2 % (ref 11.5–15.5)
WBC: 9.3 10*3/uL (ref 4.0–10.5)

## 2014-07-08 NOTE — Addendum Note (Signed)
Addended by: Kandis FantasiaSLADE, Tamela Elsayed B on: 07/08/2014 10:23 AM   Modules accepted: Orders

## 2014-07-15 NOTE — Patient Instructions (Signed)
Your procedure is scheduled on: 07/21/2014  Report to [facility] at  730  AM.  Call this number if you have problems the morning of surgery: [phone]   Do not eat food or drink liquids :After Midnight.      Take these medicines the morning of surgery with A SIP OF WATER: claritin, sudaphed   Do not wear jewelry, make-up or nail polish.  Do not wear lotions, powders, or perfumes.  Do not shave 48 hours prior to surgery.  Do not bring valuables to the hospital.  Contacts, dentures or bridgework may not be worn into surgery.  Leave suitcase in the car. After surgery it may be brought to your room.  For patients admitted to the hospital, checkout time is 11:00 AM the day of discharge.   Patients discharged the day of surgery will not be allowed to drive home.  :     Please read over the following fact sheets that you were given: Coughing and Deep Breathing, Surgical Site Infection Prevention, Anesthesia Post-op Instructions and Care and Recovery After Surgery    Cataract A cataract is a clouding of the lens of the eye. When a lens becomes cloudy, vision is reduced based on the degree and nature of the clouding. Many cataracts reduce vision to some degree. Some cataracts make people more near-sighted as they develop. Other cataracts increase glare. Cataracts that are ignored and become worse can sometimes look white. The white color can be seen through the pupil. CAUSES   Aging. However, cataracts may occur at any age, even in newborns.   Certain drugs.   Trauma to the eye.   Certain diseases such as diabetes.   Specific eye diseases such as chronic inflammation inside the eye or a sudden attack of a rare form of glaucoma.   Inherited or acquired medical problems.  SYMPTOMS   Gradual, progressive drop in vision in the affected eye.   Severe, rapid visual loss. This most often happens when trauma is the cause.  DIAGNOSIS  To detect a cataract, an eye doctor examines the lens.  Cataracts are best diagnosed with an exam of the eyes with the pupils enlarged (dilated) by drops.  TREATMENT  For an early cataract, vision may improve by using different eyeglasses or stronger lighting. If that does not help your vision, surgery is the only effective treatment. A cataract needs to be surgically removed when vision loss interferes with your everyday activities, such as driving, reading, or watching TV. A cataract may also have to be removed if it prevents examination or treatment of another eye problem. Surgery removes the cloudy lens and usually replaces it with a substitute lens (intraocular lens, IOL).  At a time when both you and your doctor agree, the cataract will be surgically removed. If you have cataracts in both eyes, only one is usually removed at a time. This allows the operated eye to heal and be out of danger from any possible problems after surgery (such as infection or poor wound healing). In rare cases, a cataract may be doing damage to your eye. In these cases, your caregiver may advise surgical removal right away. The vast majority of people who have cataract surgery have better vision afterward. HOME CARE INSTRUCTIONS  If you are not planning surgery, you may be asked to do the following:  Use different eyeglasses.   Use stronger or brighter lighting.   Ask your eye doctor about reducing your medicine dose or changing medicines if  it is thought that a medicine caused your cataract. Changing medicines does not make the cataract go away on its own.   Become familiar with your surroundings. Poor vision can lead to injury. Avoid bumping into things on the affected side. You are at a higher risk for tripping or falling.   Exercise extreme care when driving or operating machinery.   Wear sunglasses if you are sensitive to bright light or experiencing problems with glare.  SEEK IMMEDIATE MEDICAL CARE IF:   You have a worsening or sudden vision loss.   You notice  redness, swelling, or increasing pain in the eye.   You have a fever.  Document Released: 06/26/2005 Document Revised: 06/15/2011 Document Reviewed: 02/17/2011 [facility] Patient Information 2012 [place].PATIENT INSTRUCTIONS POST-ANESTHESIA  IMMEDIATELY FOLLOWING SURGERY:  Do not drive or operate machinery for the first twenty four hours after surgery.  Do not make any important decisions for twenty four hours after surgery or while taking narcotic pain medications or sedatives.  If you develop intractable nausea and vomiting or a severe headache please notify your doctor immediately.  FOLLOW-UP:  Please make an appointment with your surgeon as instructed. You do not need to follow up with anesthesia unless specifically instructed to do so.  WOUND CARE INSTRUCTIONS (if applicable):  Keep a dry clean dressing on the anesthesia/puncture wound site if there is drainage.  Once the wound has quit draining you may leave it open to air.  Generally you should leave the bandage intact for twenty four hours unless there is drainage.  If the epidural site drains for more than 36-48 hours please call the anesthesia department.  QUESTIONS?:  Please feel free to call your physician or the hospital operator if you have any questions, and they will be happy to assist you.

## 2014-07-16 ENCOUNTER — Encounter (HOSPITAL_COMMUNITY)
Admission: RE | Admit: 2014-07-16 | Discharge: 2014-07-16 | Disposition: A | Discharge status: Home / Self Care | Payer: 59 | Source: Ambulatory Visit | Attending: Ophthalmology | Admitting: Ophthalmology

## 2014-07-16 ENCOUNTER — Encounter (HOSPITAL_COMMUNITY): Payer: Self-pay

## 2014-07-16 DIAGNOSIS — Z01818 Encounter for other preprocedural examination: Secondary | ICD-10-CM | POA: Insufficient documentation

## 2014-07-20 MED ORDER — PHENYLEPHRINE HCL 2.5 % OP SOLN
OPHTHALMIC | Status: AC
  Filled 2014-07-20: qty 15

## 2014-07-20 MED ORDER — KETOROLAC TROMETHAMINE 0.5 % OP SOLN
OPHTHALMIC | Status: AC
Start: 2014-07-20 — End: 2014-07-20
  Filled 2014-07-20: qty 5

## 2014-07-20 MED ORDER — CYCLOPENTOLATE-PHENYLEPHRINE OP SOLN OPTIME - NO CHARGE
OPHTHALMIC | Status: AC
  Filled 2014-07-20: qty 2

## 2014-07-20 MED ORDER — TETRACAINE HCL 0.5 % OP SOLN
OPHTHALMIC | Status: AC
  Filled 2014-07-20: qty 2

## 2014-07-21 ENCOUNTER — Ambulatory Visit (HOSPITAL_COMMUNITY): Payer: 59 | Admitting: Anesthesiology

## 2014-07-21 ENCOUNTER — Encounter (HOSPITAL_COMMUNITY)
Admission: RE | Disposition: A | Discharge status: Home / Self Care | Payer: Self-pay | Source: Ambulatory Visit | Attending: Ophthalmology

## 2014-07-21 ENCOUNTER — Ambulatory Visit (HOSPITAL_COMMUNITY)
Admission: RE | Admit: 2014-07-21 | Discharge: 2014-07-21 | Disposition: A | Discharge status: Home / Self Care | Payer: 59 | Source: Ambulatory Visit | Attending: Ophthalmology | Admitting: Ophthalmology

## 2014-07-21 ENCOUNTER — Encounter (HOSPITAL_COMMUNITY): Payer: Self-pay | Admitting: *Deleted

## 2014-07-21 DIAGNOSIS — H2512 Age-related nuclear cataract, left eye: Secondary | ICD-10-CM | POA: Diagnosis present

## 2014-07-21 HISTORY — PX: CATARACT EXTRACTION W/PHACO: SHX586

## 2014-07-21 LAB — GLUCOSE, CAPILLARY: Glucose-Capillary: 107 mg/dL — ABNORMAL HIGH (ref 70–99)

## 2014-07-21 SURGERY — PHACOEMULSIFICATION, CATARACT, WITH IOL INSERTION
Anesthesia: Monitor Anesthesia Care | Laterality: Left

## 2014-07-21 MED ORDER — EPINEPHRINE HCL 1 MG/ML IJ SOLN
INTRAOCULAR | Status: DC | PRN
  Administered 2014-07-21: 500 mL

## 2014-07-21 MED ORDER — FENTANYL CITRATE 0.05 MG/ML IJ SOLN
25.0000 ug | INTRAMUSCULAR | Status: AC
  Administered 2014-07-21 (×2): 25 ug via INTRAVENOUS

## 2014-07-21 MED ORDER — TETRACAINE HCL 0.5 % OP SOLN
1.0000 [drp] | OPHTHALMIC | Status: AC
  Administered 2014-07-21 (×3): 1 [drp] via OPHTHALMIC

## 2014-07-21 MED ORDER — EPINEPHRINE HCL 1 MG/ML IJ SOLN
INTRAMUSCULAR | Status: AC
  Filled 2014-07-21: qty 1

## 2014-07-21 MED ORDER — MIDAZOLAM HCL 2 MG/2ML IJ SOLN
1.0000 mg | INTRAMUSCULAR | Status: DC | PRN
  Administered 2014-07-21: 2 mg via INTRAVENOUS

## 2014-07-21 MED ORDER — CYCLOPENTOLATE-PHENYLEPHRINE 0.2-1 % OP SOLN
1.0000 [drp] | OPHTHALMIC | Status: AC
  Administered 2014-07-21 (×3): 1 [drp] via OPHTHALMIC

## 2014-07-21 MED ORDER — PROVISC 10 MG/ML IO SOLN
INTRAOCULAR | Status: DC | PRN
  Administered 2014-07-21: 0.85 mL via INTRAOCULAR

## 2014-07-21 MED ORDER — LACTATED RINGERS IV SOLN
INTRAVENOUS | Status: DC
  Administered 2014-07-21: 1000 mL via INTRAVENOUS

## 2014-07-21 MED ORDER — POVIDONE-IODINE 5 % OP SOLN
OPHTHALMIC | Status: DC | PRN
  Administered 2014-07-21: 1 via OPHTHALMIC

## 2014-07-21 MED ORDER — MIDAZOLAM HCL 2 MG/2ML IJ SOLN
INTRAMUSCULAR | Status: AC
  Filled 2014-07-21: qty 2

## 2014-07-21 MED ORDER — FENTANYL CITRATE 0.05 MG/ML IJ SOLN
INTRAMUSCULAR | Status: AC
  Filled 2014-07-21: qty 2

## 2014-07-21 MED ORDER — TETRACAINE 0.5 % OP SOLN OPTIME - NO CHARGE
OPHTHALMIC | Status: DC | PRN
  Administered 2014-07-21: 1 [drp] via OPHTHALMIC

## 2014-07-21 MED ORDER — PHENYLEPHRINE HCL 2.5 % OP SOLN
1.0000 [drp] | OPHTHALMIC | Status: AC
  Administered 2014-07-21 (×3): 1 [drp] via OPHTHALMIC

## 2014-07-21 MED ORDER — KETOROLAC TROMETHAMINE 0.5 % OP SOLN
1.0000 [drp] | OPHTHALMIC | Status: AC
  Administered 2014-07-21 (×3): 1 [drp] via OPHTHALMIC

## 2014-07-21 MED ORDER — BSS IO SOLN
INTRAOCULAR | Status: DC | PRN
  Administered 2014-07-21: 15 mL

## 2014-07-21 SURGICAL SUPPLY — 25 items
CAPSULAR TENSION RING-AMO (OPHTHALMIC RELATED) IMPLANT
CLOTH BEACON ORANGE TIMEOUT ST (SAFETY) ×2 IMPLANT
EYE SHIELD UNIVERSAL CLEAR (GAUZE/BANDAGES/DRESSINGS) ×2 IMPLANT
GLOVE BIO SURGEON STRL SZ 6.5 (GLOVE) IMPLANT
GLOVE BIO SURGEONS STRL SZ 6.5 (GLOVE)
GLOVE ECLIPSE 6.5 STRL STRAW (GLOVE) ×2 IMPLANT
GLOVE ECLIPSE 7.0 STRL STRAW (GLOVE) IMPLANT
GLOVE EXAM NITRILE LRG STRL (GLOVE) IMPLANT
GLOVE EXAM NITRILE MD LF STRL (GLOVE) ×2 IMPLANT
GLOVE SKINSENSE NS SZ6.5 (GLOVE)
GLOVE SKINSENSE STRL SZ6.5 (GLOVE) IMPLANT
HEALON 5 0.6 ML (INTRAOCULAR LENS) IMPLANT
KIT VITRECTOMY (OPHTHALMIC RELATED) IMPLANT
LENS ALC ACRYL/TECN (Ophthalmic Related) ×2 IMPLANT
PAD ARMBOARD 7.5X6 YLW CONV (MISCELLANEOUS) ×2 IMPLANT
PROC W NO LENS (INTRAOCULAR LENS)
PROC W SPEC LENS (INTRAOCULAR LENS)
PROCESS W NO LENS (INTRAOCULAR LENS) IMPLANT
PROCESS W SPEC LENS (INTRAOCULAR LENS) IMPLANT
RETRACTOR IRIS SIGHTPATH (OPHTHALMIC RELATED) IMPLANT
RING MALYGIN (MISCELLANEOUS) IMPLANT
TAPE SURG TRANSPORE 1 IN (GAUZE/BANDAGES/DRESSINGS) IMPLANT
TAPE SURGICAL TRANSPORE 1 IN (GAUZE/BANDAGES/DRESSINGS) ×2
VISCOELASTIC ADDITIONAL (OPHTHALMIC RELATED) IMPLANT
WATER STERILE IRR 250ML POUR (IV SOLUTION) ×2 IMPLANT

## 2014-07-21 NOTE — Anesthesia Procedure Notes (Signed)
Procedure Name: MAC Date/Time: 07/21/2014 8:43 AM Performed by: Franco NonesYATES, Kimmora Risenhoover S Pre-anesthesia Checklist: Patient identified, Emergency Drugs available, Suction available, Timeout performed and Patient being monitored Patient Re-evaluated:Patient Re-evaluated prior to inductionOxygen Delivery Method: Nasal Cannula

## 2014-07-21 NOTE — Discharge Instructions (Signed)
Tracy ConesJoan S Humphrey  07/21/2014           [facility] Instructions [address]-  [id number] [address]      1. Avoid closing eyes tightly. One often closes the eye tightly when laughing, talking, sneezing, coughing or if they feel irritated. At these times, you should be careful not to close your eyes tightly.  2. Instill eye drops as instructed. To instill drops in your eye, open it, look up and have someone gently pull the lower lid down and instill a couple of drops inside the lower lid.  3. Do not touch upper lid.  4. Take Advil or Tylenol for pain.  5. You may use either eye for near work, such as reading or sewing and you may watch television.  6. You may have your hair done at the beauty parlor at any time.  7. Wear dark glasses with or without your own glasses if you are in bright light.  8. Call our office at [phone] or [phone] if you have sharp pain in your eye or unusual symptoms.  9. Do not be concerned because vision in the operative eye is not good. It will not be good, no matter how successful the operation, until you get a special lens for it. Your old glasses will not be suited to the new eye that was operated on and you will not be ready for a new lens for about a month.  10. [facility].    I have received a copy of the above instructions and will follow them.     Follow up today between 2-2:30 PM

## 2014-07-21 NOTE — Anesthesia Postprocedure Evaluation (Signed)
  Anesthesia Post-op Note  Patient: Tracy ConesJoan S Neff  Procedure(s) Performed: Procedure(s) (LRB): CATARACT EXTRACTION PHACO AND INTRAOCULAR LENS PLACEMENT (IOC) (Left)  Patient Location:  Short Stay  Anesthesia Type: MAC  Level of Consciousness: awake  Airway and Oxygen Therapy: Patient Spontanous Breathing  Post-op Pain: none  Post-op Assessment: Post-op Vital signs reviewed, Patient's Cardiovascular Status Stable, Respiratory Function Stable, Patent Airway, No signs of Nausea or vomiting and Pain level controlled  Post-op Vital Signs: Reviewed and stable  Complications: No apparent anesthesia complications

## 2014-07-21 NOTE — Transfer of Care (Signed)
Immediate Anesthesia Transfer of Care Note  Patient: Tracy Humphrey  Procedure(s) Performed: Procedure(s) (LRB): CATARACT EXTRACTION PHACO AND INTRAOCULAR LENS PLACEMENT (IOC) (Left)  Patient Location: Shortstay  Anesthesia Type: MAC  Level of Consciousness: awake  Airway & Oxygen Therapy: Patient Spontanous Breathing   Post-op Assessment: Report given to PACU RN, Post -op Vital signs reviewed and stable and Patient moving all extremities  Post vital signs: Reviewed and stable  Complications: No apparent anesthesia complications

## 2014-07-21 NOTE — Anesthesia Preprocedure Evaluation (Signed)
Anesthesia Evaluation  Patient identified by MRN, date of birth, ID band Patient awake    Reviewed: Allergy & Precautions, NPO status , Patient's Chart, lab work & pertinent test results  Airway Mallampati: III  TM Distance: <3 FB     Dental  (+) Teeth Intact   Pulmonary neg pulmonary ROS,  breath sounds clear to auscultation        Cardiovascular negative cardio ROS  Rhythm:Regular Rate:Normal     Neuro/Psych    GI/Hepatic   Endo/Other    Renal/GU      Musculoskeletal   Abdominal   Peds  Hematology   Anesthesia Other Findings   Reproductive/Obstetrics                             Anesthesia Physical Anesthesia Plan  ASA: II  Anesthesia Plan: MAC   Post-op Pain Management:    Induction: Intravenous  Airway Management Planned: Nasal Cannula  Additional Equipment:   Intra-op Plan:   Post-operative Plan:   Informed Consent: I have reviewed the patients History and Physical, chart, labs and discussed the procedure including the risks, benefits and alternatives for the proposed anesthesia with the patient or authorized representative who has indicated his/her understanding and acceptance.     Plan Discussed with:   Anesthesia Plan Comments:         Anesthesia Quick Evaluation

## 2014-07-21 NOTE — H&P (Signed)
The patient was re examined and there is no change in the patients condition since the original H and P. 

## 2014-07-21 NOTE — Op Note (Signed)
Patient brought to the operating room and prepped and draped in the usual manner.  Lid speculum inserted in left eye.  Stab incision made at the twelve o'clock position.  Provisc instilled in the anterior chamber.   A 2.4 mm. Stab incision was made temporally.  An anterior capsulotomy was done with a bent 25 gauge needle.  The nucleus was hydrodissected.  The Phaco tip was inserted in the anterior chamber and the nucleus was emulsified.  CDE was 3.91.  The cortical material was then removed with the I and A tip.  Posterior capsule was the polished.  The anterior chamber was deepened with Provisc.  A 22.0 Alcon SN60WF IOL was then inserted in the capsular bag.  Provisc was then removed with the I and A tip.  The wound was then hydrated.  Patient sent to the Recovery Room in good condition with follow up in my office.  Preoperative Diagnosis:  Nuclear Cataract OS Postoperative Diagnosis:  Same Procedure name: Kelman Phacoemulsification OS with IOL

## 2014-07-22 ENCOUNTER — Encounter (HOSPITAL_COMMUNITY): Payer: Self-pay | Admitting: Ophthalmology

## 2014-07-27 ENCOUNTER — Encounter (HOSPITAL_COMMUNITY): Payer: Self-pay | Admitting: Ophthalmology

## 2014-07-28 NOTE — Patient Instructions (Signed)
Your procedure is scheduled on:  Tuesday, 08/04/14  [facility]  [facility].  Call this number if you have problems the morning of surgery: [phone]   Remember:   Do not eat or drink   After Midnight.  Take these medicines the morning of surgery with A SIP OF WATER: claritin   Do not wear jewelry, make-up or nail polish.  Do not wear lotions, powders, or perfumes. You may wear deodorant.  Do not bring valuables to the hospital.  Contacts, dentures or bridgework may not be worn into surgery.     Patients discharged the day of surgery will not be allowed to drive home.  Name and phone number of your driver: driver  Special Instructions: Use eye drops as directed.   Please read over the following fact sheets that you were given: Pain Booklet, Anesthesia Post-op Instructions and Care and Recovery After Surgery    Cataract Surgery  A cataract is a clouding of the lens of the eye. When a lens becomes cloudy, vision is reduced based on the degree and nature of the clouding. Surgery may be needed to improve vision. Surgery removes the cloudy lens and usually replaces it with a substitute lens (intraocular lens, IOL). LET YOUR EYE DOCTOR KNOW ABOUT:  Allergies to food or medicine.   Medicines taken including herbs, eyedrops, over-the-counter medicines, and creams.   Use of steroids (by mouth or creams).   Previous problems with anesthetics or numbing medicine.   History of bleeding problems or blood clots.   Previous surgery.   Other health problems, including diabetes and kidney problems.   Possibility of pregnancy, if this applies.  RISKS AND COMPLICATIONS  Infection.   Inflammation of the eyeball (endophthalmitis) that can spread to both eyes (sympathetic ophthalmia).   Poor wound healing.   If an IOL is inserted, it can later fall out of proper position. This is very uncommon.   Clouding of the part of your eye that holds an IOL in place. This is called an  "after-cataract." These are uncommon, but easily treated.  BEFORE THE PROCEDURE  Do not eat or drink anything except small amounts of water for 8 to 12 before your surgery, or as directed by your caregiver.   Unless you are told otherwise, continue any eyedrops you have been prescribed.   Talk to your primary caregiver about all other medicines that you take (both prescription and non-prescription). In some cases, you may need to stop or change medicines near the time of your surgery. This is most important if you are taking blood-thinning medicine.Do not stop medicines unless you are told to do so.   Arrange for someone to drive you to and from the procedure.   Do not put contact lenses in either eye on the day of your surgery.  PROCEDURE There is more than one method for safely removing a cataract. Your doctor can explain the differences and help determine which is best for you. Phacoemulsification surgery is the most common form of cataract surgery.  An injection is given behind the eye or eyedrops are given to make this a painless procedure.   A small cut (incision) is made on the edge of the clear, dome-shaped surface that covers the front of the eye (cornea).   A tiny probe is painlessly inserted into the eye. This device gives off ultrasound waves that soften and break up the cloudy center of the lens. This makes it easier for  the cloudy lens to be removed by suction.   An IOL may be implanted.   The normal lens of the eye is covered by a clear capsule. Part of that capsule is intentionally left in the eye to support the IOL.   Your surgeon may or may not use stitches to close the incision.  There are other forms of cataract surgery that require a larger incision and stiches to close the eye. This approach is taken in cases where the doctor feels that the cataract cannot be easily removed using phacoemulsification. AFTER THE PROCEDURE  When an IOL is implanted, it does not need  care. It becomes a permanent part of your eye and cannot be seen or felt.   Your doctor will schedule follow-up exams to check on your progress.   Review your other medicines with your doctor to see which can be resumed after surgery.   Use eyedrops or take medicine as prescribed by your doctor.  Document Released: 06/15/2011 Document Reviewed: 06/12/2011 [facility] Patient Information 2012 [place], [place].  PATIENT INSTRUCTIONS POST-ANESTHESIA  IMMEDIATELY FOLLOWING SURGERY:  Do not drive or operate machinery for the first twenty four hours after surgery.  Do not make any important decisions for twenty four hours after surgery or while taking narcotic pain medications or sedatives.  If you develop intractable nausea and vomiting or a severe headache please notify your doctor immediately.  FOLLOW-UP:  Please make an appointment with your surgeon as instructed. You do not need to follow up with anesthesia unless specifically instructed to do so.  WOUND CARE INSTRUCTIONS (if applicable):  Keep a dry clean dressing on the anesthesia/puncture wound site if there is drainage.  Once the wound has quit draining you may leave it open to air.  Generally you should leave the bandage intact for twenty four hours unless there is drainage.  If the epidural site drains for more than 36-48 hours please call the anesthesia department.  QUESTIONS?:  Please feel free to call your physician or the hospital operator if you have any questions, and they will be happy to assist you.

## 2014-07-29 ENCOUNTER — Encounter (HOSPITAL_COMMUNITY)
Admission: RE | Admit: 2014-07-29 | Discharge: 2014-07-29 | Disposition: A | Discharge status: Home / Self Care | Payer: 59 | Source: Ambulatory Visit | Attending: Ophthalmology | Admitting: Ophthalmology

## 2014-07-30 ENCOUNTER — Encounter (HOSPITAL_COMMUNITY): Payer: Self-pay

## 2014-08-03 MED ORDER — KETOROLAC TROMETHAMINE 0.5 % OP SOLN
OPHTHALMIC | Status: AC
  Filled 2014-08-03: qty 5

## 2014-08-03 MED ORDER — PHENYLEPHRINE HCL 2.5 % OP SOLN
OPHTHALMIC | Status: AC
  Filled 2014-08-03: qty 15

## 2014-08-03 MED ORDER — TETRACAINE HCL 0.5 % OP SOLN
OPHTHALMIC | Status: AC
  Filled 2014-08-03: qty 2

## 2014-08-03 MED ORDER — CYCLOPENTOLATE-PHENYLEPHRINE OP SOLN OPTIME - NO CHARGE
OPHTHALMIC | Status: AC
  Filled 2014-08-03: qty 2

## 2014-08-04 ENCOUNTER — Ambulatory Visit (HOSPITAL_COMMUNITY)
Admission: RE | Admit: 2014-08-04 | Discharge: 2014-08-04 | Disposition: A | Discharge status: Home / Self Care | Payer: 59 | Source: Ambulatory Visit | Attending: Ophthalmology | Admitting: Ophthalmology

## 2014-08-04 ENCOUNTER — Ambulatory Visit (HOSPITAL_COMMUNITY): Payer: 59 | Admitting: Anesthesiology

## 2014-08-04 ENCOUNTER — Encounter (HOSPITAL_COMMUNITY)
Admission: RE | Disposition: A | Discharge status: Home / Self Care | Payer: Self-pay | Source: Ambulatory Visit | Attending: Ophthalmology

## 2014-08-04 DIAGNOSIS — H2511 Age-related nuclear cataract, right eye: Secondary | ICD-10-CM | POA: Diagnosis not present

## 2014-08-04 HISTORY — PX: CATARACT EXTRACTION W/PHACO: SHX586

## 2014-08-04 SURGERY — PHACOEMULSIFICATION, CATARACT, WITH IOL INSERTION
Anesthesia: Monitor Anesthesia Care | Site: Eye | Laterality: Right

## 2014-08-04 MED ORDER — FENTANYL CITRATE 0.05 MG/ML IJ SOLN
INTRAMUSCULAR | Status: AC
  Filled 2014-08-04: qty 2

## 2014-08-04 MED ORDER — MIDAZOLAM HCL 2 MG/2ML IJ SOLN
1.0000 mg | INTRAMUSCULAR | Status: DC | PRN
  Administered 2014-08-04: 2 mg via INTRAVENOUS

## 2014-08-04 MED ORDER — PROVISC 10 MG/ML IO SOLN
INTRAOCULAR | Status: DC | PRN
  Administered 2014-08-04: 0.85 mL via INTRAOCULAR

## 2014-08-04 MED ORDER — EPINEPHRINE HCL 1 MG/ML IJ SOLN
INTRAMUSCULAR | Status: AC
  Filled 2014-08-04: qty 1

## 2014-08-04 MED ORDER — BSS IO SOLN
INTRAOCULAR | Status: DC | PRN
  Administered 2014-08-04: 15 mL via INTRAOCULAR

## 2014-08-04 MED ORDER — FENTANYL CITRATE 0.05 MG/ML IJ SOLN
25.0000 ug | INTRAMUSCULAR | Status: AC
  Administered 2014-08-04 (×2): 25 ug via INTRAVENOUS

## 2014-08-04 MED ORDER — TETRACAINE 0.5 % OP SOLN OPTIME - NO CHARGE
OPHTHALMIC | Status: DC | PRN
  Administered 2014-08-04: 1 [drp] via OPHTHALMIC

## 2014-08-04 MED ORDER — LIDOCAINE HCL (PF) 1 % IJ SOLN
INTRAMUSCULAR | Status: AC
  Filled 2014-08-04: qty 2

## 2014-08-04 MED ORDER — CYCLOPENTOLATE-PHENYLEPHRINE 0.2-1 % OP SOLN
1.0000 [drp] | OPHTHALMIC | Status: AC | PRN
  Administered 2014-08-04 (×3): 1 [drp] via OPHTHALMIC

## 2014-08-04 MED ORDER — LACTATED RINGERS IV SOLN
INTRAVENOUS | Status: DC
  Administered 2014-08-04: 1000 mL via INTRAVENOUS

## 2014-08-04 MED ORDER — PHENYLEPHRINE HCL 2.5 % OP SOLN
1.0000 [drp] | OPHTHALMIC | Status: AC | PRN
  Administered 2014-08-04 (×3): 1 [drp] via OPHTHALMIC

## 2014-08-04 MED ORDER — BSS IO SOLN
INTRAOCULAR | Status: DC | PRN
  Administered 2014-08-04: 500 mL

## 2014-08-04 MED ORDER — KETOROLAC TROMETHAMINE 0.5 % OP SOLN
1.0000 [drp] | OPHTHALMIC | Status: AC | PRN
  Administered 2014-08-04 (×3): 1 [drp] via OPHTHALMIC

## 2014-08-04 MED ORDER — MIDAZOLAM HCL 2 MG/2ML IJ SOLN
INTRAMUSCULAR | Status: AC
  Filled 2014-08-04: qty 2

## 2014-08-04 MED ORDER — TETRACAINE HCL 0.5 % OP SOLN
1.0000 [drp] | OPHTHALMIC | Status: AC | PRN
  Administered 2014-08-04 (×3): 1 [drp] via OPHTHALMIC

## 2014-08-04 SURGICAL SUPPLY — 25 items
CAPSULAR TENSION RING-AMO (OPHTHALMIC RELATED) IMPLANT
CLOTH BEACON ORANGE TIMEOUT ST (SAFETY) ×2 IMPLANT
EYE SHIELD UNIVERSAL CLEAR (GAUZE/BANDAGES/DRESSINGS) ×2 IMPLANT
GLOVE BIO SURGEON STRL SZ 6.5 (GLOVE) ×1 IMPLANT
GLOVE BIO SURGEONS STRL SZ 6.5 (GLOVE) ×1
GLOVE ECLIPSE 6.5 STRL STRAW (GLOVE) IMPLANT
GLOVE ECLIPSE 7.0 STRL STRAW (GLOVE) IMPLANT
GLOVE EXAM NITRILE LRG STRL (GLOVE) ×2 IMPLANT
GLOVE EXAM NITRILE MD LF STRL (GLOVE) IMPLANT
GLOVE SKINSENSE NS SZ6.5 (GLOVE)
GLOVE SKINSENSE STRL SZ6.5 (GLOVE) IMPLANT
HEALON 5 0.6 ML (INTRAOCULAR LENS) IMPLANT
KIT VITRECTOMY (OPHTHALMIC RELATED) IMPLANT
LENS ALC ACRYL/TECN (Ophthalmic Related) ×3 IMPLANT
PAD ARMBOARD 7.5X6 YLW CONV (MISCELLANEOUS) ×2 IMPLANT
PROC W NO LENS (INTRAOCULAR LENS)
PROC W SPEC LENS (INTRAOCULAR LENS)
PROCESS W NO LENS (INTRAOCULAR LENS) IMPLANT
PROCESS W SPEC LENS (INTRAOCULAR LENS) IMPLANT
RETRACTOR IRIS SIGHTPATH (OPHTHALMIC RELATED) IMPLANT
RING MALYGIN (MISCELLANEOUS) IMPLANT
TAPE SURG TRANSPORE 1 IN (GAUZE/BANDAGES/DRESSINGS) IMPLANT
TAPE SURGICAL TRANSPORE 1 IN (GAUZE/BANDAGES/DRESSINGS) ×2
VISCOELASTIC ADDITIONAL (OPHTHALMIC RELATED) IMPLANT
WATER STERILE IRR 250ML POUR (IV SOLUTION) ×2 IMPLANT

## 2014-08-04 NOTE — H&P (Signed)
The patient was re examined and there is no change in the patients condition since the original H and P. 

## 2014-08-04 NOTE — Transfer of Care (Signed)
Immediate Anesthesia Transfer of Care Note  Patient: Tracy Humphrey  Procedure(s) Performed: Procedure(s): CATARACT EXTRACTION PHACO AND INTRAOCULAR LENS PLACEMENT; CDE:  5.38 (Right)  Patient Location: Short Stay  Anesthesia Type:MAC  Level of Consciousness: awake, alert , oriented and patient cooperative  Airway & Oxygen Therapy: Patient Spontanous Breathing  Post-op Assessment: Report given to PACU RN, Post -op Vital signs reviewed and stable and Patient moving all extremities  Post vital signs: Reviewed and stable  Complications: No apparent anesthesia complications

## 2014-08-04 NOTE — Discharge Instructions (Signed)
Tracy Humphrey  08/04/2014           [facility] Instructions [address]- [place] [id number] [address]      1. Avoid closing eyes tightly. One often closes the eye tightly when laughing, talking, sneezing, coughing or if they feel irritated. At these times, you should be careful not to close your eyes tightly.  2. Instill eye drops as instructed. To instill drops in your eye, open it, look up and have someone gently pull the lower lid down and instill a couple of drops inside the lower lid.  3. Do not touch upper lid.  4. Take Advil or Tylenol for pain.  5. You may use either eye for near work, such as reading or sewing and you may watch television.  6. You may have your hair done at the beauty parlor at any time.  7. Wear dark glasses with or without your own glasses if you are in bright light.  8. Call our office at [phone] or [phone] if you have sharp pain in your eye or unusual symptoms.  9. Do not be concerned because vision in the operative eye is not good. It will not be good, no matter how successful the operation, until you get a special lens for it. Your old glasses will not be suited to the new eye that was operated on and you will not be ready for a new lens for about a month.  10. [facility].    I have received a copy of the above instructions and will follow them.

## 2014-08-04 NOTE — Anesthesia Preprocedure Evaluation (Signed)
Anesthesia Evaluation  Patient identified by MRN, date of birth, ID band Patient awake    Reviewed: Allergy & Precautions, NPO status , Patient's Chart, lab work & pertinent test results  Airway Mallampati: III  TM Distance: <3 FB     Dental  (+) Teeth Intact   Pulmonary neg pulmonary ROS,  breath sounds clear to auscultation        Cardiovascular negative cardio ROS  Rhythm:Regular Rate:Normal     Neuro/Psych    GI/Hepatic   Endo/Other    Renal/GU      Musculoskeletal   Abdominal   Peds  Hematology   Anesthesia Other Findings   Reproductive/Obstetrics                             Anesthesia Physical Anesthesia Plan  ASA: II  Anesthesia Plan: MAC   Post-op Pain Management:    Induction: Intravenous  Airway Management Planned: Nasal Cannula  Additional Equipment:   Intra-op Plan:   Post-operative Plan:   Informed Consent: I have reviewed the patients History and Physical, chart, labs and discussed the procedure including the risks, benefits and alternatives for the proposed anesthesia with the patient or authorized representative who has indicated his/her understanding and acceptance.     Plan Discussed with:   Anesthesia Plan Comments:         Anesthesia Quick Evaluation  

## 2014-08-04 NOTE — Op Note (Signed)
Patient brought to the operating room and prepped and draped in the usual manner.  Lid speculum inserted in right eye.  Stab incision made at the twelve o'clock position.  Provisc instilled in the anterior chamber.   A 2.4 mm. Stab incision was made temporally.  An anterior capsulotomy was done with a bent 25 gauge needle.  The nucleus was hydrodissected.  The Phaco tip was inserted in the anterior chamber and the nucleus was emulsified.  CDE was 5.38.  The cortical material was then removed with the I and A tip.  Posterior capsule was the polished.  The anterior chamber was deepened with Provisc.  A 22.0 Diopter Alcon SN60WF IOL was then inserted in the capsular bag.  Provisc was then removed with the I and A tip.  The wound was then hydrated.  Patient sent to the Recovery Room in good condition with follow up in my office.  Preoperative Diagnosis:  Nuclear Cataract OD Postoperative Diagnosis:  Same Procedure name: Kelman Phacoemulsification OD with IOL

## 2014-08-04 NOTE — Anesthesia Postprocedure Evaluation (Signed)
  Anesthesia Post-op Note  Patient: Tracy ConesJoan S Pelissier  Procedure(s) Performed: Procedure(s): CATARACT EXTRACTION PHACO AND INTRAOCULAR LENS PLACEMENT; CDE:  5.38 (Right)  Patient Location: Short Stay  Anesthesia Type:MAC  Level of Consciousness: awake, alert , oriented and patient cooperative  Airway and Oxygen Therapy: Patient Spontanous Breathing  Post-op Pain: none  Post-op Assessment: Post-op Vital signs reviewed, Patient's Cardiovascular Status Stable, Respiratory Function Stable, Patent Airway and Pain level not controlled  Post-op Vital Signs: Reviewed and stable  Last Vitals:  Filed Vitals:   08/04/14 0730  BP: 122/61  Temp:   Resp: 11    Complications: No apparent anesthesia complications

## 2014-08-05 ENCOUNTER — Encounter (HOSPITAL_COMMUNITY): Payer: Self-pay | Admitting: Ophthalmology

## 2014-09-14 ENCOUNTER — Encounter: Payer: Self-pay | Admitting: Family Medicine

## 2014-10-01 ENCOUNTER — Ambulatory Visit: Payer: 59 | Admitting: Family Medicine

## 2014-10-27 ENCOUNTER — Other Ambulatory Visit: Payer: Self-pay

## 2014-10-27 DIAGNOSIS — J309 Allergic rhinitis, unspecified: Secondary | ICD-10-CM

## 2014-10-27 MED ORDER — LORATADINE 10 MG PO TABS: 10.0000 mg | ORAL_TABLET | Freq: Every day | ORAL | Status: DC

## 2014-11-03 LAB — LIPID PANEL
Cholesterol: 197 mg/dL (ref 0–200)
HDL: 49 mg/dL (ref >=46)
LDL CALC: 114 mg/dL — AB (ref 0–99)
Total CHOL/HDL Ratio: 4 Ratio
Triglycerides: 169 mg/dL — ABNORMAL HIGH (ref <150)
VLDL: 34 mg/dL (ref 0–40)

## 2014-11-03 LAB — HEMOGLOBIN A1C
Hgb A1c MFr Bld: 6.6 % — ABNORMAL HIGH (ref <5.7)
MEAN PLASMA GLUCOSE: 143 mg/dL — AB (ref <117)

## 2014-11-05 ENCOUNTER — Ambulatory Visit (INDEPENDENT_AMBULATORY_CARE_PROVIDER_SITE_OTHER): Payer: PPO | Admitting: Family Medicine

## 2014-11-05 ENCOUNTER — Encounter: Payer: Self-pay | Admitting: Family Medicine

## 2014-11-05 VITALS — BP 124/80 | HR 86 | Resp 18 | Ht 60.0 in | Wt 177.0 lb

## 2014-11-05 DIAGNOSIS — R7303 Prediabetes: Secondary | ICD-10-CM

## 2014-11-05 DIAGNOSIS — Z0000 Encounter for general adult medical examination without abnormal findings: Secondary | ICD-10-CM | POA: Diagnosis not present

## 2014-11-05 DIAGNOSIS — E559 Vitamin D deficiency, unspecified: Secondary | ICD-10-CM

## 2014-11-05 DIAGNOSIS — Z1211 Encounter for screening for malignant neoplasm of colon: Secondary | ICD-10-CM

## 2014-11-05 DIAGNOSIS — J309 Allergic rhinitis, unspecified: Secondary | ICD-10-CM

## 2014-11-05 DIAGNOSIS — R7309 Other abnormal glucose: Secondary | ICD-10-CM

## 2014-11-05 DIAGNOSIS — Z13820 Encounter for screening for osteoporosis: Secondary | ICD-10-CM

## 2014-11-05 DIAGNOSIS — N3000 Acute cystitis without hematuria: Secondary | ICD-10-CM

## 2014-11-05 LAB — POCT URINALYSIS DIPSTICK
Bilirubin, UA: NEGATIVE
Blood, UA: NEGATIVE
GLUCOSE UA: NEGATIVE
KETONES UA: NEGATIVE
Nitrite, UA: NEGATIVE
UROBILINOGEN UA: 0.2
pH, UA: 5.5

## 2014-11-05 NOTE — Patient Instructions (Addendum)
Annual physical in 7 month  EKG for this visit within the next week  HBA1C, fasting lipid in 4 month  It is important that you exercise regularly at least 30 minutes 5 times a week. If you develop chest pain, have severe difficulty breathing, or feel very tired, stop exercising immediately and seek medical attention   You will be referred for a dexa  Trial of astelin nasal spray for allergies, not a steroid  You are referred for screening colonoscopy

## 2014-11-05 NOTE — Progress Notes (Signed)
Subjective:    Patient ID: Tracy Humphrey, female    DOB: [birth year], 64 y.o.   MRN: [id number]  HPI Preventive Screening-Counseling & Management   Patient present here today for a Medicare annual wellness visit.   Current Problems (verified)   Medications Prior to Visit Allergies (verified)   PAST HISTORY  Family History (updated)  Social History Semi retired mother of 1 married   Risk Factors  Current exercise habits:  Walks on treadmill and does yard work regularly ,  needs to increase  Amount and plans to do so  Dietary issues discussed:  Most meals cooked at home- avoids fried and fatty foods avoids sugars    Cardiac risk factors: metabolic syndrome , dyslipidemia, IGT, inc abdominal  girth  Depression Screen  (Note: if answer to either of the following is "Yes", a more complete depression screening is indicated)   Over the past two weeks, have you felt down, depressed or hopeless? No  Over the past two weeks, have you felt little interest or pleasure in doing things? No  Have you lost interest or pleasure in daily life? No  Do you often feel hopeless? No  Do you cry easily over simple problems? No   Activities of Daily Living  In your present state of health, do you have any difficulty performing the following activities?  Driving?: No Managing money?: No Feeding yourself?:No Getting from bed to chair?:No Climbing a flight of stairs?:No Preparing food and eating?:No Bathing or showering?:No Getting dressed?:No Getting to the toilet?:No Using the toilet?:No Moving around from place to place?: No  Fall Risk Assessment In the past year have you fallen or had a near fall?:No Are you currently taking any medications that make you dizzy?:No   Hearing Difficulties: No Do you often ask people to speak up or repeat themselves?:No Do you experience ringing or noises in your ears?:No Do you have difficulty understanding soft or whispered voices?:No  Cognitive  Testing  Alert? Yes Normal Appearance?Yes  Oriented to person? Yes Place? Yes  Time? Yes  Displays appropriate judgment?Yes  Can read the correct time from a watch face? yes Are you having problems remembering things?No  Advanced Directives have been discussed with the patient?Yes and patient has living will in place, full code   List the Names of Other Physician/Practitioners you currently use:  [facility],  Dr Janene [facility]   Indicate any recent Medical Services you may have received from other than [facility] providers in the past year (date may be approximate).   Assessment:    Welcome to medicare visit  Plan:     Medicare Attestation  I have personally reviewed:  The patient's medical and social history  Their use of alcohol, tobacco or illicit drugs  Their current medications and supplements  The patient's functional ability including ADLs,fall risks, home safety risks, cognitive, and hearing and visual impairment  Diet and physical activities  Evidence for depression or mood disorders  The patient's weight, height, BMI, and visual acuity have been recorded in the chart. I have made referrals, counseling, and provided education to the patient based on review of the above and I have provided the patient with a written personalized care plan for preventive services.      Review of Systems     Objective:   Physical Exam  BP 124/80 mmHg  Pulse 86  Resp 18  Ht 5' (1.524 m)  Wt 177 lb (80.287 kg)  BMI 34.57 kg/m2  SpO2 97%  Urinalysis: negative for blood or sugar EKG: NSR, no ischemia, no LVH     Assessment & Plan:

## 2014-11-07 LAB — URINE CULTURE: Colony Count: 45000

## 2014-11-08 ENCOUNTER — Encounter: Payer: Self-pay | Admitting: Family Medicine

## 2014-11-08 DIAGNOSIS — N3000 Acute cystitis without hematuria: Secondary | ICD-10-CM | POA: Insufficient documentation

## 2014-11-08 DIAGNOSIS — Z0000 Encounter for general adult medical examination without abnormal findings: Secondary | ICD-10-CM | POA: Insufficient documentation

## 2014-11-08 MED ORDER — AZELASTINE HCL 0.1 % NA SOLN: 2.0000 | Freq: Two times a day (BID) | NASAL | Status: DC

## 2014-11-08 NOTE — Assessment & Plan Note (Signed)
Screening urinalysis abn , will send for c/s, pt asymptomatic

## 2014-11-08 NOTE — Assessment & Plan Note (Addendum)
Annual exam as documented. Counseling done  re healthy lifestyle involving commitment to 150 minutes exercise per week, heart healthy diet, and attaining healthy weight.The importance of adequate sleep also discussed. Regular seat belt use and home safety, is also discussed. Changes in health habits are decided on by the patient with goals and time frames  set for achieving them. Immunization and cancer screening needs are specifically addressed at this visit. EKG obtained shows NSR , and no ischemia or LVH.

## 2014-11-10 ENCOUNTER — Encounter: Payer: Self-pay | Admitting: Family Medicine

## 2014-11-10 ENCOUNTER — Telehealth: Payer: Self-pay

## 2014-11-10 NOTE — Telephone Encounter (Signed)
Pt was referred by Dr. Lodema HongSimpson for screening colonoscopy. Her last one was with Dr. Jena Gaussourk in 02/2005. She is on recall for 02/2015.  [facility]

## 2014-11-12 ENCOUNTER — Encounter: Payer: Self-pay | Admitting: Family Medicine

## 2014-11-13 ENCOUNTER — Ambulatory Visit (HOSPITAL_COMMUNITY)
Admission: RE | Admit: 2014-11-13 | Discharge: 2014-11-13 | Disposition: A | Discharge status: Home / Self Care | Payer: PPO | Source: Ambulatory Visit | Attending: Family Medicine | Admitting: Family Medicine

## 2014-11-13 ENCOUNTER — Other Ambulatory Visit (HOSPITAL_COMMUNITY): Payer: PPO

## 2014-11-13 DIAGNOSIS — Z13820 Encounter for screening for osteoporosis: Secondary | ICD-10-CM | POA: Diagnosis present

## 2014-11-15 ENCOUNTER — Encounter: Payer: Self-pay | Admitting: Family Medicine

## 2014-11-15 NOTE — Progress Notes (Signed)
   Subjective:    Patient ID: Tracy Humphrey, female    DOB: 16-Jul-1950, 64 y.o.   MRN: [id number]  HPI    Review of Systems     Objective:   Physical Exam        Assessment & Plan:  Welcome to Medicare preventive visit Annual exam as documented. Counseling done  re healthy lifestyle involving commitment to 150 minutes exercise per week, heart healthy diet, and attaining healthy weight.The importance of adequate sleep also discussed. Regular seat belt use and home safety, is also discussed. Changes in health habits are decided on by the patient with goals and time frames  set for achieving them. Immunization and cancer screening needs are specifically addressed at this visit. EKG obtained shows NSR , and no ischemia or LVH.    Acute cystitis without hematuria Screening urinalysis abn , will send for c/s, pt asymptomatic

## 2014-11-18 NOTE — Addendum Note (Signed)
Addended by: Kandis FantasiaSLADE, Peyton Spengler B on: 11/18/2014 02:40 PM   Modules accepted: Orders

## 2014-11-19 NOTE — Telephone Encounter (Signed)
Letter mailed to pt to call.  

## 2014-11-23 NOTE — Telephone Encounter (Signed)
Pt left VM that she would like to wait and schedule the colonoscopy in the fall. She has it written down and will call us.

## 2014-12-03 ENCOUNTER — Encounter: Payer: Self-pay | Admitting: Orthopedic Surgery

## 2014-12-03 ENCOUNTER — Ambulatory Visit (INDEPENDENT_AMBULATORY_CARE_PROVIDER_SITE_OTHER): Payer: PPO | Admitting: Orthopedic Surgery

## 2014-12-03 VITALS — BP 148/86 | Ht 60.0 in | Wt 177.0 lb

## 2014-12-03 DIAGNOSIS — M7581 Other shoulder lesions, right shoulder: Secondary | ICD-10-CM

## 2014-12-03 DIAGNOSIS — M7582 Other shoulder lesions, left shoulder: Secondary | ICD-10-CM

## 2014-12-03 NOTE — Patient Instructions (Signed)
Rotator Cuff Tendinitis  Rotator cuff tendinitis is inflammation of the tough, cord-like bands that connect muscle to bone (tendons) in your rotator cuff. Your rotator cuff is the collection of all the muscles and tendons that connect your arm to your shoulder. Your rotator cuff holds the head of your upper arm bone (humerus) in the cup (fossa) of your shoulder blade (scapula). CAUSES Rotator cuff tendinitis is usually caused by overusing the joint involved.  SIGNS AND SYMPTOMS  Deep ache in the shoulder also felt on the outside upper arm over the shoulder muscle.  Point tenderness over the area that is injured.  Pain comes on gradually and becomes worse with lifting the arm to the side (abduction) or turning it inward (internal rotation).  May lead to a chronic tear: When a rotator cuff tendon becomes inflamed, it runs the risk of losing its blood supply, causing some tendon fibers to die. This increases the risk that the tendon can fray and partially or completely tear. DIAGNOSIS Rotator cuff tendinitis is diagnosed by taking a medical history, performing a physical exam, and reviewing results of imaging exams. The medical history is useful to help determine the type of rotator cuff injury. The physical exam will include looking at the injured shoulder, feeling the injured area, and watching you do range-of-motion exercises. X-ray exams are typically done to rule out other causes of shoulder pain, such as fractures. MRI is the imaging exam usually used for significant shoulder injuries. Sometimes a dye study called CT arthrogram is done, but it is not as widely used as MRI. In some institutions, special ultrasound tests may also be used to aid in the diagnosis. TREATMENT  Less Severe Cases  Use of a sling to rest the shoulder for a short period of time. Prolonged use of the sling can cause stiffness, weakness, and loss of motion of the shoulder joint.  Anti-inflammatory medicines, such as  ibuprofen or naproxen sodium, may be prescribed. More Severe Cases  Physical therapy.  Use of steroid injections into the shoulder joint.  Surgery. HOME CARE INSTRUCTIONS   Use a sling or splint until the pain decreases. Prolonged use of the sling can cause stiffness, weakness, and loss of motion of the shoulder joint.  Apply ice to the injured area:  Put ice in a plastic bag.  Place a towel between your skin and the bag.  Leave the ice on for 20 minutes, 2-3 times a day.  Try to avoid use other than gentle range of motion while your shoulder is painful. Use the shoulder and exercise only as directed by your health care provider. Stop exercises or range of motion if pain or discomfort increases, unless directed otherwise by your health care provider.  Only take over-the-counter or prescription medicines for pain, discomfort, or fever as directed by your health care provider.  If you were given a shoulder sling and straps (immobilizer), do not remove it except as directed, or until you see a health care provider for a follow-up exam. If you need to remove it, move your arm as little as possible or as directed.  You may want to sleep on several pillows at night to lessen swelling and pain. SEEK IMMEDIATE MEDICAL CARE IF:   Your shoulder pain increases or new pain develops in your arm, hand, or fingers and is not relieved with medicines.  You have new, unexplained symptoms, especially increased numbness in the hands or loss of strength.  You develop any worsening of the problems   that brought you in for care.  Your arm, hand, or fingers are numb or tingling.  Your arm, hand, or fingers are swollen, painful, or turn white or blue. MAKE SURE YOU:  Understand these instructions.  Will watch your condition.  Will get help right away if you are not doing well or get worse. Document Released: 09/16/2003 Document Revised: 04/16/2013 Document Reviewed: 02/05/2013 [employer] Patient  Information 2015 [employer]. This information is not intended to replace advice given to you by your health care provider. Make sure you discuss any questions you have with your health care provider.  

## 2014-12-03 NOTE — Progress Notes (Signed)
Patient ID: Tracy Humphrey, female   DOB: March 21, 1951, 64 y.o.   MRN: [id number]  Chief Complaint  Patient presents with  . Shoulder Problem    Bilateral shoulder pain 3 weeks    HPI Tracy Humphrey is a 64 y.o. female.  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 She's had pain 3-4 weeks. The pain is 7 out of 10 sharp stabbing aching and constant. However we do note that she has or seems to have increased symptomatology when she's kept her grandchild. The child is 54 months old and she's been taking care of her for 4-5 months. She has to pick her up often.  She is taking Tylenol arthritis Aleve and using a heating pad  Review of Systems Review of Systems 1. A comprehensive review of systems was negative except the patient has reported seasonal allergies and sinusitis  Specifically she denies numbness tingling arms or hands. Noticed chronic weakness in the right arm after a bout of shingles. She notes that she can't hold junk of TENS use her other arm to help but that is not an acute process.  Allergies  Allergen Reactions  . Prednisone     All steroids  . Penicillins     Current Outpatient Prescriptions  Medication Sig Dispense Refill  . aspirin (ASPIRIN LOW DOSE) 81 MG EC tablet Take 81 mg by mouth daily.      . Multiple Vitamin (MULTIVITAMIN PO) Take 1 tablet by mouth daily.     Marland Kitchen azelastine (ASTELIN) 0.1 % nasal spray Place 2 sprays into both nostrils 2 (two) times daily. Use in each nostril as directed 90 mL 12  . calcium carbonate (TUMS - DOSED IN MG ELEMENTAL CALCIUM) 500 MG chewable tablet Chew 2 tablets by mouth daily.      Marland Kitchen loratadine (CLARITIN) 10 MG tablet Take 1 tablet (10 mg total) by mouth daily. 90 tablet 3   No current facility-administered medications for this visit.      Physical Exam Physical Exam Blood pressure  148/86, height 5' (1.524 m), weight 177 lb (80.287 kg). BP 148/86 mmHg  Ht 5' (1.524 m)  Wt 177 lb (80.287 kg)  BMI 34.57 kg/m2  Gen. appearance normal grooming excellent hygiene The patient is alert and oriented person place and time  Exam of the right and left shoulder includes posterior examination with normal scapulothoracic rhythm full range of motion of the shoulders and no instability. Rotator cuff strength is normal in each shoulder. She has a mild impingement sign on the left and a negative impingement sign on the right. Her neck is nontender. Normal vascularity warmth and color are noted in both hands with good color and capillary refill.   Data Reviewed (0) No imaging studies were taken secondary to no history of trauma and clinical exam revealing no evidence of cuff tear  Assessment (3 new)    Encounter Diagnosis  Name Primary?  . Tendinitis of both rotator cuffs Yes   I think this is an activity related tendinitis which may be related to the care of the grandchild. She will pay particular attention to night when she has the child asked how she feels at the end of the evening and tomorrow     Plan: continue OTC aleve and tylenol (2)    We will communicate by my chart.  Fuller CanadaStanley Harrison 12/03/2014, 1:59 PM

## 2014-12-09 ENCOUNTER — Telehealth: Payer: Self-pay | Admitting: Orthopedic Surgery

## 2014-12-09 NOTE — Telephone Encounter (Signed)
Dr. Romeo AppleHarrison, Just for your information from Aurea GraffJoan, she called to let you know that after her visit her arm pain did subside but last evening she kept the grandbaby and her shoulders started back hurting again which she states she knows now the cause. She said she will continue Tylenol Arthritis and Aleve for pain.

## 2015-01-05 ENCOUNTER — Telehealth: Payer: Self-pay

## 2015-01-05 ENCOUNTER — Other Ambulatory Visit: Payer: Self-pay

## 2015-01-05 MED ORDER — PHENTERMINE HCL 37.5 MG PO TABS: 37.5000 mg | ORAL_TABLET | Freq: Every day | ORAL | Status: DC

## 2015-01-05 NOTE — Telephone Encounter (Signed)
pls send in #30 no refill, she will need to commit to 30 ins exercise every day and also to portion control and change food intake.  Weight loss goal of 2.5 to 3 pounds per month  Needs to re weight in 8 weeks, before refilled and should be getting labs soon also  DRINK water  PLEASE remind her of need to eat regulalrly even if the phenetermine makes her not want to eat

## 2015-01-05 NOTE — Telephone Encounter (Signed)
Patient aware and med sent  

## 2015-01-25 ENCOUNTER — Telehealth: Payer: Self-pay

## 2015-01-25 NOTE — Telephone Encounter (Signed)
PATIENT ON RECALL FOR TCS °

## 2015-01-26 NOTE — Telephone Encounter (Signed)
See note of 11/10/2014. Pt is aware and wants to wait til fall and she will call. I also have a reminder for me.

## 2015-03-04 ENCOUNTER — Other Ambulatory Visit: Payer: Self-pay

## 2015-03-04 MED ORDER — PHENTERMINE HCL 37.5 MG PO TABS: 37.5000 mg | ORAL_TABLET | Freq: Every day | ORAL | Status: DC

## 2015-04-02 ENCOUNTER — Other Ambulatory Visit: Payer: Self-pay | Admitting: Family Medicine

## 2015-04-02 DIAGNOSIS — Z1231 Encounter for screening mammogram for malignant neoplasm of breast: Secondary | ICD-10-CM

## 2015-05-13 ENCOUNTER — Ambulatory Visit (HOSPITAL_COMMUNITY): Payer: PPO

## 2015-05-27 ENCOUNTER — Ambulatory Visit (HOSPITAL_COMMUNITY)
Admission: RE | Admit: 2015-05-27 | Discharge: 2015-05-27 | Disposition: A | Discharge status: Home / Self Care | Payer: PPO | Source: Ambulatory Visit | Attending: Family Medicine | Admitting: Family Medicine

## 2015-05-27 DIAGNOSIS — Z1231 Encounter for screening mammogram for malignant neoplasm of breast: Secondary | ICD-10-CM | POA: Diagnosis present

## 2015-06-21 ENCOUNTER — Telehealth: Payer: Self-pay

## 2015-06-21 DIAGNOSIS — R7303 Prediabetes: Secondary | ICD-10-CM

## 2015-06-21 DIAGNOSIS — E559 Vitamin D deficiency, unspecified: Secondary | ICD-10-CM

## 2015-06-21 NOTE — Telephone Encounter (Signed)
Labs prior to next visit.

## 2015-07-13 ENCOUNTER — Other Ambulatory Visit: Payer: Self-pay

## 2015-07-13 DIAGNOSIS — J309 Allergic rhinitis, unspecified: Secondary | ICD-10-CM

## 2015-07-13 MED ORDER — AZELASTINE HCL 0.1 % NA SOLN: 2.0000 | Freq: Two times a day (BID) | NASAL | Status: DC

## 2015-07-13 MED ORDER — TAB-A-VITE PO TABS: ORAL_TABLET | ORAL | Status: DC

## 2015-07-13 MED ORDER — LORATADINE 10 MG PO TABS: 10.0000 mg | ORAL_TABLET | Freq: Every day | ORAL | Status: DC

## 2015-07-20 MED FILL — AZELASTINE HCL 137 MCG SPRY: 0.1 | 90 days supply | Qty: 90 | Fill #0

## 2015-10-26 ENCOUNTER — Other Ambulatory Visit: Payer: Self-pay

## 2015-10-26 ENCOUNTER — Telehealth: Payer: Self-pay

## 2015-10-26 ENCOUNTER — Encounter: Payer: Self-pay | Admitting: Family Medicine

## 2015-10-26 DIAGNOSIS — J309 Allergic rhinitis, unspecified: Secondary | ICD-10-CM

## 2015-10-26 DIAGNOSIS — R7303 Prediabetes: Secondary | ICD-10-CM

## 2015-10-26 DIAGNOSIS — E8881 Metabolic syndrome: Secondary | ICD-10-CM

## 2015-10-26 DIAGNOSIS — E559 Vitamin D deficiency, unspecified: Secondary | ICD-10-CM

## 2015-10-26 DIAGNOSIS — E785 Hyperlipidemia, unspecified: Secondary | ICD-10-CM

## 2015-10-26 MED ORDER — LORATADINE 10 MG PO TABS: 10.0000 mg | ORAL_TABLET | Freq: Every day | ORAL | Status: DC

## 2015-10-26 MED ORDER — TAB-A-VITE PO TABS: ORAL_TABLET | ORAL | Status: AC

## 2015-10-26 NOTE — Telephone Encounter (Signed)
Labs ordered.

## 2015-11-09 ENCOUNTER — Telehealth: Payer: Self-pay | Admitting: Family Medicine

## 2015-11-09 NOTE — Telephone Encounter (Signed)
Pls call pt and request that she schedule appt for follow up, she needs o have labs at least 5 days prior to her appt also Pls let her know that I have asked you to call her as I am concerned, so hope that she comes! Also needs colonscopy , so gene health needs to be updated  May be wellness visit as her last was in 10/2014, pls accommodate as best able and try to get her to commit to May appt as able, thanks!

## 2015-11-10 DIAGNOSIS — E8881 Metabolic syndrome: Secondary | ICD-10-CM | POA: Diagnosis not present

## 2015-11-10 DIAGNOSIS — R7303 Prediabetes: Secondary | ICD-10-CM | POA: Diagnosis not present

## 2015-11-10 DIAGNOSIS — R7309 Other abnormal glucose: Secondary | ICD-10-CM | POA: Diagnosis not present

## 2015-11-10 DIAGNOSIS — E785 Hyperlipidemia, unspecified: Secondary | ICD-10-CM | POA: Diagnosis not present

## 2015-11-10 DIAGNOSIS — E559 Vitamin D deficiency, unspecified: Secondary | ICD-10-CM | POA: Diagnosis not present

## 2015-11-10 LAB — CBC WITH DIFFERENTIAL/PLATELET
BASOS ABS: 0 {cells}/uL (ref 0–200)
BASOS PCT: 0 %
Eosinophils Absolute: 180 cells/uL (ref 15–500)
Eosinophils Relative: 2 %
HCT: 41.1 % (ref 35.0–45.0)
HEMOGLOBIN: 14 g/dL (ref 11.7–15.5)
LYMPHS ABS: 2790 {cells}/uL (ref 850–3900)
Lymphocytes Relative: 31 %
MCH: 29.5 pg (ref 27.0–33.0)
MCHC: 34.1 g/dL (ref 32.0–36.0)
MCV: 86.5 fL (ref 80.0–100.0)
MONOS PCT: 7 %
MPV: 11.4 fL (ref 7.5–12.5)
Monocytes Absolute: 630 cells/uL (ref 200–950)
NEUTROS ABS: 5400 {cells}/uL (ref 1500–7800)
Neutrophils Relative %: 60 %
PLATELETS: 182 10*3/uL (ref 140–400)
RBC: 4.75 MIL/uL (ref 3.80–5.10)
RDW: 13.8 % (ref 11.0–15.0)
WBC: 9 10*3/uL (ref 3.8–10.8)

## 2015-11-10 LAB — HEMOGLOBIN A1C
HEMOGLOBIN A1C: 6.5 % — AB (ref <5.7)
Mean Plasma Glucose: 140 mg/dL

## 2015-11-11 LAB — COMPLETE METABOLIC PANEL WITH GFR
ALK PHOS: 80 U/L (ref 33–130)
ALT: 22 U/L (ref 6–29)
AST: 20 U/L (ref 10–35)
Albumin: 4.2 g/dL (ref 3.6–5.1)
BILIRUBIN TOTAL: 0.8 mg/dL (ref 0.2–1.2)
BUN: 21 mg/dL (ref 7–25)
CALCIUM: 9.4 mg/dL (ref 8.6–10.4)
CHLORIDE: 101 mmol/L (ref 98–110)
CO2: 29 mmol/L (ref 20–31)
Creat: 0.65 mg/dL (ref 0.50–0.99)
GFR, Est African American: >89 mL/min (ref >=60)
Glucose, Bld: 119 mg/dL — ABNORMAL HIGH (ref 65–99)
POTASSIUM: 4.1 mmol/L (ref 3.5–5.3)
Sodium: 138 mmol/L (ref 135–146)
Total Protein: 6.8 g/dL (ref 6.1–8.1)

## 2015-11-11 LAB — LIPID PANEL
CHOL/HDL RATIO: 4.1 ratio (ref <=5.0)
Cholesterol: 207 mg/dL — ABNORMAL HIGH (ref 125–200)
HDL: 51 mg/dL (ref >=46)
LDL Cholesterol: 120 mg/dL (ref <130)
Triglycerides: 182 mg/dL — ABNORMAL HIGH (ref <150)
VLDL: 36 mg/dL — AB (ref <30)

## 2015-11-11 LAB — VITAMIN D 25 HYDROXY (VIT D DEFICIENCY, FRACTURES): Vit D, 25-Hydroxy: 37 ng/mL (ref 30–100)

## 2015-11-11 LAB — TSH: TSH: 3.6 mIU/L

## 2015-11-11 NOTE — Telephone Encounter (Signed)
Tracy GraffJoan is scheduled for May 10th at 9:45 and she had labs drawn Wednesday May 3rd

## 2015-11-14 ENCOUNTER — Encounter: Payer: Self-pay | Admitting: Family Medicine

## 2015-11-15 ENCOUNTER — Other Ambulatory Visit: Payer: Self-pay

## 2015-11-17 ENCOUNTER — Ambulatory Visit (INDEPENDENT_AMBULATORY_CARE_PROVIDER_SITE_OTHER): Payer: PPO | Admitting: Family Medicine

## 2015-11-17 ENCOUNTER — Encounter: Payer: Self-pay | Admitting: Family Medicine

## 2015-11-17 VITALS — BP 130/80 | HR 94 | Resp 16 | Ht 60.0 in | Wt 180.0 lb

## 2015-11-17 DIAGNOSIS — R7303 Prediabetes: Secondary | ICD-10-CM

## 2015-11-17 DIAGNOSIS — Z0000 Encounter for general adult medical examination without abnormal findings: Secondary | ICD-10-CM

## 2015-11-17 DIAGNOSIS — Z23 Encounter for immunization: Secondary | ICD-10-CM

## 2015-11-17 DIAGNOSIS — E785 Hyperlipidemia, unspecified: Secondary | ICD-10-CM

## 2015-11-17 NOTE — Progress Notes (Signed)
Subjective:    Patient ID: Jacki ConesJoan S Memmott, female    DOB: May 18, 1951, 65 y.o.   MRN: [id number]  HPI Preventive Screening-Counseling & Management   Patient present here today for a Medicare annual wellness visit.   Current Problems (verified)   Medications Prior to Visit Allergies (verified)   PAST HISTORY  Family History (updated)  Social History Married for 49 years; 1 adult son and 1 grandchild; recently retired from health management    Risk Factors  Current exercise habits:  Walks daily on treadmill or outside and strengthening exercises   Dietary issues discussed:  Heart healthy low diet; carb conscious    Cardiac risk factors:   Depression Screen  (Note: if answer to either of the following is "Yes", a more complete depression screening is indicated)   Over the past two weeks, have you felt down, depressed or hopeless? No  Over the past two weeks, have you felt little interest or pleasure in doing things? No  Have you lost interest or pleasure in daily life? No  Do you often feel hopeless? No  Do you cry easily over simple problems? No   Activities of Daily Living  In your present state of health, do you have any difficulty performing the following activities?  Driving?: No Managing money?: No Feeding yourself?:No Getting from bed to chair?:No Climbing a flight of stairs?:No Preparing food and eating?:No Bathing or showering?:No Getting dressed?:No Getting to the toilet?:No Using the toilet?:No Moving around from place to place?: No  Fall Risk Assessment In the past year have you fallen or had a near fall?:No Are you currently taking any medications that make you dizzy?:No   Hearing Difficulties: No Do you often ask people to speak up or repeat themselves?:No Do you experience ringing or noises in your ears?:No Do you have difficulty understanding soft or whispered voices?:No  Cognitive Testing  Alert? Yes Normal Appearance?Yes  Oriented to  person? Yes Place? Yes  Time? Yes  Displays appropriate judgment?Yes  Can read the correct time from a watch face? yes Are you having problems remembering things?No age appropriate   Advanced Directives have been discussed with the patient?Yes and currently has living will   List the Names of Other Physician/Practitioners you currently use: care teams list up to date    Indicate any recent Medical Services you may have received from other than [facility] providers in the past year (date may be approximate).   Assessment:    Annual Wellness Exam   Plan:    During the course of the visit the patient was educated and counseled about appropriate screening and preventive services including:  A healthy diet is rich in fruit, vegetables and whole grains. Poultry fish, nuts and beans are a healthy choice for protein rather then red meat. A low sodium diet and drinking 64 ounces of water daily is generally recommended. Oils and sweet should be limited. Carbohydrates especially for those who are diabetic or overweight, should be limited to 30-45 gram per meal. It is important to eat on a regular schedule, at least 3 times daily. Snacks should be primarily fruits, vegetables or nuts. It is important that you exercise regularly at least 30 minutes 5 times a week. If you develop chest pain, have severe difficulty breathing, or feel very tired, stop exercising immediately and seek medical attention  Immunization reviewed and updated. Cancer screening reviewed and updated    Patient Instructions (the written plan) was given to the patient.  Medicare Attestation  I have personally reviewed:  The patient's medical and social history  Their use of alcohol, tobacco or illicit drugs  Their current medications and supplements  The patient's functional ability including ADLs,fall risks, home safety risks, cognitive, and hearing and visual impairment  Diet and physical activities  Evidence for depression or  mood disorders  The patient's weight, height, BMI, and visual acuity have been recorded in the chart. I have made referrals, counseling, and provided education to the patient based on review of the above and I have provided the patient with a written personalized care plan for preventive services.      Review of Systems     Objective:   Physical Exam BP 130/80 mmHg  Pulse 94  Resp 16  Ht 5' (1.524 m)  Wt 180 lb (81.647 kg)  BMI 35.15 kg/m2  SpO2 96%       Assessment & Plan:  Medicare annual wellness visit, subsequent Annual exam as documented. Counseling done  re healthy lifestyle involving commitment to 150 minutes exercise per week, heart healthy diet, and attaining healthy weight.The importance of adequate sleep also discussed. Regular seat belt use and home safety, is also discussed. Changes in health habits are decided on by the patient with goals and time frames  set for achieving them. Immunization and cancer screening needs are specifically addressed at this visit.

## 2015-11-17 NOTE — Patient Instructions (Signed)
F/u in 6 month, call if you need me before  HBa1C, fasting lipid in 5 months and 3 weeks  Need colonoscopy this year  Mammogram due in 05/2016  Pneumonia 23 today  Fall Prevention in the Home  Falls can cause injuries. They can happen to people of all ages. There are many things you can do to make your home safe and to help prevent falls.  WHAT CAN I DO ON THE OUTSIDE OF MY HOME?  Regularly fix the edges of walkways and driveways and fix any cracks.  Remove anything that might make you trip as you walk through a door, such as a raised step or threshold.  Trim any bushes or trees on the path to your home.  Use bright outdoor lighting.  Clear any walking paths of anything that might make someone trip, such as rocks or tools.  Regularly check to see if handrails are loose or broken. Make sure that both sides of any steps have handrails.  Any raised decks and porches should have guardrails on the edges.  Have any leaves, snow, or ice cleared regularly.  Use sand or salt on walking paths during winter.  Clean up any spills in your garage right away. This includes oil or grease spills. WHAT CAN I DO IN THE BATHROOM?   Use night lights.  Install grab bars by the toilet and in the tub and shower. Do not use towel bars as grab bars.  Use non-skid mats or decals in the tub or shower.  If you need to sit down in the shower, use a plastic, non-slip stool.  Keep the floor dry. Clean up any water that spills on the floor as soon as it happens.  Remove soap buildup in the tub or shower regularly.  Attach bath mats securely with double-sided non-slip rug tape.  Do not have throw rugs and other things on the floor that can make you trip. WHAT CAN I DO IN THE BEDROOM?  Use night lights.  Make sure that you have a light by your bed that is easy to reach.  Do not use any sheets or blankets that are too big for your bed. They should not hang down onto the floor.  Have a firm  chair that has side arms. You can use this for support while you get dressed.  Do not have throw rugs and other things on the floor that can make you trip. WHAT CAN I DO IN THE KITCHEN?  Clean up any spills right away.  Avoid walking on wet floors.  Keep items that you use a lot in easy-to-reach places.  If you need to reach something above you, use a strong step stool that has a grab bar.  Keep electrical cords out of the way.  Do not use floor polish or wax that makes floors slippery. If you must use wax, use non-skid floor wax.  Do not have throw rugs and other things on the floor that can make you trip. WHAT CAN I DO WITH MY STAIRS?  Do not leave any items on the stairs.  Make sure that there are handrails on both sides of the stairs and use them. Fix handrails that are broken or loose. Make sure that handrails are as long as the stairways.  Check any carpeting to make sure that it is firmly attached to the stairs. Fix any carpet that is loose or worn.  Avoid having throw rugs at the top or bottom of the stairs.  If you do have throw rugs, attach them to the floor with carpet tape.  Make sure that you have a light switch at the top of the stairs and the bottom of the stairs. If you do not have them, ask someone to add them for you. WHAT ELSE CAN I DO TO HELP PREVENT FALLS?  Wear shoes that:  Do not have high heels.  Have rubber bottoms.  Are comfortable and fit you well.  Are closed at the toe. Do not wear sandals.  If you use a stepladder:  Make sure that it is fully opened. Do not climb a closed stepladder.  Make sure that both sides of the stepladder are locked into place.  Ask someone to hold it for you, if possible.  Clearly mark and make sure that you can see:  Any grab bars or handrails.  First and last steps.  Where the edge of each step is.  Use tools that help you move around (mobility aids) if they are needed. These  include:  Canes.  Walkers.  Scooters.  Crutches.  Turn on the lights when you go into a dark area. Replace any light bulbs as soon as they burn out.  Set up your furniture so you have a clear path. Avoid moving your furniture around.  If any of your floors are uneven, fix them.  If there are any pets around you, be aware of where they are.  Review your medicines with your doctor. Some medicines can make you feel dizzy. This can increase your chance of falling. Ask your doctor what other things that you can do to help prevent falls.   This information is not intended to replace advice given to you by your health care provider. Make sure you discuss any questions you have with your health care provider.   Document Released: 04/22/2009 Document Revised: 11/10/2014 Document Reviewed: 07/31/2014 Elsevier Interactive Patient Education [employer].

## 2015-11-17 NOTE — Progress Notes (Signed)
   Subjective:    Patient ID: Tracy Humphrey, female    DOB: August 31, 1950, 65 y.o.   MRN: [id number]  HPI  Visit cancelled and change to a wellness Assessment:      Plan:        Review of Systems     Objective:   Physical Exam        Assessment & Plan:

## 2015-11-18 DIAGNOSIS — Z0000 Encounter for general adult medical examination without abnormal findings: Secondary | ICD-10-CM | POA: Insufficient documentation

## 2015-11-18 DIAGNOSIS — Z09 Encounter for follow-up examination after completed treatment for conditions other than malignant neoplasm: Secondary | ICD-10-CM | POA: Insufficient documentation

## 2015-11-18 NOTE — Assessment & Plan Note (Signed)

## 2016-01-31 ENCOUNTER — Encounter: Payer: Self-pay | Admitting: Family Medicine

## 2016-02-01 ENCOUNTER — Other Ambulatory Visit: Payer: Self-pay

## 2016-02-01 DIAGNOSIS — J309 Allergic rhinitis, unspecified: Secondary | ICD-10-CM

## 2016-02-01 MED ORDER — LORATADINE 10 MG PO TABS: 10.0000 mg | ORAL_TABLET | Freq: Every day | ORAL | 3 refills | Status: DC

## 2016-02-03 ENCOUNTER — Other Ambulatory Visit: Payer: Self-pay

## 2016-02-03 DIAGNOSIS — J309 Allergic rhinitis, unspecified: Secondary | ICD-10-CM

## 2016-02-03 MED ORDER — LORATADINE 10 MG PO TABS: 10.0000 mg | ORAL_TABLET | Freq: Every day | ORAL | 3 refills | Status: AC

## 2016-02-03 NOTE — Telephone Encounter (Signed)
Will you resend the loratadine prescription to the pharmacy. Tracy Humphrey has called twice and they keep saying they didn't receive it - even though it says in the chart it was sent.

## 2016-04-18 ENCOUNTER — Encounter: Payer: Self-pay | Admitting: Family Medicine

## 2016-04-24 ENCOUNTER — Other Ambulatory Visit (HOSPITAL_COMMUNITY): Payer: Self-pay | Admitting: Gynecology

## 2016-04-24 ENCOUNTER — Ambulatory Visit (INDEPENDENT_AMBULATORY_CARE_PROVIDER_SITE_OTHER): Payer: PPO

## 2016-04-24 DIAGNOSIS — Z23 Encounter for immunization: Secondary | ICD-10-CM

## 2016-04-24 DIAGNOSIS — Z1231 Encounter for screening mammogram for malignant neoplasm of breast: Secondary | ICD-10-CM

## 2016-04-26 ENCOUNTER — Other Ambulatory Visit: Payer: Self-pay | Admitting: Family Medicine

## 2016-04-26 DIAGNOSIS — E785 Hyperlipidemia, unspecified: Secondary | ICD-10-CM | POA: Diagnosis not present

## 2016-04-26 DIAGNOSIS — Z7289 Other problems related to lifestyle: Secondary | ICD-10-CM | POA: Diagnosis not present

## 2016-04-26 DIAGNOSIS — R7309 Other abnormal glucose: Secondary | ICD-10-CM | POA: Diagnosis not present

## 2016-04-26 DIAGNOSIS — R7303 Prediabetes: Secondary | ICD-10-CM | POA: Diagnosis not present

## 2016-04-27 ENCOUNTER — Encounter: Payer: Self-pay | Admitting: Family Medicine

## 2016-04-27 LAB — LIPID PANEL
Cholesterol: 183 mg/dL (ref 125–200)
HDL: 46 mg/dL (ref >=46)
LDL Cholesterol: 103 mg/dL (ref <130)
TRIGLYCERIDES: 169 mg/dL — AB (ref <150)
Total CHOL/HDL Ratio: 4 Ratio (ref <=5.0)
VLDL: 34 mg/dL — ABNORMAL HIGH (ref <30)

## 2016-04-27 LAB — HEMOGLOBIN A1C
HEMOGLOBIN A1C: 6 % — AB (ref <5.7)
MEAN PLASMA GLUCOSE: 126 mg/dL

## 2016-04-27 LAB — HEPATITIS C ANTIBODY: HCV Ab: NEGATIVE

## 2016-06-05 ENCOUNTER — Ambulatory Visit (HOSPITAL_COMMUNITY)
Admission: RE | Admit: 2016-06-05 | Discharge: 2016-06-05 | Disposition: A | Discharge status: Home / Self Care | Payer: PPO | Source: Ambulatory Visit | Attending: Gynecology | Admitting: Gynecology

## 2016-06-05 DIAGNOSIS — Z1231 Encounter for screening mammogram for malignant neoplasm of breast: Secondary | ICD-10-CM | POA: Insufficient documentation

## 2016-08-16 DIAGNOSIS — Z961 Presence of intraocular lens: Secondary | ICD-10-CM | POA: Diagnosis not present

## 2016-08-16 LAB — HM DIABETES EYE EXAM

## 2016-08-30 ENCOUNTER — Encounter: Payer: Self-pay | Admitting: Family Medicine

## 2016-08-31 ENCOUNTER — Telehealth: Payer: Self-pay

## 2016-08-31 DIAGNOSIS — E785 Hyperlipidemia, unspecified: Secondary | ICD-10-CM

## 2016-08-31 DIAGNOSIS — E8881 Metabolic syndrome: Secondary | ICD-10-CM

## 2016-08-31 DIAGNOSIS — R7303 Prediabetes: Secondary | ICD-10-CM

## 2016-08-31 NOTE — Telephone Encounter (Signed)
-----  Message from Fayrene Helper, MD sent at 08/30/2016 10:36 PM EST ----- Regarding: lab order to be mailed pls Needs CBC,  Fasting lipid, cmp and EGFr and hBa1C May 5 or after pls mail this to her

## 2016-10-18 ENCOUNTER — Encounter: Payer: Self-pay | Admitting: Family Medicine

## 2016-10-19 ENCOUNTER — Other Ambulatory Visit: Payer: Self-pay | Admitting: Family Medicine

## 2016-10-19 ENCOUNTER — Telehealth: Payer: Self-pay

## 2016-10-19 DIAGNOSIS — G8929 Other chronic pain: Secondary | ICD-10-CM

## 2016-10-19 DIAGNOSIS — M25511 Pain in right shoulder: Principal | ICD-10-CM

## 2016-10-19 NOTE — Telephone Encounter (Signed)
error 

## 2016-10-20 ENCOUNTER — Other Ambulatory Visit: Payer: Self-pay | Admitting: Family Medicine

## 2016-10-20 ENCOUNTER — Telehealth: Payer: Self-pay | Admitting: Family Medicine

## 2016-10-20 DIAGNOSIS — G8929 Other chronic pain: Secondary | ICD-10-CM

## 2016-10-20 DIAGNOSIS — M25512 Pain in left shoulder: Principal | ICD-10-CM

## 2016-10-20 NOTE — Telephone Encounter (Signed)
pls cancel order for x ray of right shoulder I entered yesterday, new msg clarifies she wants the left shoulder x rayed, I have tried to cancel, unable, may have to call dept and give a verbal, if able pls send stamped D/C order. pls let me know if you are unable to do this, call office pager number

## 2016-10-20 NOTE — Addendum Note (Signed)
Addended by: Abner Greenspan on: 10/20/2016 09:17 AM   Modules accepted: Orders

## 2016-10-20 NOTE — Telephone Encounter (Signed)
Thanks, will have to learn from you how to cancel

## 2016-10-20 NOTE — Telephone Encounter (Signed)
I cancelled the right arm in epic and faxed a d/c order for the right arm

## 2016-11-06 DIAGNOSIS — M25512 Pain in left shoulder: Secondary | ICD-10-CM | POA: Diagnosis not present

## 2016-11-06 DIAGNOSIS — M67912 Unspecified disorder of synovium and tendon, left shoulder: Secondary | ICD-10-CM | POA: Diagnosis not present

## 2016-11-20 DIAGNOSIS — M25512 Pain in left shoulder: Secondary | ICD-10-CM | POA: Diagnosis not present

## 2016-11-23 ENCOUNTER — Other Ambulatory Visit: Payer: Self-pay | Admitting: Orthopedic Surgery

## 2016-11-23 DIAGNOSIS — M85622 Other cyst of bone, left upper arm: Secondary | ICD-10-CM

## 2016-11-25 ENCOUNTER — Encounter: Payer: Self-pay | Admitting: Family Medicine

## 2016-11-28 DIAGNOSIS — E785 Hyperlipidemia, unspecified: Secondary | ICD-10-CM | POA: Diagnosis not present

## 2016-11-28 DIAGNOSIS — R7303 Prediabetes: Secondary | ICD-10-CM | POA: Diagnosis not present

## 2016-11-28 DIAGNOSIS — E8881 Metabolic syndrome: Secondary | ICD-10-CM | POA: Diagnosis not present

## 2016-11-28 LAB — LIPID PANEL
CHOLESTEROL: 200 mg/dL — AB (ref <200)
HDL: 54 mg/dL (ref >50)
LDL Cholesterol: 122 mg/dL — ABNORMAL HIGH (ref <100)
TRIGLYCERIDES: 122 mg/dL (ref <150)
Total CHOL/HDL Ratio: 3.7 Ratio (ref <5.0)
VLDL: 24 mg/dL (ref <30)

## 2016-11-28 LAB — COMPLETE METABOLIC PANEL WITH GFR
ALT: 19 U/L (ref 6–29)
AST: 20 U/L (ref 10–35)
Albumin: 4.1 g/dL (ref 3.6–5.1)
Alkaline Phosphatase: 82 U/L (ref 33–130)
BILIRUBIN TOTAL: 0.8 mg/dL (ref 0.2–1.2)
BUN: 17 mg/dL (ref 7–25)
CALCIUM: 9.2 mg/dL (ref 8.6–10.4)
CHLORIDE: 104 mmol/L (ref 98–110)
CO2: 27 mmol/L (ref 20–31)
CREATININE: 0.63 mg/dL (ref 0.50–0.99)
GFR, Est Non African American: >89 mL/min (ref >=60)
Glucose, Bld: 122 mg/dL — ABNORMAL HIGH (ref 65–99)
Potassium: 4.4 mmol/L (ref 3.5–5.3)
Sodium: 141 mmol/L (ref 135–146)
TOTAL PROTEIN: 6.8 g/dL (ref 6.1–8.1)

## 2016-11-28 LAB — CBC
HCT: 41.8 % (ref 35.0–45.0)
Hemoglobin: 14.2 g/dL (ref 11.7–15.5)
MCH: 29.9 pg (ref 27.0–33.0)
MCHC: 34 g/dL (ref 32.0–36.0)
MCV: 88 fL (ref 80.0–100.0)
MPV: 11.4 fL (ref 7.5–12.5)
Platelets: 171 10*3/uL (ref 140–400)
RBC: 4.75 MIL/uL (ref 3.80–5.10)
RDW: 13.3 % (ref 11.0–15.0)
WBC: 8.2 10*3/uL (ref 3.8–10.8)

## 2016-11-29 ENCOUNTER — Encounter: Payer: Self-pay | Admitting: Family Medicine

## 2016-11-29 LAB — HEMOGLOBIN A1C
Hgb A1c MFr Bld: 6.5 % — ABNORMAL HIGH (ref <5.7)
MEAN PLASMA GLUCOSE: 140 mg/dL

## 2016-12-08 ENCOUNTER — Ambulatory Visit
Admission: RE | Admit: 2016-12-08 | Discharge: 2016-12-08 | Disposition: A | Discharge status: Home / Self Care | Payer: PPO | Source: Ambulatory Visit | Attending: Orthopedic Surgery | Admitting: Orthopedic Surgery

## 2016-12-08 DIAGNOSIS — M85622 Other cyst of bone, left upper arm: Secondary | ICD-10-CM

## 2016-12-08 DIAGNOSIS — S76111A Strain of right quadriceps muscle, fascia and tendon, initial encounter: Secondary | ICD-10-CM | POA: Diagnosis not present

## 2016-12-08 MED ORDER — GADOBENATE DIMEGLUMINE 529 MG/ML IV SOLN
17.0000 mL | Freq: Once | INTRAVENOUS | Status: AC | PRN
  Administered 2016-12-08: 17 mL via INTRAVENOUS

## 2016-12-11 ENCOUNTER — Encounter: Payer: Self-pay | Admitting: Family Medicine

## 2016-12-11 ENCOUNTER — Ambulatory Visit (INDEPENDENT_AMBULATORY_CARE_PROVIDER_SITE_OTHER): Payer: PPO | Admitting: Family Medicine

## 2016-12-11 VITALS — BP 124/80 | HR 101 | Resp 16 | Ht 60.0 in | Wt 172.0 lb

## 2016-12-11 DIAGNOSIS — R42 Dizziness and giddiness: Secondary | ICD-10-CM

## 2016-12-11 DIAGNOSIS — E785 Hyperlipidemia, unspecified: Secondary | ICD-10-CM

## 2016-12-11 DIAGNOSIS — E8881 Metabolic syndrome: Secondary | ICD-10-CM

## 2016-12-11 DIAGNOSIS — E669 Obesity, unspecified: Secondary | ICD-10-CM

## 2016-12-11 DIAGNOSIS — R7303 Prediabetes: Secondary | ICD-10-CM

## 2016-12-11 DIAGNOSIS — E119 Type 2 diabetes mellitus without complications: Secondary | ICD-10-CM | POA: Diagnosis not present

## 2016-12-11 DIAGNOSIS — Z1211 Encounter for screening for malignant neoplasm of colon: Secondary | ICD-10-CM

## 2016-12-11 DIAGNOSIS — J3089 Other allergic rhinitis: Secondary | ICD-10-CM

## 2016-12-11 DIAGNOSIS — E66811 Obesity, class 1: Secondary | ICD-10-CM

## 2016-12-11 NOTE — Patient Instructions (Addendum)
F/u in January, call if you need me before  Wellness per your schedule with nurse   , and fasting lipid cmp and eGFR and hBa1c nand tSH in 5 months  Congrats on weight loss ,  Keep exercise and change in diet commitment up, it will make you successful!  .Please schedule your colonoscopy  We will send for your feb eye exam  Mammogram is due in November  Thank you  for choosing [facility]. We consider it a privelige to serve you.  Delivering excellent health care in a caring and  compassionate way is our goal.  Partnering with you,  so that together we can achieve this goal is our strategy.

## 2016-12-12 ENCOUNTER — Other Ambulatory Visit (HOSPITAL_COMMUNITY)
Admission: RE | Admit: 2016-12-12 | Discharge: 2016-12-12 | Disposition: A | Discharge status: Home / Self Care | Payer: PPO | Source: Other Acute Inpatient Hospital | Attending: Family Medicine | Admitting: Family Medicine

## 2016-12-12 ENCOUNTER — Encounter: Payer: Self-pay | Admitting: Family Medicine

## 2016-12-12 DIAGNOSIS — E119 Type 2 diabetes mellitus without complications: Secondary | ICD-10-CM | POA: Insufficient documentation

## 2016-12-12 NOTE — Assessment & Plan Note (Signed)
Deteriorated Hyperlipidemia:Low fat diet discussed and encouraged.   Lipid Panel  Lab Results  Component Value Date   CHOL 200 (H) 11/28/2016   HDL 54 11/28/2016   LDLCALC 122 (H) 11/28/2016   TRIG 122 11/28/2016   CHOLHDL 3.7 11/28/2016   Updated lab needed at/ before next visit.

## 2016-12-12 NOTE — Assessment & Plan Note (Signed)
Controlled, no change in medication  

## 2016-12-12 NOTE — Assessment & Plan Note (Signed)
Improved Patient re-educated about  the importance of commitment to a  minimum of 150 minutes of exercise per week.  The importance of healthy food choices with portion control discussed. Encouraged to start a food diary, count calories and to consider  joining a support group. Sample diet sheets offered. Goals set by the patient for the next several months.   Weight /BMI 12/11/2016 11/17/2015 11/17/2015  WEIGHT 172 lb 180 lb 180 lb  HEIGHT 5\' 0"  5\' 0"  5\' 0"   BMI 33.59 kg/m2 35.15 kg/m2 35.15 kg/m2

## 2016-12-12 NOTE — Assessment & Plan Note (Signed)
The increased risk of cardiovascular disease associated with this diagnosis, and the need to consistently work on lifestyle to change this is discussed. Following  a  heart healthy diet ,commitment to 30 minutes of exercise at least 5 days per week, as well as control of blood sugar and cholesterol , and achieving a healthy weight are all the areas to be addressed .  

## 2016-12-12 NOTE — Assessment & Plan Note (Signed)
Ms. Margo AyeHall is reminded of the importance of commitment to daily physical activity for 30 minutes or more, as able and the need to limit carbohydrate intake to 30 to 60 grams per meal to help with blood sugar control.   Ms. Margo AyeHall is reminded of the importance of daily foot exam, annual eye examination, and good blood sugar, blood pressure and cholesterol control.  Diabetic Labs Latest Ref Rng & Units 11/28/2016 04/26/2016 11/10/2015 11/03/2014 07/07/2014  HbA1c <5.7 % 6.5(H) 6.0(H) 6.5(H) 6.6(H) 6.3(H)  Chol <200 mg/dL 161(W200(H) 960183 454(U207(H) 981197 191(Y207(H)  HDL >50 mg/dL 54 46 51 49 47  Calc LDL <100 mg/dL 782(N122(H) 562103 130120 865(H114(H) 846(N129(H)  Triglycerides <150 mg/dL 629122 528(U169(H) 132(G182(H) 401(U169(H) 155(H)  Creatinine 0.50 - 0.99 mg/dL 2.720.63 - 5.360.65 - 6.440.77   BP/Weight 12/11/2016 11/17/2015 11/17/2015 12/03/2014 11/05/2014 08/04/2014 07/21/2014  Systolic BP 124 130 130 148 124 125 149  Diastolic BP 80 80 80 86 80 59 71  Wt. (Lbs) 172 180 180 177 177 - -  BMI 33.59 35.15 35.15 34.57 34.57 - -   No flowsheet data found.

## 2016-12-12 NOTE — Progress Notes (Signed)
Tracy Humphrey     MRN: [id number]      DOB: 1950-09-01   HPI Tracy Humphrey is here for follow up and re-evaluation of chronic medical conditions, medication management and review of any available recent lab and radiology data.  Preventive health is updated, specifically  Cancer screening and Immunization.  Needs colonoscopy and intends to schedule as soon as convenient Currently being evaluated for chronic left shoulder pain by orthopedics, recently had MRI and has follow up The PT denies any adverse reactions to current medications since the last visit.  Has not committed to diabetic diet and regular exercise with deterioration in labs and a new established diagnosis of diabetes which has now triggered a change in her behavior. Now committed to 150 mins of walking per week and healthier diet ROS Denies recent fever or chills. Denies sinus pressure, nasal congestion, ear pain or sore throat. Denies chest congestion, productive cough or wheezing. Denies chest pains, palpitations and leg swelling Denies abdominal pain, nausea, vomiting,diarrhea or constipation.   Denies dysuria, frequency, hesitancy or incontinence.  Denies headaches, seizures, numbness, or tingling. Denies depression, anxiety or insomnia. Denies skin break down or rash.   PE  BP 124/80   Pulse (!) 101   Resp 16   Ht 5' (1.524 m)   Wt 172 lb (78 kg)   SpO2 97%   BMI 33.59 kg/m   Patient alert and oriented and in no cardiopulmonary distress.  HEENT: No facial asymmetry, EOMI,   oropharynx pink and moist.  Neck supple no JVD, no mass.  Chest: Clear to auscultation bilaterally.  CVS: S1, S2 no murmurs, no S3.Regular rate.  ABD: Soft non tender.   Ext: No edema  MS: Adequate ROM spine, , hips and knees.Decreased in left shoulder  Skin: Intact, no ulcerations or rash noted.  Psych: Good eye contact, normal affect. Memory intact not anxious or depressed appearing.  CNS: CN 2-12 intact, power,  normal  throughout.no focal deficits noted.   Assessment & Plan  Diabetes mellitus type 2, diet-controlled (HCC) Tracy Humphrey is reminded of the importance of commitment to daily physical activity for 30 minutes or more, as able and the need to limit carbohydrate intake to 30 to 60 grams per meal to help with blood sugar control.   Tracy Humphrey is reminded of the importance of daily foot exam, annual eye examination, and good blood sugar, blood pressure and cholesterol control.  Diabetic Labs Latest Ref Rng & Units 11/28/2016 04/26/2016 11/10/2015 11/03/2014 07/07/2014  HbA1c <5.7 % 6.5(H) 6.0(H) 6.5(H) 6.6(H) 6.3(H)  Chol <200 mg/dL 409(W) 119 147(W) 295 621(H)  HDL >50 mg/dL 54 46 51 49 47  Calc LDL <100 mg/dL 086(V) 784 696 295(M) 841(L)  Triglycerides <150 mg/dL 244 010(U) 725(D) 664(Q) 155(H)  Creatinine 0.50 - 0.99 mg/dL 0.34 - 7.42 - 5.95   BP/Weight 12/11/2016 11/17/2015 11/17/2015 12/03/2014 11/05/2014 08/04/2014 07/21/2014  Systolic BP 124 130 130 148 124 125 149  Diastolic BP 80 80 80 86 80 59 71  Wt. (Lbs) 172 180 180 177 177 - -  BMI 33.59 35.15 35.15 34.57 34.57 - -   No flowsheet data found.      Seasonal allergies Controlled, no change in medication   VERTIGO Controlled, no change in medication Relies on daily sudafed  Obesity (BMI 30.0-34.9) Improved Patient re-educated about  the importance of commitment to a  minimum of 150 minutes of exercise per week.  The importance of healthy food choices with portion  control discussed. Encouraged to start a food diary, count calories and to consider  joining a support group. Sample diet sheets offered. Goals set by the patient for the next several months.   Weight /BMI 12/11/2016 11/17/2015 11/17/2015  WEIGHT 172 lb 180 lb 180 lb  HEIGHT 5\' 0"  5\' 0"  5\' 0"   BMI 33.59 kg/m2 35.15 kg/m2 35.15 kg/m2      Metabolic syndrome X The increased risk of cardiovascular disease associated with this diagnosis, and the need to consistently work on  lifestyle to change this is discussed. Following  a  heart healthy diet ,commitment to 30 minutes of exercise at least 5 days per week, as well as control of blood sugar and cholesterol , and achieving a healthy weight are all the areas to be addressed .   Dyslipidemia, goal LDL below 100 Deteriorated Hyperlipidemia:Low fat diet discussed and encouraged.   Lipid Panel  Lab Results  Component Value Date   CHOL 200 (H) 11/28/2016   HDL 54 11/28/2016   LDLCALC 122 (H) 11/28/2016   TRIG 122 11/28/2016   CHOLHDL 3.7 11/28/2016   Updated lab needed at/ before next visit.

## 2016-12-12 NOTE — Assessment & Plan Note (Signed)
Controlled, no change in medication Relies on daily sudafed

## 2016-12-13 LAB — MICROALBUMIN / CREATININE URINE RATIO
Creatinine, Urine: 371 mg/dL
MICROALB/CREAT RATIO: 59.2 mg/g{creat} — AB (ref 0.0–30.0)
Microalb, Ur: 219.6 ug/mL — ABNORMAL HIGH

## 2017-01-13 ENCOUNTER — Encounter: Payer: Self-pay | Admitting: Family Medicine

## 2017-04-10 ENCOUNTER — Encounter: Payer: Self-pay | Admitting: Family Medicine

## 2017-04-23 ENCOUNTER — Encounter: Payer: Self-pay | Admitting: Family Medicine

## 2017-04-23 ENCOUNTER — Ambulatory Visit (INDEPENDENT_AMBULATORY_CARE_PROVIDER_SITE_OTHER): Payer: PPO

## 2017-04-23 DIAGNOSIS — Z23 Encounter for immunization: Secondary | ICD-10-CM

## 2017-04-24 ENCOUNTER — Other Ambulatory Visit: Payer: Self-pay | Admitting: Family Medicine

## 2017-04-24 DIAGNOSIS — Z1231 Encounter for screening mammogram for malignant neoplasm of breast: Secondary | ICD-10-CM

## 2017-05-21 ENCOUNTER — Ambulatory Visit: Payer: PPO | Admitting: Family Medicine

## 2017-05-21 ENCOUNTER — Encounter: Payer: Self-pay | Admitting: Family Medicine

## 2017-05-21 VITALS — BP 118/80 | HR 100 | Temp 99.2°F | Resp 16 | Ht 60.0 in | Wt 150.0 lb

## 2017-05-21 DIAGNOSIS — E669 Obesity, unspecified: Secondary | ICD-10-CM | POA: Diagnosis not present

## 2017-05-21 DIAGNOSIS — J029 Acute pharyngitis, unspecified: Secondary | ICD-10-CM

## 2017-05-21 DIAGNOSIS — J302 Other seasonal allergic rhinitis: Secondary | ICD-10-CM

## 2017-05-21 DIAGNOSIS — E785 Hyperlipidemia, unspecified: Secondary | ICD-10-CM

## 2017-05-21 DIAGNOSIS — E119 Type 2 diabetes mellitus without complications: Secondary | ICD-10-CM | POA: Diagnosis not present

## 2017-05-21 NOTE — Patient Instructions (Addendum)
F/u in 6 months, call if you need me sooner  You are being treated for viral pharyngitis, no signs of bacterial infection at the visit  Fluids, rest , and tylenol 325 mg one 3 times daily as needed are recommended  You should improve and get over the fever in the next 2 days. If you progressively worse, please get in touch   Fasting lipid and HBA1C, and TSH this week please  Rapid strep test is negative  Congrats on weight loss   Pharyngitis Pharyngitis is a sore throat (pharynx). There is redness, pain, and swelling of your throat. Follow these instructions at home:  Drink enough fluids to keep your pee (urine) clear or pale yellow.  Only take medicine as told by your doctor. ? You may get sick again if you do not take medicine as told. Finish your medicines, even if you start to feel better. ? Do not take aspirin.  Rest.  Rinse your mouth (gargle) with salt water ( tsp of salt per 1 qt of water) every 1-2 hours. This will help the pain.  If you are not at risk for choking, you can suck on hard candy or sore throat lozenges. Contact a doctor if:  You have large, tender lumps on your neck.  You have a rash.  You cough up green, yellow-brown, or bloody spit. Get help right away if:  You have a stiff neck.  You drool or cannot swallow liquids.  You throw up (vomit) or are not able to keep medicine or liquids down.  You have very bad pain that does not go away with medicine.  You have problems breathing (not from a stuffy nose). This information is not intended to replace advice given to you by your health care provider. Make sure you discuss any questions you have with your health care provider. Document Released: 12/13/2007 Document Revised: 12/02/2015 Document Reviewed: 03/03/2013 Elsevier Interactive Patient Education  2017 [employer]

## 2017-05-24 ENCOUNTER — Encounter: Payer: Self-pay | Admitting: Family Medicine

## 2017-05-24 ENCOUNTER — Other Ambulatory Visit: Payer: Self-pay | Admitting: Family Medicine

## 2017-05-24 MED ORDER — AZITHROMYCIN 250 MG PO TABS: ORAL_TABLET | ORAL | 0 refills | Status: DC

## 2017-05-28 DIAGNOSIS — E119 Type 2 diabetes mellitus without complications: Secondary | ICD-10-CM | POA: Diagnosis not present

## 2017-05-28 DIAGNOSIS — E785 Hyperlipidemia, unspecified: Secondary | ICD-10-CM | POA: Diagnosis not present

## 2017-05-29 ENCOUNTER — Encounter: Payer: Self-pay | Admitting: Family Medicine

## 2017-05-29 LAB — LIPID PANEL
CHOLESTEROL: 193 mg/dL (ref <200)
HDL: 44 mg/dL — AB (ref >50)
LDL Cholesterol (Calc): 123 mg/dL (calc) — ABNORMAL HIGH
Non-HDL Cholesterol (Calc): 149 mg/dL (calc) — ABNORMAL HIGH (ref <130)
Total CHOL/HDL Ratio: 4.4 (calc) (ref <5.0)
Triglycerides: 148 mg/dL (ref <150)

## 2017-05-29 LAB — HEMOGLOBIN A1C
HEMOGLOBIN A1C: 5.8 %{Hb} — AB (ref <5.7)
Mean Plasma Glucose: 120 (calc)
eAG (mmol/L): 6.6 (calc)

## 2017-05-29 LAB — TSH: TSH: 1.79 mIU/L (ref 0.40–4.50)

## 2017-05-30 ENCOUNTER — Telehealth: Payer: Self-pay | Admitting: *Deleted

## 2017-05-30 ENCOUNTER — Encounter: Payer: Self-pay | Admitting: Family Medicine

## 2017-05-30 ENCOUNTER — Other Ambulatory Visit: Payer: Self-pay | Admitting: Family Medicine

## 2017-05-30 ENCOUNTER — Ambulatory Visit (HOSPITAL_COMMUNITY)
Admission: RE | Admit: 2017-05-30 | Discharge: 2017-05-30 | Disposition: A | Discharge status: Home / Self Care | Payer: PPO | Source: Ambulatory Visit | Attending: Family Medicine | Admitting: Family Medicine

## 2017-05-30 DIAGNOSIS — J40 Bronchitis, not specified as acute or chronic: Secondary | ICD-10-CM

## 2017-05-30 DIAGNOSIS — R05 Cough: Secondary | ICD-10-CM | POA: Diagnosis not present

## 2017-05-30 NOTE — Telephone Encounter (Signed)
Patient called left message requesting to talk with Merry ProudBrandi regarding getting a chest xray done, patient states she finished her zpack and she is having pains in between her shoulders and her back. Patient states it is very painful at times. Patient wants to rule out pneumonia. Please advise [phone] or [phone]

## 2017-05-30 NOTE — Telephone Encounter (Signed)
CXR is ordered, pls advise her to have this done

## 2017-05-30 NOTE — Telephone Encounter (Signed)
Pt aware.

## 2017-06-02 DIAGNOSIS — J029 Acute pharyngitis, unspecified: Secondary | ICD-10-CM | POA: Insufficient documentation

## 2017-06-02 NOTE — Assessment & Plan Note (Signed)
Deteriorated Hyperlipidemia:Low fat diet discussed and encouraged.   Lipid Panel  Lab Results  Component Value Date   CHOL 193 05/28/2017   HDL 44 (L) 05/28/2017   LDLCALC 122 (H) 11/28/2016   TRIG 148 05/28/2017   CHOLHDL 4.4 05/28/2017

## 2017-06-02 NOTE — Assessment & Plan Note (Addendum)
Improved. Pt applauded on succesful weight loss through lifestyle change, and encouraged to continue same.  

## 2017-06-02 NOTE — Progress Notes (Signed)
Tracy Humphrey     MRN: [id number]      DOB: [birth year]   HPI [name] is here with a 3 day h/o fever, generalized body aches and sore throat. Drainage is clear and she has no sputum production currently No known direct sick contact, but she cares for her grandchild and is concerned not to pass illness on to her Has been very diligent as far as her exercise and change in diet is concerned and will have needed labs to assess her diabetes and hyperlipidemia in the near future ROS Denies chest pains, palpitations and leg swelling Denies abdominal pain, nausea, vomiting,diarrhea or constipation.   Denies dysuria, frequency, hesitancy or incontinence. Denies joint pain, swelling and limitation in mobility. Denies headaches, seizures, numbness, or tingling. Denies depression, anxiety or insomnia. Denies skin break down or rash.   PE  BP 118/80   Pulse 100   Temp 99.2 F (37.3 C) (Oral)   Resp 16   Ht 5' (1.524 m)   Wt 150 lb (68 kg)   SpO2 97%   BMI 29.29 kg/m   Patient alert and oriented and in no cardiopulmonary distress.  HEENT: No facial asymmetry, EOMI,   oropharynx pink and moist.  Neck supple no JVD, no mass.No sinus tenderness, TM clear bilaterally, mild cervical adenitis Chest: Clear to auscultation bilaterally.  CVS: S1, S2 no murmurs, no S3.Regular rate.  ABD: Soft non tender.   Ext: No edema  MS: Adequate ROM spine, shoulders, hips and knees.  Skin: Intact, no ulcerations or rash noted.  Psych: Good eye contact, normal affect. Memory intact not anxious or depressed appearing.  CNS: CN 2-12 intact, power,  normal throughout.no focal deficits noted.   Assessment & Plan  Acute pharyngitis Rapid strep test , no exudate,visible, symptoms suggestive of viral infection, no antibiotic indicated  at this time Symptomatic treatment only, pt to call back if she deteriorates  Obesity (BMI 30.0-34.9) Improved. Pt applauded on succesful weight loss through lifestyle  change, and encouraged to continue same.    Dyslipidemia, goal LDL below 100 Deteriorated Hyperlipidemia:Low fat diet discussed and encouraged.   Lipid Panel  Lab Results  Component Value Date   CHOL 193 05/28/2017   HDL 44 (L) 05/28/2017   LDLCALC 122 (H) 11/28/2016   TRIG 148 05/28/2017   CHOLHDL 4.4 05/28/2017       Diabetes mellitus type 2, diet-controlled (HCC) Markedly improved with lifestyle [name] is reminded of the importance of commitment to daily physical activity for 30 minutes or more, as able and the need to limit carbohydrate intake to 30 to 60 grams per meal to help with blood sugar control.     [name] is reminded of the importance of daily foot exam, annual eye examination, and good blood sugar, blood pressure and cholesterol control.  Diabetic Labs Latest Ref Rng & Units 05/28/2017 12/12/2016 11/28/2016 04/26/2016 11/10/2015  HbA1c <5.7 % of total Hgb 5.8(H) - 6.5(H) 6.0(H) 6.5(H)  Microalbumin Not Estab. ug/mL - 219.6(H) - - -  Micro/Creat Ratio 0.0 - 30.0 mg/g creat - 59.2(H) - - -  Chol <200 mg/dL 782193 - 956(O200(H) 130183 865(H207(H)  HDL >50 mg/dL 84(O44(L) - 54 46 51  Calc LDL <100 mg/dL - - 962(X122(H) 528103 413120  Triglycerides <150 mg/dL 244148 - 010122 272(Z169(H) 366(Y182(H)  Creatinine 0.50 - 0.99 mg/dL - - 4.030.63 - 4.740.65   BP/Weight 05/21/2017 12/11/2016 11/17/2015 11/17/2015 12/03/2014 11/05/2014 08/04/2014  Systolic BP 118 124 130 130 148  124 125  Diastolic BP 80 80 80 80 86 80 59  Wt. (Lbs) 150 172 180 180 177 177 -  BMI 29.29 33.59 35.15 35.15 34.57 34.57 -   Foot/eye exam completion dates Latest Ref Rng & Units 12/11/2016 08/16/2016  Eye Exam No Retinopathy - No Retinopathy  Foot Form Completion - Done -        Seasonal allergies Increased symptoms with change in weather  And excessive exposure to the elenments

## 2017-06-02 NOTE — Assessment & Plan Note (Signed)
Rapid strep test , no exudate,visible, symptoms suggestive of viral infection, no antibiotic indicated  at this time Symptomatic treatment only, pt to call back if she deteriorates

## 2017-06-02 NOTE — Assessment & Plan Note (Signed)
Markedly improved with lifestyle [name] is reminded of the importance of commitment to daily physical activity for 30 minutes or more, as able and the need to limit carbohydrate intake to 30 to 60 grams per meal to help with blood sugar control.     [name] is reminded of the importance of daily foot exam, annual eye examination, and good blood sugar, blood pressure and cholesterol control.  Diabetic Labs Latest Ref Rng & Units 05/28/2017 12/12/2016 11/28/2016 04/26/2016 11/10/2015  HbA1c <5.7 % of total Hgb 5.8(H) - 6.5(H) 6.0(H) 6.5(H)  Microalbumin Not Estab. ug/mL - 219.6(H) - - -  Micro/Creat Ratio 0.0 - 30.0 mg/g creat - 59.2(H) - - -  Chol <200 mg/dL 409193 - 811(B200(H) 147183 829(F207(H)  HDL >50 mg/dL 62(Z44(L) - 54 46 51  Calc LDL <100 mg/dL - - 308(M122(H) 578103 469120  Triglycerides <150 mg/dL 629148 - 528122 413(K169(H) 440(N182(H)  Creatinine 0.50 - 0.99 mg/dL - - 0.270.63 - 2.530.65   BP/Weight 05/21/2017 12/11/2016 11/17/2015 11/17/2015 12/03/2014 11/05/2014 08/04/2014  Systolic BP 118 124 130 130 148 124 125  Diastolic BP 80 80 80 80 86 80 59  Wt. (Lbs) 150 172 180 180 177 177 -  BMI 29.29 33.59 35.15 35.15 34.57 34.57 -   Foot/eye exam completion dates Latest Ref Rng & Units 12/11/2016 08/16/2016  Eye Exam No Retinopathy - No Retinopathy  Foot Form Completion - Done -

## 2017-06-02 NOTE — Assessment & Plan Note (Signed)
Increased symptoms with change in weather  And excessive exposure to the elenments

## 2017-06-04 LAB — POCT RAPID STREP A (OFFICE): Rapid Strep A Screen: NEGATIVE

## 2017-06-04 NOTE — Addendum Note (Signed)
Addended by: Abner GreenspanHUDY, BRANDI H on: 06/04/2017 08:34 AM   Modules accepted: Orders

## 2017-06-06 ENCOUNTER — Other Ambulatory Visit: Payer: Self-pay | Admitting: Family Medicine

## 2017-06-06 ENCOUNTER — Telehealth: Payer: Self-pay

## 2017-06-06 ENCOUNTER — Ambulatory Visit (HOSPITAL_COMMUNITY)
Admission: RE | Admit: 2017-06-06 | Discharge: 2017-06-06 | Disposition: A | Discharge status: Home / Self Care | Payer: PPO | Source: Ambulatory Visit | Attending: Family Medicine | Admitting: Family Medicine

## 2017-06-06 DIAGNOSIS — J329 Chronic sinusitis, unspecified: Secondary | ICD-10-CM | POA: Diagnosis not present

## 2017-06-06 DIAGNOSIS — R091 Pleurisy: Secondary | ICD-10-CM

## 2017-06-06 DIAGNOSIS — M791 Myalgia, unspecified site: Secondary | ICD-10-CM | POA: Diagnosis not present

## 2017-06-06 DIAGNOSIS — R05 Cough: Secondary | ICD-10-CM | POA: Diagnosis not present

## 2017-06-06 DIAGNOSIS — R071 Chest pain on breathing: Secondary | ICD-10-CM

## 2017-06-06 LAB — CBC WITH DIFFERENTIAL/PLATELET
BASOS ABS: 28 {cells}/uL (ref 0–200)
BASOS PCT: 0.2 %
Eosinophils Absolute: 14 cells/uL — ABNORMAL LOW (ref 15–500)
Eosinophils Relative: 0.1 %
HCT: 34.6 % — ABNORMAL LOW (ref 35.0–45.0)
HEMOGLOBIN: 12.2 g/dL (ref 11.7–15.5)
Lymphs Abs: 3271 cells/uL (ref 850–3900)
MCH: 30.3 pg (ref 27.0–33.0)
MCHC: 35.3 g/dL (ref 32.0–36.0)
MCV: 85.9 fL (ref 80.0–100.0)
MONOS PCT: 7.7 %
MPV: 11.5 fL (ref 7.5–12.5)
NEUTROS ABS: 9425 {cells}/uL — AB (ref 1500–7800)
Neutrophils Relative %: 68.3 %
Platelets: 234 10*3/uL (ref 140–400)
RBC: 4.03 10*6/uL (ref 3.80–5.10)
RDW: 12 % (ref 11.0–15.0)
Total Lymphocyte: 23.7 %
WBC mixed population: 1063 cells/uL — ABNORMAL HIGH (ref 200–950)
WBC: 13.8 10*3/uL — AB (ref 3.8–10.8)

## 2017-06-06 LAB — SEDIMENTATION RATE: SED RATE: 99 mm/h — AB (ref 0–30)

## 2017-06-06 NOTE — Telephone Encounter (Signed)
Still having head congestion, no recent fever, generalized aches and drain age , drainage is clear, , dry cough, pain between shoulder blades, CBC and diff , ESR , CT sinius  Chest scan no contrast  Pls schedule chest scan, and let her knwo when and order the labs  OK to leave info on cell [phone]

## 2017-06-06 NOTE — Telephone Encounter (Signed)
Patient aware and precert started for CT. Will go today and have labs and xray

## 2017-06-06 NOTE — Addendum Note (Signed)
Addended by: Abner GreenspanHUDY, Nour Scalise H on: 06/06/2017 11:56 AM   Modules accepted: Orders

## 2017-06-06 NOTE — Telephone Encounter (Signed)
Concerned because she still has congestion and still hurting between her shoulder blades. States its very uncomfortable and she hasn't been able to do any walking or exercise and she wants to know what she needs to do and if she needs another zpak? Please advise

## 2017-06-07 ENCOUNTER — Ambulatory Visit (INDEPENDENT_AMBULATORY_CARE_PROVIDER_SITE_OTHER): Payer: PPO | Admitting: Family Medicine

## 2017-06-07 ENCOUNTER — Ambulatory Visit (HOSPITAL_COMMUNITY)
Admission: RE | Admit: 2017-06-07 | Discharge: 2017-06-07 | Disposition: A | Discharge status: Home / Self Care | Payer: PPO | Source: Ambulatory Visit | Attending: Family Medicine | Admitting: Family Medicine

## 2017-06-07 ENCOUNTER — Encounter: Payer: Self-pay | Admitting: Family Medicine

## 2017-06-07 ENCOUNTER — Other Ambulatory Visit: Payer: Self-pay | Admitting: Family Medicine

## 2017-06-07 ENCOUNTER — Telehealth: Payer: Self-pay

## 2017-06-07 VITALS — BP 118/80 | HR 97 | Temp 98.7°F | Resp 16 | Ht 60.0 in | Wt 146.0 lb

## 2017-06-07 DIAGNOSIS — M1990 Unspecified osteoarthritis, unspecified site: Secondary | ICD-10-CM | POA: Insufficient documentation

## 2017-06-07 DIAGNOSIS — I2510 Atherosclerotic heart disease of native coronary artery without angina pectoris: Secondary | ICD-10-CM | POA: Diagnosis not present

## 2017-06-07 DIAGNOSIS — Z1231 Encounter for screening mammogram for malignant neoplasm of breast: Secondary | ICD-10-CM | POA: Insufficient documentation

## 2017-06-07 DIAGNOSIS — R071 Chest pain on breathing: Secondary | ICD-10-CM | POA: Insufficient documentation

## 2017-06-07 DIAGNOSIS — R700 Elevated erythrocyte sedimentation rate: Secondary | ICD-10-CM | POA: Insufficient documentation

## 2017-06-07 DIAGNOSIS — R059 Cough, unspecified: Secondary | ICD-10-CM | POA: Insufficient documentation

## 2017-06-07 DIAGNOSIS — D72825 Bandemia: Secondary | ICD-10-CM

## 2017-06-07 DIAGNOSIS — R5383 Other fatigue: Secondary | ICD-10-CM | POA: Diagnosis not present

## 2017-06-07 DIAGNOSIS — R0781 Pleurodynia: Secondary | ICD-10-CM

## 2017-06-07 DIAGNOSIS — M2578 Osteophyte, vertebrae: Secondary | ICD-10-CM | POA: Insufficient documentation

## 2017-06-07 DIAGNOSIS — R05 Cough: Secondary | ICD-10-CM | POA: Diagnosis not present

## 2017-06-07 MED ORDER — BENZONATATE 100 MG PO CAPS: 100.0000 mg | ORAL_CAPSULE | Freq: Two times a day (BID) | ORAL | 0 refills | Status: DC | PRN

## 2017-06-07 MED ORDER — LEVOFLOXACIN 500 MG PO TABS: 500.0000 mg | ORAL_TABLET | Freq: Every day | ORAL | 0 refills | Status: DC

## 2017-06-07 NOTE — Telephone Encounter (Signed)
PT AWARE AND LAB ORDER SENT  

## 2017-06-07 NOTE — Assessment & Plan Note (Signed)
3 week h/o cough, chills and fever, z pack has been taken symptoms reported as worse , levaquin prescribed with follow up Needs repeat labs in 2 weeks

## 2017-06-07 NOTE — Assessment & Plan Note (Signed)
3 week h/o pleuritic chest pain with clinical deterioration involving intermittent chills  And sweats, worsening fatigue, band emia and generalized stiffness and pain. chest scan urgently. Empiric treatment with levaquin. Repeat labs in 2 weeks. May need rheumatology eval. D/c pulmonary eval in l;ight of negative chest scan Pt advised to go to the ED if she  Deteriorates. Will f/u in 4 days

## 2017-06-07 NOTE — Patient Instructions (Signed)
F/U as before  You are referred to lung sp[ecialist , I will call with appt info today  Pls get chest scan when you leave  If no appt with lung Doc today pls fill and start levaquin one daily and decongestant tessalon perles which have been sent to your pharmacy. I will call you   Results are being released to you, that we discussedc

## 2017-06-07 NOTE — Progress Notes (Signed)
   Tracy ConesJoan S Humphrey     MRN: [id number]      DOB: 1951/01/31   HPI [name] is here c/o feeling progressively ill in the past 1 week, with 2 episodes of excessive sweats accompanied by chills, where she had to wrap herself up in a blanket and dress in multiple layers of pyjamas once last week, and the other time was last night, she states she felt ill enough to  Go to the hospital,   she has had increased fatigue. She c/o pain between her shoulder blades and also still has pain with deep breathing , just does not feel well despite being treated with antibiotics recently Head is stuffy, drainage is clear, ears do not hurt, throat does not hurt  ROS Denies , palpitations and leg swelling Denies abdominal pain, nausea, vomiting,diarrhea or constipation.   Denies dysuria, frequency, hesitancy or incontinence. C/o generalized  joint pain, stiffness  and limitation in mobility. Denies headaches, seizures, numbness, or tingling. Denies depression, anxiety or insomnia. Denies skin break down or rash.   PE  BP 118/80   Pulse 97   Temp 98.7 F (37.1 C) (Oral)   Resp 16   Ht 5' (1.524 m)   Wt 146 lb (66.2 kg)   SpO2 97%   BMI 28.51 kg/m   Patient alert and oriented and in no cardiopulmonary distress.  HEENT: No facial asymmetry, EOMI,   oropharynx pink and moist.  Neck supple no JVD, no mass.  Chest: Clear to auscultation bilaterally.  CVS: S1, S2 no murmurs, no S3.Regular rate.  ABD: Soft non tender.   Ext: No edema    Skin: Intact, no ulcerations or rash noted.  Psych: Good eye contact, normal affect. Memory intact not anxious or depressed appearing.  CNS: CN 2-12 intact, power,  normal throughout.no focal deficits noted.   Assessment & Plan  Pleuritic chest pain 3 week h/o pleuritic chest pain with clinical deterioration involving intermittent chills  And sweats, worsening fatigue, band emia and generalized stiffness and pain. chest scan urgently. Empiric treatment with  levaquin. Repeat labs in 2 weeks. May need rheumatology eval. D/c pulmonary eval in l;ight of negative chest scan Pt advised to go to the ED if she  Deteriorates. Will f/u in 4 days  Elevated sedimentation rate rept in 2 weeks , unclear if due to infection oir inflammation  Cough 3 week h/o cough, chills and fever, z pack has been taken symptoms reported as worse , levaquin prescribed with follow up Needs repeat labs in 2 weeks

## 2017-06-07 NOTE — Assessment & Plan Note (Signed)
rept in 2 weeks , unclear if due to infection oir inflammation

## 2017-06-09 ENCOUNTER — Emergency Department (HOSPITAL_COMMUNITY)
Admission: EM | Admit: 2017-06-09 | Discharge: 2017-06-09 | Disposition: A | Discharge status: Home / Self Care | Payer: PPO | Attending: Emergency Medicine | Admitting: Emergency Medicine

## 2017-06-09 ENCOUNTER — Encounter (HOSPITAL_COMMUNITY): Payer: Self-pay | Admitting: Emergency Medicine

## 2017-06-09 ENCOUNTER — Other Ambulatory Visit: Payer: Self-pay

## 2017-06-09 ENCOUNTER — Emergency Department (HOSPITAL_COMMUNITY): Payer: PPO

## 2017-06-09 DIAGNOSIS — D72829 Elevated white blood cell count, unspecified: Secondary | ICD-10-CM | POA: Diagnosis not present

## 2017-06-09 DIAGNOSIS — I5030 Unspecified diastolic (congestive) heart failure: Secondary | ICD-10-CM | POA: Diagnosis not present

## 2017-06-09 DIAGNOSIS — R42 Dizziness and giddiness: Secondary | ICD-10-CM | POA: Insufficient documentation

## 2017-06-09 DIAGNOSIS — Z79899 Other long term (current) drug therapy: Secondary | ICD-10-CM | POA: Insufficient documentation

## 2017-06-09 DIAGNOSIS — E119 Type 2 diabetes mellitus without complications: Secondary | ICD-10-CM | POA: Diagnosis not present

## 2017-06-09 DIAGNOSIS — R509 Fever, unspecified: Secondary | ICD-10-CM | POA: Diagnosis not present

## 2017-06-09 DIAGNOSIS — Z7982 Long term (current) use of aspirin: Secondary | ICD-10-CM | POA: Diagnosis not present

## 2017-06-09 DIAGNOSIS — J9811 Atelectasis: Secondary | ICD-10-CM | POA: Diagnosis not present

## 2017-06-09 DIAGNOSIS — R5383 Other fatigue: Secondary | ICD-10-CM | POA: Diagnosis present

## 2017-06-09 DIAGNOSIS — R51 Headache: Secondary | ICD-10-CM | POA: Diagnosis not present

## 2017-06-09 DIAGNOSIS — M791 Myalgia, unspecified site: Secondary | ICD-10-CM | POA: Diagnosis not present

## 2017-06-09 DIAGNOSIS — M542 Cervicalgia: Secondary | ICD-10-CM | POA: Diagnosis not present

## 2017-06-09 LAB — COMPREHENSIVE METABOLIC PANEL
ALT: 53 U/L (ref 14–54)
AST: 45 U/L — AB (ref 15–41)
Albumin: 3.2 g/dL — ABNORMAL LOW (ref 3.5–5.0)
Alkaline Phosphatase: 89 U/L (ref 38–126)
Anion gap: 8 (ref 5–15)
BUN: 22 mg/dL — AB (ref 6–20)
CALCIUM: 9.5 mg/dL (ref 8.9–10.3)
CO2: 29 mmol/L (ref 22–32)
CREATININE: 0.61 mg/dL (ref 0.44–1.00)
Chloride: 104 mmol/L (ref 101–111)
GFR calc Af Amer: >60 mL/min (ref >60)
Glucose, Bld: 125 mg/dL — ABNORMAL HIGH (ref 65–99)
Potassium: 4.2 mmol/L (ref 3.5–5.1)
Sodium: 141 mmol/L (ref 135–145)
Total Bilirubin: 0.8 mg/dL (ref 0.3–1.2)
Total Protein: 8.1 g/dL (ref 6.5–8.1)

## 2017-06-09 LAB — CBC WITH DIFFERENTIAL/PLATELET
BASOS ABS: 0 10*3/uL (ref 0.0–0.1)
Basophils Relative: 0 %
Eosinophils Absolute: 0 10*3/uL (ref 0.0–0.7)
Eosinophils Relative: 0 %
HEMATOCRIT: 35.2 % — AB (ref 36.0–46.0)
Hemoglobin: 11.5 g/dL — ABNORMAL LOW (ref 12.0–15.0)
LYMPHS ABS: 2.1 10*3/uL (ref 0.7–4.0)
LYMPHS PCT: 16 %
MCH: 29.7 pg (ref 26.0–34.0)
MCHC: 32.7 g/dL (ref 30.0–36.0)
MCV: 91 fL (ref 78.0–100.0)
MONO ABS: 1.1 10*3/uL — AB (ref 0.1–1.0)
Monocytes Relative: 8 %
Neutro Abs: 9.9 10*3/uL — ABNORMAL HIGH (ref 1.7–7.7)
Neutrophils Relative %: 76 %
Platelets: 242 10*3/uL (ref 150–400)
RBC: 3.87 MIL/uL (ref 3.87–5.11)
RDW: 12.3 % (ref 11.5–15.5)
WBC: 13.1 10*3/uL — AB (ref 4.0–10.5)

## 2017-06-09 LAB — LACTIC ACID, PLASMA: Lactic Acid, Venous: 0.7 mmol/L (ref 0.5–1.9)

## 2017-06-09 MED ORDER — SODIUM CHLORIDE 0.9 % IV SOLN
INTRAVENOUS | Status: DC
  Administered 2017-06-09: 75 mL/h via INTRAVENOUS

## 2017-06-09 MED ORDER — SODIUM CHLORIDE 0.9 % IV BOLUS (SEPSIS)
500.0000 mL | Freq: Once | INTRAVENOUS | Status: AC
  Administered 2017-06-09: 500 mL via INTRAVENOUS

## 2017-06-09 MED ORDER — IOPAMIDOL (ISOVUE-300) INJECTION 61%
100.0000 mL | Freq: Once | INTRAVENOUS | Status: AC | PRN
  Administered 2017-06-09: 100 mL via INTRAVENOUS

## 2017-06-09 NOTE — ED Provider Notes (Signed)
[facility] EMERGENCY DEPARTMENT Provider Note   CSN: [id number] Arrival date & time: 06/09/17  1542     History   Chief Complaint Chief Complaint  Patient presents with  . Fatigue    HPI Tracy Humphrey is a 66 y.o. female.  Patient followed by Tracy OvermanMargaret Humphrey.  Patient is been having symptoms since the beginning of November.  Initially thought maybe to be a pharyngitis upper respiratory infection then may be sinus infection.  Treated with Z-Pak without any improvement.  Patient had extensive lab work done by her primary care doctor to include CT of chest without any acute findings.  Patient here for the past 2 days has had fever chills and night sweats.  This is a new finding.  Primary was considering referral to room patient without any abdominal complaints.  No headache does have some right-sided neck tenderness and jaw feels tight on that side.  Has had body aches.  And some joint aches.  Patient's previous labs had a sed rate at 99.  White blood cell count at 13,000 and differential without significant abnormalities.      Past Medical History:  Diagnosis Date  . Allergy    prrenial  . BPV (benign positional vertigo) 11/04/2011  . GERD (gastroesophageal reflux disease)   . Helicobacter pylori ab+ 2014   treated by GI  . Hyperlipidemia 2013  . IGT (impaired glucose tolerance) 2013  . Mild diastolic dysfunction 06/2013   grade 2  . Obesity 2007    Prior to this had no weight issue   . Superficial thrombophlebitis 2015  . Vertigo     Patient Active Problem List   Diagnosis Date Noted  . Pleuritic chest pain 06/07/2017  . Elevated sedimentation rate 06/07/2017  . Arthritis 06/07/2017  . Cough 06/07/2017  . Seasonal allergies 11/13/2013  . Obesity (BMI 30.0-34.9) 06/15/2013  . Diabetes mellitus type 2, diet-controlled (HCC) 10/19/2011  . Dyslipidemia, goal LDL below 100 10/19/2011  . Metabolic syndrome X 10/19/2011  . VERTIGO 05/25/2010    Past Surgical  History:  Procedure Laterality Date  . BREAST BIOPSY Right    cyst  . CATARACT EXTRACTION W/PHACO Left 07/21/2014   Procedure: CATARACT EXTRACTION PHACO AND INTRAOCULAR LENS PLACEMENT (IOC);  Surgeon: Loraine LericheMark T. Nile RiggsShapiro, MD;  Location: [facility];  Service: Ophthalmology;  Laterality: Left;  CDE 3.91  . CATARACT EXTRACTION W/PHACO Right 08/04/2014   Procedure: CATARACT EXTRACTION PHACO AND INTRAOCULAR LENS PLACEMENT; CDE:  5.38;  Surgeon: Loraine LericheMark T. Nile RiggsShapiro, MD;  Location: [facility];  Service: Ophthalmology;  Laterality: Right;  . CESAREAN SECTION  29 years ago   . COLONOSCOPY  02/13/2005   Dr. Jena Gaussourk- normal rectum, normal colon  . ESOPHAGOGASTRODUODENOSCOPY N/A 06/26/2013   Procedure: ESOPHAGOGASTRODUODENOSCOPY (EGD);  Surgeon: Corbin Adeobert M Rourk, MD;  Location: [facility] SUITE;  Service: Endoscopy;  Laterality: N/A;  8:00  . EYE SURGERY Bilateral 2015   cataract extraction  . Right foot surgery 2003. 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 Authorizing Provider  aspirin (ASPIRIN LOW DOSE) 81 MG EC tablet Take 81 mg by mouth daily.     Yes [provider]  azelastine (ASTELIN) 0.1 % nasal spray Place 2 sprays into both nostrils 2 (two) times daily. Use in  each nostril as directed 07/13/15 06/09/17 Yes Kerri Perches, MD  benzonatate (TESSALON) 100 MG capsule Take 1 capsule (100 mg total) by mouth 2 (two) times daily as needed for cough. 06/07/17  Yes Kerri Perches, MD  calcium carbonate (TUMS - DOSED IN MG ELEMENTAL CALCIUM) 500 MG chewable tablet Chew 2 tablets by mouth daily.     Yes [provider]  Isopropyl Alcohol (SWIMMERS EAR DROPS) 95 % LIQD Place 1-4 drops into both ears daily as needed.   Yes [provider]  levofloxacin (LEVAQUIN) 500 MG tablet Take 1 tablet (500 mg total) by mouth daily.  06/07/17  Yes Kerri Perches, MD  loratadine (CLARITIN) 10 MG tablet Take 1 tablet (10 mg total) by mouth daily. 02/03/16  Yes Kerri Perches, MD  Multiple Vitamin (TAB-A-VITE) TABS 1 tablet daily Patient taking differently: Take 1 tablet by mouth daily. 1 tablet daily 10/26/15  Yes Kerri Perches, MD  naproxen sodium (ALEVE) 220 MG tablet Take 220 mg by mouth 2 (two) times daily as needed.   Yes [provider]  pseudoephedrine (SUDAFED) 30 MG tablet Take 30 mg by mouth daily.   Yes [provider]  azithromycin (ZITHROMAX) 250 MG tablet Two tablets on day one , then one tablet once daily for an additional four days Patient not taking: Reported on 06/09/2017 05/24/17   Kerri Perches, MD    Family History Family History  Problem Relation Age of Onset  . Dementia Mother 28  . Hypertension Mother   . Hypertension Father   . Heart disease Father   . Diabetes Father   . AAA (abdominal aortic aneurysm) Father   . Hypertension Sister   . Coronary artery disease Sister 31  . Hypertension Brother 86  . Alcohol abuse Brother   . AAA (abdominal aortic aneurysm) Paternal Grandfather     Social History Social History   Tobacco Use  . Smoking status: Never Smoker  . Smokeless tobacco: Never Used  . Tobacco comment: Never smoke  Substance Use Topics  . Alcohol use: No  . Drug use: No     Allergies   Prednisone and Penicillins   Review of Systems Review of Systems  Constitutional: Positive for chills, diaphoresis, fatigue and fever.  HENT: Positive for sore throat. Negative for congestion.   Eyes: Negative for visual disturbance.  Respiratory: Negative for shortness of breath.   Cardiovascular: Negative for chest pain.  Gastrointestinal: Negative for abdominal pain, diarrhea, nausea and vomiting.  Genitourinary: Negative for dysuria and hematuria.  Musculoskeletal: Positive for arthralgias, myalgias and neck pain.  Skin: Negative for rash.    Neurological: Negative for dizziness, weakness and headaches.  Hematological: Does not bruise/bleed easily.  Psychiatric/Behavioral: Negative for confusion.     Physical Exam Updated Vital Signs BP (!) 148/71 (BP Location: Left Arm)   Pulse 97   Temp 98.4 F (36.9 C) (Oral)   Resp 17   Ht 1.524 m (5')   Wt 64.9 kg (143 lb)   SpO2 98%   BMI 27.93 kg/m   Physical Exam  Constitutional: She is oriented to person, place, and time. She appears well-developed. No distress.  HENT:  Head: Normocephalic and atraumatic.  Right Ear: External ear normal.  Mouth/Throat: Oropharynx is clear and moist.  Eyes: Conjunctivae and EOM are normal. Pupils are equal, round, and reactive to light.  Neck: Normal range of motion. Neck supple.  Cardiovascular: Normal rate and regular rhythm.  No murmur heard. Pulmonary/Chest:  Effort normal and breath sounds normal. No respiratory distress.  Abdominal: Soft. Bowel sounds are normal. There is no tenderness.  Musculoskeletal: Normal range of motion. She exhibits no edema.  Lymphadenopathy:    She has no cervical adenopathy.  Neurological: She is alert and oriented to person, place, and time.  Skin: Skin is warm. No rash noted. No erythema.  Nursing note and vitals reviewed.    ED Treatments / Results  Labs (all labs ordered are listed, but only abnormal results are displayed) Labs Reviewed  CBC WITH DIFFERENTIAL/PLATELET - Abnormal; Notable for the following components:      Result Value   WBC 13.1 (*)    Hemoglobin 11.5 (*)    HCT 35.2 (*)    Neutro Abs 9.9 (*)    Monocytes Absolute 1.1 (*)    All other components within normal limits  COMPREHENSIVE METABOLIC PANEL - Abnormal; Notable for the following components:   Glucose, Bld 125 (*)    BUN 22 (*)    Albumin 3.2 (*)    AST 45 (*)    All other components within normal limits  CULTURE, BLOOD (ROUTINE X 2)  CULTURE, BLOOD (ROUTINE X 2)  LACTIC ACID, PLASMA    EKG  EKG  Interpretation None       Radiology Ct Head Wo Contrast  Result Date: 06/09/2017 CLINICAL DATA:  Elevated white count with fever and chills, pain to the right side of head neck stiffness and fatigue EXAM: CT HEAD WITHOUT CONTRAST TECHNIQUE: Contiguous axial images were obtained from the base of the skull through the vertex without intravenous contrast. COMPARISON:  06/06/2017, MRI 06/22/2011 FINDINGS: Brain: No acute territorial infarction, hemorrhage or intracranial mass is visualized. Minimal small vessel ischemic changes of the white matter. Nonenlarged ventricles. Vascular: No hyperdense vessels.  No unexpected calcification Skull: Normal. Negative for fracture or focal lesion. Sinuses/Orbits: Mild mucosal thickening in the ethmoid sinuses. No acute orbital abnormality Other: None IMPRESSION: No CT evidence for acute intracranial abnormality. Electronically Signed   By: Jasmine Pang M.D.   On: 06/09/2017 18:43   Ct Soft Tissue Neck W Contrast  Result Date: 06/09/2017 CLINICAL DATA:  Leukocytosis, fever and chills. Feeling sick since May 18, 2017. Completed Z-Pak. RIGHT retroauricular and jaw pain. Sore throat and neck stiffness. EXAM: CT NECK WITH CONTRAST TECHNIQUE: Multidetector CT imaging of the neck was performed using the standard protocol following the bolus administration of intravenous contrast. CONTRAST:  ISOVUE-300 IOPAMIDOL (ISOVUE-300) INJECTION 61% COMPARISON:  CT chest June 07, 2017 FINDINGS: PHARYNX AND LARYNX: Normal.  Widely patent airway. SALIVARY GLANDS: Normal. THYROID: Normal. LYMPH NODES: No lymphadenopathy by CT size criteria. VASCULAR: Mild calcific atherosclerosis carotid bifurcations. LIMITED INTRACRANIAL: Normal. VISUALIZED ORBITS: Normal. MASTOIDS AND VISUALIZED PARANASAL SINUSES: Status post bilateral ocular lens implants. Paranasal sinuses and mastoid air cells are well aerated. SKELETON: Grade 1 C4-5 anterolisthesis without spondylolysis. Severe LEFT  upper cervical facet arthropathy. Moderate to severe C5-6 and C6-7 degenerative discs. Severe LEFT C4-5, severe RIGHT C5-6 neural foraminal narrowing. UPPER CHEST: Lung apices are clear. No superior mediastinal lymphadenopathy. OTHER: None. IMPRESSION: 1. No acute process in the neck. 2. Severe LEFT C4-5 and severe RIGHT C5-6 neural foraminal narrowing. Electronically Signed   By: Awilda Metro M.D.   On: 06/09/2017 18:50   Ct Chest W Contrast  Result Date: 06/09/2017 CLINICAL DATA:  High white blood cell count with fever and chills. Patient recently treated with Z-Pak. EXAM: CT CHEST, ABDOMEN, AND PELVIS WITH CONTRAST TECHNIQUE: Multidetector  CT imaging of the chest, abdomen and pelvis was performed following the standard protocol during bolus administration of intravenous contrast. CONTRAST:  ISOVUE-300 IOPAMIDOL (ISOVUE-300) INJECTION 61% COMPARISON:  CT of the chest June 07, 2017 FINDINGS: CT CHEST FINDINGS Cardiovascular: The heart is unchanged. Coronary artery calcifications. The thoracic aorta demonstrates no aneurysm or dissection. The central pulmonary artery is normal in caliber. No obvious filling defects on limited images. Mediastinum/Nodes: Small thyroid nodules. The esophagus is normal. No adenopathy in the chest. No effusions. Lungs/Pleura: Central airways are normal. No pneumothorax. No nodule or mass. No focal infiltrate. Mild scattered ground-glass. I favor regions of subsegmental atelectasis and respiratory motion. Subtle atypical infection considered less likely. Musculoskeletal: See below CT ABDOMEN PELVIS FINDINGS Hepatobiliary: 1 or 2 probable tiny cysts are seen in the liver but too small to characterize. No suspicious liver mass. The gallbladder, visualized common bile duct, and visualized portal vein are normal. Pancreas: Unremarkable. No pancreatic ductal dilatation or surrounding inflammatory changes. Spleen: Normal in size without focal abnormality. Adrenals/Urinary  Tract: Adrenal glands are normal. The kidneys demonstrate no stones or suspicious masses. There is a small cyst in the lower left kidney. No hydronephrosis or perinephric stranding. No ureteral stones. The bladder is unremarkable. Stomach/Bowel: The stomach and small bowel are normal. Scattered colonic diverticuli are seen without diverticulitis. The appendix is unremarkable. Vascular/Lymphatic: Atherosclerotic changes are seen in the nonaneurysmal aorta and iliac vessels. No adenopathy. Reproductive: Uterus and bilateral adnexa are unremarkable. Other: No free air or free fluid. Musculoskeletal: No acute or significant osseous findings. IMPRESSION: 1. Mild scattered ground-glass in the chest is favored to represent scattered subsegmental atelectasis. A subtle atypical infection is considered less likely. No focal infiltrate. 2. No cause for symptoms in the abdomen or pelvis. 3. Atherosclerotic changes in the aorta. Coronary artery calcifications. Electronically Signed   By: Gerome Sam III M.D   On: 06/09/2017 19:00   Ct Abdomen Pelvis W Contrast  Result Date: 06/09/2017 CLINICAL DATA:  High white blood cell count with fever and chills. Patient recently treated with Z-Pak. EXAM: CT CHEST, ABDOMEN, AND PELVIS WITH CONTRAST TECHNIQUE: Multidetector CT imaging of the chest, abdomen and pelvis was performed following the standard protocol during bolus administration of intravenous contrast. CONTRAST:  ISOVUE-300 IOPAMIDOL (ISOVUE-300) INJECTION 61% COMPARISON:  CT of the chest June 07, 2017 FINDINGS: CT CHEST FINDINGS Cardiovascular: The heart is unchanged. Coronary artery calcifications. The thoracic aorta demonstrates no aneurysm or dissection. The central pulmonary artery is normal in caliber. No obvious filling defects on limited images. Mediastinum/Nodes: Small thyroid nodules. The esophagus is normal. No adenopathy in the chest. No effusions. Lungs/Pleura: Central airways are normal. No  pneumothorax. No nodule or mass. No focal infiltrate. Mild scattered ground-glass. I favor regions of subsegmental atelectasis and respiratory motion. Subtle atypical infection considered less likely. Musculoskeletal: See below CT ABDOMEN PELVIS FINDINGS Hepatobiliary: 1 or 2 probable tiny cysts are seen in the liver but too small to characterize. No suspicious liver mass. The gallbladder, visualized common bile duct, and visualized portal vein are normal. Pancreas: Unremarkable. No pancreatic ductal dilatation or surrounding inflammatory changes. Spleen: Normal in size without focal abnormality. Adrenals/Urinary Tract: Adrenal glands are normal. The kidneys demonstrate no stones or suspicious masses. There is a small cyst in the lower left kidney. No hydronephrosis or perinephric stranding. No ureteral stones. The bladder is unremarkable. Stomach/Bowel: The stomach and small bowel are normal. Scattered colonic diverticuli are seen without diverticulitis. The appendix is unremarkable. Vascular/Lymphatic: Atherosclerotic changes  are seen in the nonaneurysmal aorta and iliac vessels. No adenopathy. Reproductive: Uterus and bilateral adnexa are unremarkable. Other: No free air or free fluid. Musculoskeletal: No acute or significant osseous findings. IMPRESSION: 1. Mild scattered ground-glass in the chest is favored to represent scattered subsegmental atelectasis. A subtle atypical infection is considered less likely. No focal infiltrate. 2. No cause for symptoms in the abdomen or pelvis. 3. Atherosclerotic changes in the aorta. Coronary artery calcifications. Electronically Signed   By: Gerome Samavid  Williams III M.D   On: 06/09/2017 19:00    Procedures Procedures (including critical care time)  Medications Ordered in ED Medications  0.9 %  sodium chloride infusion (75 mL/hr Intravenous New Bag/Given 06/09/17 1749)  sodium chloride 0.9 % bolus 500 mL (0 mLs Intravenous Stopped 06/09/17 1914)  iopamidol (ISOVUE-300) 61  % injection 100 mL (100 mLs Intravenous Contrast Given 06/09/17 1816)     Initial Impression / Assessment and Plan / ED Course  I have reviewed the triage vital signs and the nursing notes.  Pertinent labs & imaging results that were available during my care of the patient were reviewed by me and considered in my medical decision making (see chart for details).    Is extensive workup performed here today looking for possible source of infection.  CT head shows no sinus infection.  CT soft tissue neck shows no abscess or masses in the neck region.  When had repeated CT of the chest also no acute findings.  CT abdomen and pelvis was negative no evidence of any liver abscess.  Patient with persistent leukocytosis around 13,000.  No fevers here.  The patient has been taking Aleve twice a day.  Patient will need additional workup by her primary care doctor also would consider echocardiogram to rule out endocarditis.  2 sets of blood cultures done here today.  Also may want to consider consultation by infectious disease or rheumatology.  Patient's lactic acid was normal.  Patient at times has some tachycardia but most recent heart rate was 97 blood pressures have been fine.  Patient afebrile here   Final Clinical Impressions(s) / ED Diagnoses   Final diagnoses:  Fever, unspecified fever cause  Myalgia    ED Discharge Orders    None       Vanetta MuldersZackowski, Torrian Canion, MD 06/09/17 1942

## 2017-06-09 NOTE — ED Triage Notes (Signed)
Pt reports she has been sick since 11/9 had z-pack, labwork. Pt reports she had chest CT and head xray yesterday. Pt reports she has elevated WBC count and fever/chills. Pt reports continued pain to right side of head behind right ear and jaw, sore throat and stiffness in neck. Pt also reports fatigue.

## 2017-06-09 NOTE — Discharge Instructions (Signed)
Today's workup to include CT head soft tissue neck chest repeat and CT abdomen and pelvis without any specific findings.  Labs without significant abnormalities other than that the white blood cell count is still in the 13,000 range.  Recommend that you call your primary care doctor on Monday for additional follow-up.  Considerations could include echocardiogram to rule out infection on the heart valves.  And follow-up with a rheumatologist.

## 2017-06-10 ENCOUNTER — Encounter: Payer: Self-pay | Admitting: Family Medicine

## 2017-06-11 ENCOUNTER — Encounter: Payer: Self-pay | Admitting: Family Medicine

## 2017-06-11 ENCOUNTER — Telehealth: Payer: Self-pay | Admitting: Family Medicine

## 2017-06-11 ENCOUNTER — Emergency Department (HOSPITAL_COMMUNITY)
Admission: EM | Admit: 2017-06-11 | Discharge: 2017-06-11 | Disposition: A | Discharge status: Home / Self Care | Payer: PPO | Attending: Emergency Medicine | Admitting: Emergency Medicine

## 2017-06-11 ENCOUNTER — Encounter (HOSPITAL_COMMUNITY): Payer: Self-pay | Admitting: Cardiology

## 2017-06-11 ENCOUNTER — Ambulatory Visit (INDEPENDENT_AMBULATORY_CARE_PROVIDER_SITE_OTHER): Payer: PPO | Admitting: Family Medicine

## 2017-06-11 VITALS — BP 140/80 | HR 101 | Resp 16 | Ht 60.0 in | Wt 146.0 lb

## 2017-06-11 DIAGNOSIS — Z79899 Other long term (current) drug therapy: Secondary | ICD-10-CM | POA: Insufficient documentation

## 2017-06-11 DIAGNOSIS — E785 Hyperlipidemia, unspecified: Secondary | ICD-10-CM | POA: Insufficient documentation

## 2017-06-11 DIAGNOSIS — R52 Pain, unspecified: Secondary | ICD-10-CM | POA: Insufficient documentation

## 2017-06-11 DIAGNOSIS — M2550 Pain in unspecified joint: Secondary | ICD-10-CM | POA: Diagnosis not present

## 2017-06-11 DIAGNOSIS — R531 Weakness: Secondary | ICD-10-CM

## 2017-06-11 DIAGNOSIS — R700 Elevated erythrocyte sedimentation rate: Secondary | ICD-10-CM | POA: Diagnosis not present

## 2017-06-11 DIAGNOSIS — Z7982 Long term (current) use of aspirin: Secondary | ICD-10-CM | POA: Diagnosis not present

## 2017-06-11 DIAGNOSIS — I5032 Chronic diastolic (congestive) heart failure: Secondary | ICD-10-CM | POA: Diagnosis not present

## 2017-06-11 DIAGNOSIS — E119 Type 2 diabetes mellitus without complications: Secondary | ICD-10-CM | POA: Diagnosis not present

## 2017-06-11 HISTORY — DX: Meniere's disease, unspecified ear: H81.09

## 2017-06-11 LAB — CBC WITH DIFFERENTIAL/PLATELET
Basophils Absolute: 0 10*3/uL (ref 0.0–0.1)
Basophils Relative: 0 %
Eosinophils Absolute: 0 10*3/uL (ref 0.0–0.7)
Eosinophils Relative: 0 %
HEMATOCRIT: 36.6 % (ref 36.0–46.0)
Hemoglobin: 11.7 g/dL — ABNORMAL LOW (ref 12.0–15.0)
LYMPHS ABS: 1.9 10*3/uL (ref 0.7–4.0)
LYMPHS PCT: 18 %
MCH: 29.3 pg (ref 26.0–34.0)
MCHC: 32 g/dL (ref 30.0–36.0)
MCV: 91.5 fL (ref 78.0–100.0)
Monocytes Absolute: 1 10*3/uL (ref 0.1–1.0)
Monocytes Relative: 9 %
NEUTROS ABS: 7.7 10*3/uL (ref 1.7–7.7)
Neutrophils Relative %: 73 %
PLATELETS: 282 10*3/uL (ref 150–400)
RBC: 4 MIL/uL (ref 3.87–5.11)
RDW: 12.6 % (ref 11.5–15.5)
WBC: 10.6 10*3/uL — AB (ref 4.0–10.5)

## 2017-06-11 LAB — INFLUENZA PANEL BY PCR (TYPE A & B)
Influenza A By PCR: NEGATIVE
Influenza B By PCR: NEGATIVE

## 2017-06-11 LAB — BASIC METABOLIC PANEL
ANION GAP: 11 (ref 5–15)
BUN: 16 mg/dL (ref 6–20)
CHLORIDE: 99 mmol/L — AB (ref 101–111)
CO2: 27 mmol/L (ref 22–32)
Calcium: 9.2 mg/dL (ref 8.9–10.3)
Creatinine, Ser: 0.66 mg/dL (ref 0.44–1.00)
GFR calc Af Amer: >60 mL/min (ref >60)
GFR calc non Af Amer: >60 mL/min (ref >60)
GLUCOSE: 114 mg/dL — AB (ref 65–99)
POTASSIUM: 3.9 mmol/L (ref 3.5–5.1)
Sodium: 137 mmol/L (ref 135–145)

## 2017-06-11 LAB — URINALYSIS, ROUTINE W REFLEX MICROSCOPIC
BACTERIA UA: NONE SEEN
BILIRUBIN URINE: NEGATIVE
GLUCOSE, UA: NEGATIVE mg/dL
HGB URINE DIPSTICK: NEGATIVE
KETONES UR: 20 mg/dL — AB
Nitrite: NEGATIVE
PH: 7 (ref 5.0–8.0)
PROTEIN: 30 mg/dL — AB
Specific Gravity, Urine: 1.02 (ref 1.005–1.030)

## 2017-06-11 LAB — SEDIMENTATION RATE: Sed Rate: 123 mm/hr — ABNORMAL HIGH (ref 0–22)

## 2017-06-11 LAB — CK: Total CK: 22 U/L — ABNORMAL LOW (ref 38–234)

## 2017-06-11 MED ORDER — TRAMADOL HCL 50 MG PO TABS: 50.0000 mg | ORAL_TABLET | Freq: Four times a day (QID) | ORAL | 0 refills | Status: DC | PRN

## 2017-06-11 MED ORDER — MORPHINE SULFATE (PF) 4 MG/ML IV SOLN
4.0000 mg | Freq: Once | INTRAVENOUS | Status: AC
  Administered 2017-06-11: 4 mg via INTRAVENOUS
  Filled 2017-06-11: qty 1

## 2017-06-11 NOTE — Assessment & Plan Note (Signed)
Elevated ESR noted and repeat test in the ED on day of the visit is even higher, needs rheumatology evaluation as underlying rheumatology disorder is a possibility 

## 2017-06-11 NOTE — Telephone Encounter (Signed)
Pt coming in now for appt

## 2017-06-11 NOTE — Telephone Encounter (Signed)
notyed and she is seen

## 2017-06-11 NOTE — ED Notes (Signed)
Pt is complaining of excruciating pain all over.

## 2017-06-11 NOTE — ED Notes (Signed)
MD at bedside. 

## 2017-06-11 NOTE — Progress Notes (Signed)
   Tracy Humphrey     MRN: [id number]      DOB: 15-Feb-1951   HPI Tracy Humphrey is here for follow up of ED visit this past weekend, states she is experiencing generalized pain rated at a 10,she is crying in pain worse on the right side of head, neck and jaw, hurts to move her upper extremities, all movement, especially initiating movement is extremely difficult. Bathing this morning was very difficult for her. Her level of function and ability to care for herself has significantly deteriorated since she was evaluated by me 4 days ago. She attributes her pain symptoms to levaquin and though she took the medication yesterday, vows she will not take another tablet, which I agree with. Of note, she has complained of head , and neck pain and also.  has  back pain and stiffness before starting levaquin, however, today, her symptoms are much worse She was in the ED 2 days ago had additional scans and labs done , CT neck showed arthritis and her WBC was still mildly elevated. She states that she was advised sahe may need an echocardiogram of her heart to see if she has infected heart valves  ROS  See HPI  Denies abdominal pain, nausea, vomiting,diarrhea or constipation.  C/o  joint pain, swelling and limitation in mobility. Denies headaches, seizures, numbness, or tingling. . Denies skin break down or rash.   PE  BP 140/80   Pulse (!) 101   Resp 16   Ht 5' (1.524 m)   Wt 146 lb (66.2 kg)   SpO2 97%   BMI 28.51 kg/m   Patient alert and oriented and in no cardiopulmonary distress.Pt in pain and ill appearing, patient experiencing pain with breathing and sharp episodes of pain while sitting. Incapable of walking without pain, taken in a wheelchair to the Ed, all movement is extremely painful HEENT: No facial asymmetry, EOMI,   oropharynx pink and moist.  Neck decreased ROM no JVD, no mass.  Chest: Clear to auscultation bilaterally.  CVS: S1, S2 no murmurs, no S3.Regular rate.  ABD: Soft non  tender.   Ext: No edema  MS: decreased  ROM spine, shoulders, hips and knees.  Skin: Intact, no ulcerations or rash noted.  Psych: Good eye contact, normal affect. Memory intact not anxious or depressed appearing.  CNS: CN 2-12 intact,  Assessment & Plan  Generalized pain Acute onset of severe debilitating pain affecting both upper  And  Lower extremities.  affecting both bones and muscles, etiology is unclear, symptoms were preceded by intermittent febrile illness ,and per pt history , precipitated with use of levaquin over the past 4 days Incapable of personal care  Due to pain , ALSO NEEDS a DIagnosis made so that she can be treated correctly and recover.. She is directed to the ED and the case is discussed with the ED Physician  Elevated sedimentation rate Elevated ESR noted and repeat test in the ED on day of the visit is even higher, needs rheumatology evaluation as underlying rheumatology disorder is a possibility

## 2017-06-11 NOTE — Assessment & Plan Note (Signed)
Acute onset of severe debilitating pain affecting both upper  And  Lower extremities.  affecting both bones and muscles, etiology is unclear, symptoms were preceded by intermittent febrile illness ,and per pt history , precipitated with use of levaquin over the past 4 days Incapable of personal care  Due to pain , ALSO NEEDS a DIagnosis made so that she can be treated correctly and recover.. She is directed to the ED and the case is discussed with the ED Physician

## 2017-06-11 NOTE — ED Notes (Signed)
Have given blood work to Water quality scientistphlebotomist

## 2017-06-11 NOTE — Discharge Instructions (Signed)
Take Aleve for mild pain or the pain medicine prescribed for bad pain.  Call Dr. Lodema HongSimpson tomorrow for follow-up.  She will arrange for you to see a rheumatologist.  Return to the emergency department if your symptoms worsen or if concern for any reason

## 2017-06-11 NOTE — ED Provider Notes (Addendum)
[facility] Provider Note   CSN: [id number] Arrival date & time: 06/11/17  1111     History   Chief Complaint Chief Complaint  Patient presents with  . Generalized Body Aches  History is obtained from Dr. Lodema HongSimpson by telephone from patient and from her husband  HPI Tracy Humphrey is a 66 y.o. female.  HPI complains of pain behind left ear and pain in all 4 extremities right upper extremity and right lower extremity greater than left upper and left lower extremities onset 5 days ago to the point where she cannot get up out of bed without exquisite pain.  She is taken Aleve with transient relief.  She reported fever and chills with "cold symptoms" approximately 10 days ago, started on Z-Pak by Dr. Lodema HongSimpson which she completed.  She was subsequently started on Levaquin 4 days ago which was stopped today when her symptoms became worse.  Cold symptoms have resolved.  She was seen in the emergency department here 06/09/2017, had CT scan of the chest showing scattered groundglass appearance favored to represent atelectasis versus subtle atypical infection, less likely no focal infiltrate CT abdomen and pelvis and head were normal.  CT scan of soft tissue neck showed severe right neuroforaminal narrowing no acute process.  Dr. Lodema HongSimpson reports to me she is unclear of cause of patient's symptoms.  She suggest possible rheumatology consult for infectious disease consult Past Medical History:  Diagnosis Date  . Allergy    prrenial  . BPV (benign positional vertigo) 11/04/2011  . GERD (gastroesophageal reflux disease)   . Helicobacter pylori ab+ 2014   treated by GI  . Hyperlipidemia 2013  . IGT (impaired glucose tolerance) 2013  . Mild diastolic dysfunction 06/2013   grade 2  . Obesity 2007    Prior to this had no weight issue   . Superficial thrombophlebitis 2015  . Vertigo     Patient Active Problem List   Diagnosis Date Noted  . Pleuritic chest pain 06/07/2017  .  Elevated sedimentation rate 06/07/2017  . Arthritis 06/07/2017  . Cough 06/07/2017  . Seasonal allergies 11/13/2013  . Obesity (BMI 30.0-34.9) 06/15/2013  . Diabetes mellitus type 2, diet-controlled (HCC) 10/19/2011  . Dyslipidemia, goal LDL below 100 10/19/2011  . Metabolic syndrome X 10/19/2011  . VERTIGO 05/25/2010    Past Surgical History:  Procedure Laterality Date  . BREAST BIOPSY Right    cyst  . CATARACT EXTRACTION W/PHACO Left 07/21/2014   Procedure: CATARACT EXTRACTION PHACO AND INTRAOCULAR LENS PLACEMENT (IOC);  Surgeon: Loraine LericheMark T. Nile RiggsShapiro, MD;  Location: [facility];  Service: Ophthalmology;  Laterality: Left;  CDE 3.91  . CATARACT EXTRACTION W/PHACO Right 08/04/2014   Procedure: CATARACT EXTRACTION PHACO AND INTRAOCULAR LENS PLACEMENT; CDE:  5.38;  Surgeon: Loraine LericheMark T. Nile RiggsShapiro, MD;  Location: [facility];  Service: Ophthalmology;  Laterality: Right;  . CESAREAN SECTION  29 years ago   . COLONOSCOPY  02/13/2005   Dr. Jena Gaussourk- normal rectum, normal colon  . ESOPHAGOGASTRODUODENOSCOPY N/A 06/26/2013   Procedure: ESOPHAGOGASTRODUODENOSCOPY (EGD);  Surgeon: Corbin Adeobert M Rourk, MD;  Location: [facility] SUITE;  Service: Endoscopy;  Laterality: N/A;  8:00  . EYE SURGERY Bilateral 2015   cataract extraction  . Right foot surgery 2003. 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 Authorizing Provider  aspirin (ASPIRIN LOW DOSE) 81 MG EC tablet Take 81 mg by mouth daily.     Yes [provider]  azelastine (ASTELIN) 0.1 % nasal spray Place 2 sprays into both nostrils 2 (two) times daily. Use in each nostril as directed 07/13/15 06/11/17 Yes Kerri Perches, MD  benzonatate (TESSALON) 100 MG capsule Take 1 capsule (100 mg total) by mouth 2 (two) times daily as needed for cough. 06/07/17  Yes Kerri Perches, MD  calcium  carbonate (TUMS - DOSED IN MG ELEMENTAL CALCIUM) 500 MG chewable tablet Chew 2 tablets by mouth daily.     Yes [provider]  Isopropyl Alcohol (SWIMMERS EAR DROPS) 95 % LIQD Place 1-4 drops into both ears daily as needed.   Yes [provider]  levofloxacin (LEVAQUIN) 500 MG tablet Take 1 tablet (500 mg total) by mouth daily. 06/07/17  Yes Kerri Perches, MD  loratadine (CLARITIN) 10 MG tablet Take 1 tablet (10 mg total) by mouth daily. 02/03/16  Yes Kerri Perches, MD  Multiple Vitamin (TAB-A-VITE) TABS 1 tablet daily Patient taking differently: Take 1 tablet by mouth daily. 1 tablet daily 10/26/15  Yes Kerri Perches, MD  naproxen sodium (ALEVE) 220 MG tablet Take 220 mg by mouth 2 (two) times daily as needed.   Yes [provider]  pseudoephedrine (SUDAFED) 30 MG tablet Take 30 mg by mouth daily.   Yes [provider]  azithromycin (ZITHROMAX) 250 MG tablet Two tablets on day one , then one tablet once daily for an additional four days Patient not taking: Reported on 06/09/2017 05/24/17   Kerri Perches, MD    Family History Family History  Problem Relation Age of Onset  . Dementia Mother 53  . Hypertension Mother   . Hypertension Father   . Heart disease Father   . Diabetes Father   . AAA (abdominal aortic aneurysm) Father   . Hypertension Sister   . Coronary artery disease Sister 38  . Hypertension Brother 86  . Alcohol abuse Brother   . AAA (abdominal aortic aneurysm) Paternal Grandfather     Social History Social History   Tobacco Use  . Smoking status: Never Smoker  . Smokeless tobacco: Never Used  . Tobacco comment: Never smoke  Substance Use Topics  . Alcohol use: No  . Drug use: No     Allergies   Levaquin [levofloxacin in d5w]; Prednisone; and Penicillins   Review of Systems Review of Systems  Musculoskeletal: Positive for arthralgias and myalgias.     Physical Exam Updated Vital Signs BP 112/71  (BP Location: Left Arm)   Pulse 99   Temp 98.7 F (37.1 C) (Oral)   Resp 15   Ht 5' (1.524 m)   Wt 64.9 kg (143 lb)   SpO2 100%   BMI 27.93 kg/m   Physical Exam  Constitutional: She is oriented to person, place, and time. She appears distressed.  Appears uncomfortable Glasgow Coma Score 15  HENT:  Head: Normocephalic and atraumatic.  Eyes: Conjunctivae are normal. Pupils are equal, round, and reactive to light.  Neck: Neck supple. No tracheal deviation present. No thyromegaly present.  Cardiovascular: Normal rate and regular rhythm.  No murmur heard. Pulmonary/Chest: Effort normal and breath sounds normal.  Abdominal: Soft. Bowel sounds are normal. She exhibits no distension. There is no tenderness.  Musculoskeletal: Normal range of motion. She exhibits no edema or tenderness.  All 4 extremities without  redness swelling or tenderness neurovascularly intact  Neurological: She is alert and oriented to person, place, and time. Coordination normal.  Skin: Skin is warm and dry. No rash noted.  Psychiatric: She has a normal mood and affect.  Nursing note and vitals reviewed.    ED Treatments / Results  Labs (all labs ordered are listed, but only abnormal results are displayed) Labs Reviewed - No data to display  EKG  EKG Interpretation None      Results for orders placed or performed during the hospital encounter of 06/11/17  CK  Result Value Ref Range   Total CK 22 (L) 38 - 234 U/L  Sedimentation rate  Result Value Ref Range   Sed Rate 123 (H) 0 - 22 mm/hr  Basic metabolic panel  Result Value Ref Range   Sodium 137 135 - 145 mmol/L   Potassium 3.9 3.5 - 5.1 mmol/L   Chloride 99 (L) 101 - 111 mmol/L   CO2 27 22 - 32 mmol/L   Glucose, Bld 114 (H) 65 - 99 mg/dL   BUN 16 6 - 20 mg/dL   Creatinine, Ser 4.09 0.44 - 1.00 mg/dL   Calcium 9.2 8.9 - 81.1 mg/dL   GFR calc non Af Amer >60 >60 mL/min   GFR calc Af Amer >60 >60 mL/min   Anion gap 11 5 - 15  CBC with  Differential/Platelet  Result Value Ref Range   WBC 10.6 (H) 4.0 - 10.5 K/uL   RBC 4.00 3.87 - 5.11 MIL/uL   Hemoglobin 11.7 (L) 12.0 - 15.0 g/dL   HCT 91.4 78.2 - 95.6 %   MCV 91.5 78.0 - 100.0 fL   MCH 29.3 26.0 - 34.0 pg   MCHC 32.0 30.0 - 36.0 g/dL   RDW 21.3 08.6 - 57.8 %   Platelets 282 150 - 400 K/uL   Neutrophils Relative % 73 %   Neutro Abs 7.7 1.7 - 7.7 K/uL   Lymphocytes Relative 18 %   Lymphs Abs 1.9 0.7 - 4.0 K/uL   Monocytes Relative 9 %   Monocytes Absolute 1.0 0.1 - 1.0 K/uL   Eosinophils Relative 0 %   Eosinophils Absolute 0.0 0.0 - 0.7 K/uL   Basophils Relative 0 %   Basophils Absolute 0.0 0.0 - 0.1 K/uL  Urinalysis, Routine w reflex microscopic  Result Value Ref Range   Color, Urine YELLOW YELLOW   APPearance HAZY (A) CLEAR   Specific Gravity, Urine 1.020 1.005 - 1.030   pH 7.0 5.0 - 8.0   Glucose, UA NEGATIVE NEGATIVE mg/dL   Hgb urine dipstick NEGATIVE NEGATIVE   Bilirubin Urine NEGATIVE NEGATIVE   Ketones, ur 20 (A) NEGATIVE mg/dL   Protein, ur 30 (A) NEGATIVE mg/dL   Nitrite NEGATIVE NEGATIVE   Leukocytes, UA SMALL (A) NEGATIVE   RBC / HPF 0-5 0 - 5 RBC/hpf   WBC, UA 6-30 0 - 5 WBC/hpf   Bacteria, UA NONE SEEN NONE SEEN   Squamous Epithelial / LPF 0-5 (A) NONE SEEN   Mucus PRESENT   Influenza panel by PCR (type A & B)  Result Value Ref Range   Influenza A By PCR NEGATIVE NEGATIVE   Influenza B By PCR NEGATIVE NEGATIVE   Dg Sinuses Complete  Result Date: 06/07/2017 CLINICAL DATA:  Sinus drainage for 3 weeks, cough. EXAM: PARANASAL SINUSES - COMPLETE 3 + VIEW COMPARISON:  None. FINDINGS: The paranasal sinus are aerated. There is no evidence of sinus opacification air-fluid levels or mucosal thickening.  Nasal septum mildly deviated to the RIGHT. No significant bone abnormalities are seen. IMPRESSION: Negative.  Please note, sinus CT is reference standard. Electronically Signed   By: Awilda Metro M.D.   On: 06/07/2017 00:03   Dg Chest 2  View  Result Date: 05/30/2017 CLINICAL DATA:  Congestion, fever, and chills beginning 1 week ago, finished antibiotics on Monday, start with dry cough and pain between shoulder blades when coughing or taking a deep breath, question pneumonia EXAM: CHEST  2 VIEW COMPARISON:  03/16/2004 FINDINGS: Normal heart size, mediastinal contours, and pulmonary vascularity. Linear subsegmental atelectasis in lingula. Lungs otherwise clear. No infiltrate, pleural effusion or pneumothorax. Multilevel endplate spur formation thoracic spine. IMPRESSION: Linear subsegmental atelectasis in lingula. Electronically Signed   By: Ulyses Southward M.D.   On: 05/30/2017 18:11   Ct Head Wo Contrast  Result Date: 06/09/2017 CLINICAL DATA:  Elevated white count with fever and chills, pain to the right side of head neck stiffness and fatigue EXAM: CT HEAD WITHOUT CONTRAST TECHNIQUE: Contiguous axial images were obtained from the base of the skull through the vertex without intravenous contrast. COMPARISON:  06/06/2017, MRI 06/22/2011 FINDINGS: Brain: No acute territorial infarction, hemorrhage or intracranial mass is visualized. Minimal small vessel ischemic changes of the white matter. Nonenlarged ventricles. Vascular: No hyperdense vessels.  No unexpected calcification Skull: Normal. Negative for fracture or focal lesion. Sinuses/Orbits: Mild mucosal thickening in the ethmoid sinuses. No acute orbital abnormality Other: None IMPRESSION: No CT evidence for acute intracranial abnormality. Electronically Signed   By: Jasmine Pang M.D.   On: 06/09/2017 18:43   Ct Soft Tissue Neck W Contrast  Result Date: 06/09/2017 CLINICAL DATA:  Leukocytosis, fever and chills. Feeling sick since May 18, 2017. Completed Z-Pak. RIGHT retroauricular and jaw pain. Sore throat and neck stiffness. EXAM: CT NECK WITH CONTRAST TECHNIQUE: Multidetector CT imaging of the neck was performed using the standard protocol following the bolus administration of  intravenous contrast. CONTRAST:  ISOVUE-300 IOPAMIDOL (ISOVUE-300) INJECTION 61% COMPARISON:  CT chest June 07, 2017 FINDINGS: PHARYNX AND LARYNX: Normal.  Widely patent airway. SALIVARY GLANDS: Normal. THYROID: Normal. LYMPH NODES: No lymphadenopathy by CT size criteria. VASCULAR: Mild calcific atherosclerosis carotid bifurcations. LIMITED INTRACRANIAL: Normal. VISUALIZED ORBITS: Normal. MASTOIDS AND VISUALIZED PARANASAL SINUSES: Status post bilateral ocular lens implants. Paranasal sinuses and mastoid air cells are well aerated. SKELETON: Grade 1 C4-5 anterolisthesis without spondylolysis. Severe LEFT upper cervical facet arthropathy. Moderate to severe C5-6 and C6-7 degenerative discs. Severe LEFT C4-5, severe RIGHT C5-6 neural foraminal narrowing. UPPER CHEST: Lung apices are clear. No superior mediastinal lymphadenopathy. OTHER: None. IMPRESSION: 1. No acute process in the neck. 2. Severe LEFT C4-5 and severe RIGHT C5-6 neural foraminal narrowing. Electronically Signed   By: Awilda Metro M.D.   On: 06/09/2017 18:50   Ct Chest Wo Contrast  Result Date: 06/07/2017 CLINICAL DATA:  Chest pain between the shoulder blades.  Cough. EXAM: CT CHEST WITHOUT CONTRAST TECHNIQUE: Multidetector CT imaging of the chest was performed following the standard protocol without IV contrast. COMPARISON:  None. FINDINGS: Cardiovascular: No significant vascular findings. Normal heart size. No pericardial effusion. Coronary artery atherosclerosis in the LAD. Mediastinum/Nodes: No enlarged mediastinal or axillary lymph nodes. Thyroid gland, trachea, and esophagus demonstrate no significant findings. Lungs/Pleura: Lungs are clear. No pleural effusion or pneumothorax. Upper Abdomen: Small hiatal hernia. No acute abnormality in the upper abdomen. Musculoskeletal: No acute osseous abnormality. No aggressive osseous lesion. Anterior bridging osteophytes of the mid and lower thoracic spine as  can be seen with diffuse  idiopathic skeletal hyperostosis. IMPRESSION: 1. No active cardiopulmonary disease. 2. Mild coronary artery atherosclerosis in the LAD. Electronically Signed   By: Elige Ko   On: 06/07/2017 10:32   Ct Chest W Contrast  Result Date: 06/09/2017 CLINICAL DATA:  High white blood cell count with fever and chills. Patient recently treated with Z-Pak. EXAM: CT CHEST, ABDOMEN, AND PELVIS WITH CONTRAST TECHNIQUE: Multidetector CT imaging of the chest, abdomen and pelvis was performed following the standard protocol during bolus administration of intravenous contrast. CONTRAST:  ISOVUE-300 IOPAMIDOL (ISOVUE-300) INJECTION 61% COMPARISON:  CT of the chest June 07, 2017 FINDINGS: CT CHEST FINDINGS Cardiovascular: The heart is unchanged. Coronary artery calcifications. The thoracic aorta demonstrates no aneurysm or dissection. The central pulmonary artery is normal in caliber. No obvious filling defects on limited images. Mediastinum/Nodes: Small thyroid nodules. The esophagus is normal. No adenopathy in the chest. No effusions. Lungs/Pleura: Central airways are normal. No pneumothorax. No nodule or mass. No focal infiltrate. Mild scattered ground-glass. I favor regions of subsegmental atelectasis and respiratory motion. Subtle atypical infection considered less likely. Musculoskeletal: See below CT ABDOMEN PELVIS FINDINGS Hepatobiliary: 1 or 2 probable tiny cysts are seen in the liver but too small to characterize. No suspicious liver mass. The gallbladder, visualized common bile duct, and visualized portal vein are normal. Pancreas: Unremarkable. No pancreatic ductal dilatation or surrounding inflammatory changes. Spleen: Normal in size without focal abnormality. Adrenals/Urinary Tract: Adrenal glands are normal. The kidneys demonstrate no stones or suspicious masses. There is a small cyst in the lower left kidney. No hydronephrosis or perinephric stranding. No ureteral stones. The bladder is unremarkable.  Stomach/Bowel: The stomach and small bowel are normal. Scattered colonic diverticuli are seen without diverticulitis. The appendix is unremarkable. Vascular/Lymphatic: Atherosclerotic changes are seen in the nonaneurysmal aorta and iliac vessels. No adenopathy. Reproductive: Uterus and bilateral adnexa are unremarkable. Other: No free air or free fluid. Musculoskeletal: No acute or significant osseous findings. IMPRESSION: 1. Mild scattered ground-glass in the chest is favored to represent scattered subsegmental atelectasis. A subtle atypical infection is considered less likely. No focal infiltrate. 2. No cause for symptoms in the abdomen or pelvis. 3. Atherosclerotic changes in the aorta. Coronary artery calcifications. Electronically Signed   By: Gerome Erle Guster III M.D   On: 06/09/2017 19:00   Ct Abdomen Pelvis W Contrast  Result Date: 06/09/2017 CLINICAL DATA:  High white blood cell count with fever and chills. Patient recently treated with Z-Pak. EXAM: CT CHEST, ABDOMEN, AND PELVIS WITH CONTRAST TECHNIQUE: Multidetector CT imaging of the chest, abdomen and pelvis was performed following the standard protocol during bolus administration of intravenous contrast. CONTRAST:  ISOVUE-300 IOPAMIDOL (ISOVUE-300) INJECTION 61% COMPARISON:  CT of the chest June 07, 2017 FINDINGS: CT CHEST FINDINGS Cardiovascular: The heart is unchanged. Coronary artery calcifications. The thoracic aorta demonstrates no aneurysm or dissection. The central pulmonary artery is normal in caliber. No obvious filling defects on limited images. Mediastinum/Nodes: Small thyroid nodules. The esophagus is normal. No adenopathy in the chest. No effusions. Lungs/Pleura: Central airways are normal. No pneumothorax. No nodule or mass. No focal infiltrate. Mild scattered ground-glass. I favor regions of subsegmental atelectasis and respiratory motion. Subtle atypical infection considered less likely. Musculoskeletal: See below CT ABDOMEN  PELVIS FINDINGS Hepatobiliary: 1 or 2 probable tiny cysts are seen in the liver but too small to characterize. No suspicious liver mass. The gallbladder, visualized common bile duct, and visualized portal vein are normal. Pancreas: Unremarkable.  No pancreatic ductal dilatation or surrounding inflammatory changes. Spleen: Normal in size without focal abnormality. Adrenals/Urinary Tract: Adrenal glands are normal. The kidneys demonstrate no stones or suspicious masses. There is a small cyst in the lower left kidney. No hydronephrosis or perinephric stranding. No ureteral stones. The bladder is unremarkable. Stomach/Bowel: The stomach and small bowel are normal. Scattered colonic diverticuli are seen without diverticulitis. The appendix is unremarkable. Vascular/Lymphatic: Atherosclerotic changes are seen in the nonaneurysmal aorta and iliac vessels. No adenopathy. Reproductive: Uterus and bilateral adnexa are unremarkable. Other: No free air or free fluid. Musculoskeletal: No acute or significant osseous findings. IMPRESSION: 1. Mild scattered ground-glass in the chest is favored to represent scattered subsegmental atelectasis. A subtle atypical infection is considered less likely. No focal infiltrate. 2. No cause for symptoms in the abdomen or pelvis. 3. Atherosclerotic changes in the aorta. Coronary artery calcifications. Electronically Signed   By: Gerome Samavid  Williams III M.D   On: 06/09/2017 19:00   Mm Screening Breast Tomo Bilateral  Result Date: 06/07/2017 CLINICAL DATA:  Screening. EXAM: 2D DIGITAL SCREENING BILATERAL MAMMOGRAM WITH CAD AND ADJUNCT TOMO COMPARISON:  Previous exam(s). ACR Breast Density Category b: There are scattered areas of fibroglandular density. FINDINGS: There are no findings suspicious for malignancy. Images were processed with CAD. IMPRESSION: No mammographic evidence of malignancy. A result letter of this screening mammogram will be mailed directly to the patient. RECOMMENDATION:  Screening mammogram in one year. (Code:SM-B-01Y) BI-RADS CATEGORY  1: Negative. Electronically Signed   By: Harmon PierJeffrey  Hu M.D.   On: 06/07/2017 15:07    Radiology Ct Head Wo Contrast  Result Date: 06/09/2017 CLINICAL DATA:  Elevated white count with fever and chills, pain to the right side of head neck stiffness and fatigue EXAM: CT HEAD WITHOUT CONTRAST TECHNIQUE: Contiguous axial images were obtained from the base of the skull through the vertex without intravenous contrast. COMPARISON:  06/06/2017, MRI 06/22/2011 FINDINGS: Brain: No acute territorial infarction, hemorrhage or intracranial mass is visualized. Minimal small vessel ischemic changes of the white matter. Nonenlarged ventricles. Vascular: No hyperdense vessels.  No unexpected calcification Skull: Normal. Negative for fracture or focal lesion. Sinuses/Orbits: Mild mucosal thickening in the ethmoid sinuses. No acute orbital abnormality Other: None IMPRESSION: No CT evidence for acute intracranial abnormality. Electronically Signed   By: Jasmine PangKim  Fujinaga M.D.   On: 06/09/2017 18:43   Ct Soft Tissue Neck W Contrast  Result Date: 06/09/2017 CLINICAL DATA:  Leukocytosis, fever and chills. Feeling sick since May 18, 2017. Completed Z-Pak. RIGHT retroauricular and jaw pain. Sore throat and neck stiffness. EXAM: CT NECK WITH CONTRAST TECHNIQUE: Multidetector CT imaging of the neck was performed using the standard protocol following the bolus administration of intravenous contrast. CONTRAST:  100mL ISOVUE-300 IOPAMIDOL (ISOVUE-300) INJECTION 61% COMPARISON:  CT chest June 07, 2017 FINDINGS: PHARYNX AND LARYNX: Normal.  Widely patent airway. SALIVARY GLANDS: Normal. THYROID: Normal. LYMPH NODES: No lymphadenopathy by CT size criteria. VASCULAR: Mild calcific atherosclerosis carotid bifurcations. LIMITED INTRACRANIAL: Normal. VISUALIZED ORBITS: Normal. MASTOIDS AND VISUALIZED PARANASAL SINUSES: Status post bilateral ocular lens implants. Paranasal  sinuses and mastoid air cells are well aerated. SKELETON: Grade 1 C4-5 anterolisthesis without spondylolysis. Severe LEFT upper cervical facet arthropathy. Moderate to severe C5-6 and C6-7 degenerative discs. Severe LEFT C4-5, severe RIGHT C5-6 neural foraminal narrowing. UPPER CHEST: Lung apices are clear. No superior mediastinal lymphadenopathy. OTHER: None. IMPRESSION: 1. No acute process in the neck. 2. Severe LEFT C4-5 and severe RIGHT C5-6 neural foraminal narrowing. Electronically Signed  By: Awilda Metro M.D.   On: 06/09/2017 18:50   Ct Chest W Contrast  Result Date: 06/09/2017 CLINICAL DATA:  High white blood cell count with fever and chills. Patient recently treated with Z-Pak. EXAM: CT CHEST, ABDOMEN, AND PELVIS WITH CONTRAST TECHNIQUE: Multidetector CT imaging of the chest, abdomen and pelvis was performed following the standard protocol during bolus administration of intravenous contrast. CONTRAST:  ISOVUE-300 IOPAMIDOL (ISOVUE-300) INJECTION 61% COMPARISON:  CT of the chest June 07, 2017 FINDINGS: CT CHEST FINDINGS Cardiovascular: The heart is unchanged. Coronary artery calcifications. The thoracic aorta demonstrates no aneurysm or dissection. The central pulmonary artery is normal in caliber. No obvious filling defects on limited images. Mediastinum/Nodes: Small thyroid nodules. The esophagus is normal. No adenopathy in the chest. No effusions. Lungs/Pleura: Central airways are normal. No pneumothorax. No nodule or mass. No focal infiltrate. Mild scattered ground-glass. I favor regions of subsegmental atelectasis and respiratory motion. Subtle atypical infection considered less likely. Musculoskeletal: See below CT ABDOMEN PELVIS FINDINGS Hepatobiliary: 1 or 2 probable tiny cysts are seen in the liver but too small to characterize. No suspicious liver mass. The gallbladder, visualized common bile duct, and visualized portal vein are normal. Pancreas: Unremarkable. No pancreatic  ductal dilatation or surrounding inflammatory changes. Spleen: Normal in size without focal abnormality. Adrenals/Urinary Tract: Adrenal glands are normal. The kidneys demonstrate no stones or suspicious masses. There is a small cyst in the lower left kidney. No hydronephrosis or perinephric stranding. No ureteral stones. The bladder is unremarkable. Stomach/Bowel: The stomach and small bowel are normal. Scattered colonic diverticuli are seen without diverticulitis. The appendix is unremarkable. Vascular/Lymphatic: Atherosclerotic changes are seen in the nonaneurysmal aorta and iliac vessels. No adenopathy. Reproductive: Uterus and bilateral adnexa are unremarkable. Other: No free air or free fluid. Musculoskeletal: No acute or significant osseous findings. IMPRESSION: 1. Mild scattered ground-glass in the chest is favored to represent scattered subsegmental atelectasis. A subtle atypical infection is considered less likely. No focal infiltrate. 2. No cause for symptoms in the abdomen or pelvis. 3. Atherosclerotic changes in the aorta. Coronary artery calcifications. Electronically Signed   By: Gerome Via Rosado III M.D   On: 06/09/2017 19:00   Ct Abdomen Pelvis W Contrast  Result Date: 06/09/2017 CLINICAL DATA:  High white blood cell count with fever and chills. Patient recently treated with Z-Pak. EXAM: CT CHEST, ABDOMEN, AND PELVIS WITH CONTRAST TECHNIQUE: Multidetector CT imaging of the chest, abdomen and pelvis was performed following the standard protocol during bolus administration of intravenous contrast. CONTRAST:  ISOVUE-300 IOPAMIDOL (ISOVUE-300) INJECTION 61% COMPARISON:  CT of the chest June 07, 2017 FINDINGS: CT CHEST FINDINGS Cardiovascular: The heart is unchanged. Coronary artery calcifications. The thoracic aorta demonstrates no aneurysm or dissection. The central pulmonary artery is normal in caliber. No obvious filling defects on limited images. Mediastinum/Nodes: Small thyroid  nodules. The esophagus is normal. No adenopathy in the chest. No effusions. Lungs/Pleura: Central airways are normal. No pneumothorax. No nodule or mass. No focal infiltrate. Mild scattered ground-glass. I favor regions of subsegmental atelectasis and respiratory motion. Subtle atypical infection considered less likely. Musculoskeletal: See below CT ABDOMEN PELVIS FINDINGS Hepatobiliary: 1 or 2 probable tiny cysts are seen in the liver but too small to characterize. No suspicious liver mass. The gallbladder, visualized common bile duct, and visualized portal vein are normal. Pancreas: Unremarkable. No pancreatic ductal dilatation or surrounding inflammatory changes. Spleen: Normal in size without focal abnormality. Adrenals/Urinary Tract: Adrenal glands are normal. The kidneys demonstrate no stones  or suspicious masses. There is a small cyst in the lower left kidney. No hydronephrosis or perinephric stranding. No ureteral stones. The bladder is unremarkable. Stomach/Bowel: The stomach and small bowel are normal. Scattered colonic diverticuli are seen without diverticulitis. The appendix is unremarkable. Vascular/Lymphatic: Atherosclerotic changes are seen in the nonaneurysmal aorta and iliac vessels. No adenopathy. Reproductive: Uterus and bilateral adnexa are unremarkable. Other: No free air or free fluid. Musculoskeletal: No acute or significant osseous findings. IMPRESSION: 1. Mild scattered ground-glass in the chest is favored to represent scattered subsegmental atelectasis. A subtle atypical infection is considered less likely. No focal infiltrate. 2. No cause for symptoms in the abdomen or pelvis. 3. Atherosclerotic changes in the aorta. Coronary artery calcifications. Electronically Signed   By: Gerome Kelii Chittum III M.D   On: 06/09/2017 19:00    Procedures Procedures (including critical care time)  Medications Ordered in ED Medications  morphine 4 MG/ML injection 4 mg (not administered)     Initial  Impression / Assessment and Plan / ED Course  I have reviewed the triage vital signs and the nursing notes.  Pertinent labs & imaging results that were available during my care of the patient were reviewed by me and considered in my medical decision making (see chart for details).     4:15 PM pain improved after treatment with intravenous morphine 6:15 PM patient is alert and  ambulates without difficulty she feels improved..  I offered overnight hospitalization the patient and with Dr. Kevan Ny.  She and her husband declined.  Plan I will write prescription for tramadol.  She can also take Aleve as directed. I do not think there is any evidence of discitis or spinal infection with no midline neck pain and diffuse myalgias.  No fever.  She is invited to return to the emergency department if she feels worse.  Case was also discussed with Dr. Lodema Hong who will follow up with patient and arrange for rheumatologic follow-up.  Prescription tramadol .[place] Controlled Substance reporting System queried doubt UTI.  No urinary symptomsdoubt  uti . No urinary sx Final Clinical Impressions(s) / ED Diagnoses  dX diffuse arthralgias Final diagnoses:  None    ED Discharge Orders    None       Doug Sou, MD 06/11/17 Izola Price    Doug Sou, MD 06/11/17 1610    Doug Sou, MD 06/11/17 [phone]

## 2017-06-11 NOTE — Telephone Encounter (Signed)
Symptoms: Patient can barely get up and get to the bathroom and back, needing help in and out of bed, sharp head pains, behind ear into jaw. Feels like everything in her body is going to explode.   All the scans from hospital were clear and that's why the doctor at the ER said he was waiting for blood work.  Cancelled appt at orthopedic because she cant decipher where the pain is coming from because she hurts all over.  She states the ER doctor says she needs to see Dr.Simpson ASAP. CB#: [phone]

## 2017-06-11 NOTE — ED Triage Notes (Addendum)
Sent here from Dr. Anthony SarSimpson's office for abnormal labs.  C/O  all over body aches.

## 2017-06-11 NOTE — Consult Note (Signed)
ER Consultation   Tracy Humphrey WYS:[id number] DOB: May 18, 1951 DOA: 06/11/2017  PCP: Fayrene Helper, MD Consultants:  Gershon Crane - eye; [facility] Patient coming from: Home - lives with Tracy Humphrey; NOK: Tracy Humphrey, [phone]  Chief Complaint: weakness  HPI: Tracy Humphrey is a 66 y.o. female with medical history significant of diastolic dysfunction, HLD, GERD, and BPV presenting with weakness.  On Nov 8 pm she developed sore throat on the right side.  By the next AM, it was extremely painful.  She developed right head pain.  She thought maybe she had a right-sided sinus infection; she has a 3yo granddaughter and did not want to make her sick so that week, she saw Dr. Moshe Cipro.  Dr. Moshe Cipro ordered labs and gave her a Z-pack and told her not to walk her usual 3 miles a day.  She completed the Z-pack, went back to walking several days later and felt good.  She soon started noticing pain in her shoulder blades with cough, sneeze.  She called Dr. Moshe Cipro again and she ordered a sinus scan - negative.  She thought it was soreness initially.  Only 1 fever spike initially to 101 prior but otherwise afebrile.   She then started with sharp pains on the right head behind her ear and progressed along the right ear.  More blood work - elevated WBC count and ESR.  Concerning for bacterial infection and so ordered a chest CT - negative.    Started on Levaquin empirically on 11/29 - today is day 5.  The next day she started feeling jolts of pain shooting through her head, arms, legs, back.  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  It became harder for her to move up and down, difficulty getting into bed or out of bed, hard to roll over.  She ended up sitting in a recliner.  She still feels like lightning bolts are shooting through her.  Difficulty lying still or moving around.  She was told over the weekend to come in if she had any more fever.  Had "fever spikes"  Thursday-Sunday nightly - would wake up in a pool of sweat each night.  In the ER, Dr. Rogene Houston obtained blood cultures and recommended an echo for possible endocarditis and suggested that she return back to Dr. Moshe Cipro.  Pain was terrible again yesterday.  She started thinking that maybe it was due to the Posey.  She called Dr. Moshe Cipro this AM and was seen again today.  Dr. Moshe Cipro recommended admission due to her dramatic weakness, possibly due to Levaquin (last dose was yesterday about 2:30pm).  No nausea, headache.  Decreased PO intake.     ED Course:   Normal labs and studies.  Wants her admitted.  Dr. Moshe Cipro then came in and wanted the patient admitted.  She can arrange for outpatient rheumatology consult.  Review of Systems: As per HPI; otherwise review of systems reviewed and negative.   Ambulatory Status:   Ambulates without assistance  Past Medical History:  Diagnosis Date  . Allergy    prrenial  . BPV (benign positional vertigo) 11/04/2011  . GERD (gastroesophageal reflux disease)   . Helicobacter pylori ab+ 2014   treated by GI  . Hyperlipidemia 2013  . IGT (impaired glucose tolerance) 2013  . Meniere disease   . Mild diastolic dysfunction 23/3612   grade 2  . Obesity 2007    Prior to this had no weight issue   . Superficial  thrombophlebitis 2015  . Vertigo     Past Surgical History:  Procedure Laterality Date  . BREAST BIOPSY Right    cyst  . CATARACT EXTRACTION W/PHACO Left 07/21/2014   Procedure: CATARACT EXTRACTION PHACO AND INTRAOCULAR LENS PLACEMENT (IOC);  Surgeon: Elta Guadeloupe T. Gershon Crane, MD;  Location: [facility];  Service: Ophthalmology;  Laterality: Left;  CDE 3.91  . CATARACT EXTRACTION W/PHACO Right 08/04/2014   Procedure: CATARACT EXTRACTION PHACO AND INTRAOCULAR LENS PLACEMENT; CDE:  5.38;  Surgeon: Elta Guadeloupe T. Gershon Crane, MD;  Location: [facility];  Service: Ophthalmology;  Laterality: Right;  . CESAREAN SECTION  29 years ago   . COLONOSCOPY  02/13/2005   Dr. Gala Romney- normal  rectum, normal colon  . ESOPHAGOGASTRODUODENOSCOPY N/A 06/26/2013   Procedure: ESOPHAGOGASTRODUODENOSCOPY (EGD);  Surgeon: Daneil Dolin, MD;  Location: [facility];  Service: Endoscopy;  Laterality: N/A;  8:00  . EYE SURGERY Bilateral 2015   cataract extraction  . Right foot surgery 2003. 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 Marital status: Married    Spouse name: Not on file  . Number of children: Not on file  . Years of education: Not on file  . Highest education level: Not on file  Social Needs  . Financial resource strain: Not on file  . Food insecurity - worry: Not on file  . Food insecurity - inability: Not on file  . Transportation needs - medical: Not on file  . Transportation needs - non-medical: Not on file  Occupational History  . Occupation: [employer]   Tobacco Use  . Smoking status: Never Smoker  . Smokeless tobacco: Never Used  . Tobacco comment: Never smoke  Substance and Sexual Activity  . Alcohol use: No  . Drug use: No  . Sexual activity: Yes    Birth control/protection: Post-menopausal  Other Topics Concern  . Not on file  Social History Narrative  . Not on file    Allergies  Allergen Reactions  . Levaquin [Levofloxacin In D5w] Anaphylaxis    Generalized pain  . Prednisone     All steroids  . Penicillins Rash    .Has patient had a PCN reaction causing immediate rash, facial/tongue/throat swelling, SOB or lightheadedness with hypotension: Yes Has patient had a PCN reaction causing severe rash involving mucus membranes or skin necrosis: No Has patient had a PCN reaction that required hospitalization: No Has patient had a PCN reaction occurring within the last 10 years: No If all of the above answers are "NO", then may proceed with Cephalosporin use.     Family History  Problem Relation Age of Onset  . Dementia Mother 65  . Hypertension Mother   . Hypertension  Father   . Heart disease Father   . Diabetes Father   . AAA (abdominal aortic aneurysm) Father   . Hypertension Sister   . Coronary artery disease Sister 38  . Hypertension Brother 20  . Alcohol abuse Brother   . AAA (abdominal aortic aneurysm) Paternal Grandfather     Prior to Admission medications   Medication Sig Start Date End Date Taking? Authorizing Provider  aspirin (ASPIRIN LOW DOSE) 81 MG EC tablet Take 81 mg by mouth daily.     Yes [provider]  azelastine (ASTELIN) 0.1 % nasal spray Place 2 sprays into both nostrils 2 (two) times daily. Use in each nostril as directed 07/13/15 06/11/17 Yes Fayrene Helper,  MD  benzonatate (TESSALON) 100 MG capsule Take 1 capsule (100 mg total) by mouth 2 (two) times daily as needed for cough. 06/07/17  Yes Fayrene Helper, MD  calcium carbonate (TUMS - DOSED IN MG ELEMENTAL CALCIUM) 500 MG chewable tablet Chew 2 tablets by mouth daily.     Yes [provider]  Isopropyl Alcohol (SWIMMERS EAR DROPS) 95 % LIQD Place 1-4 drops into both ears daily as needed.   Yes [provider]  levofloxacin (LEVAQUIN) 500 MG tablet Take 1 tablet (500 mg total) by mouth daily. 06/07/17  Yes Fayrene Helper, MD  loratadine (CLARITIN) 10 MG tablet Take 1 tablet (10 mg total) by mouth daily. 02/03/16  Yes Fayrene Helper, MD  Multiple Vitamin (TAB-A-VITE) TABS 1 tablet daily Patient taking differently: Take 1 tablet by mouth daily. 1 tablet daily 10/26/15  Yes Fayrene Helper, MD  naproxen sodium (ALEVE) 220 MG tablet Take 220 mg by mouth 2 (two) times daily as needed.   Yes [provider]  pseudoephedrine (SUDAFED) 30 MG tablet Take 30 mg by mouth daily.   Yes [provider]  azithromycin (ZITHROMAX) 250 MG tablet Two tablets on day one , then one tablet once daily for an additional four days Patient not taking: Reported on 06/09/2017 05/24/17   Fayrene Helper, MD    Physical Exam: Vitals:    06/11/17 1408 06/11/17 1530 06/11/17 1805 06/11/17 1845  BP: 112/71 (!) 108/55  (!) 118/98  Pulse: 99 97  98  Resp: _0 Temp:   98.2 F (36.8 C)   TempSrc:   Oral   SpO2: 100% 97%  98%  Weight:      Height:         General: Appears calm and comfortable but ill Eyes:  PERRL, EOMI, normal lids, iris ENT:  grossly normal hearing, lips & tongue, mmm; clear TMs; appropriate dentition Neck:  no LAD, masses or thyromegaly; no carotid bruits Cardiovascular:  RRR, no m/r/g. No LE edema.  Respiratory:   CTA bilaterally with no wheezes/rales/rhonchi.  Normal respiratory effort. Abdomen:  soft, NT, ND, NABS Skin:  no rash or induration seen on limited exam; no lesions c/w zoster along the head/neck Musculoskeletal:  grossly normal tone BUE/BLE, good ROM, no bony abnormality; tenderness along the occipital head of the trapezius muscle Lower extremity:  No LE edema.  Limited foot exam with no ulcerations.  2+ distal pulses. Psychiatric:  blunted mood and affect, speech fluent and appropriate, AOx3 Neurologic:  CN 2-12 grossly intact, moves all extremities in coordinated fashion, sensation intact    Radiological Exams on Admission: No results found.  EKG: not done   Labs on Admission: I have personally reviewed the available labs and imaging studies at the time of the admission.  Pertinent labs:   Glucose 114 CK 22 WBC 10.6, improved from 13.1 on 12/1 Hgb 11.7 - stable ESR 123, increased from 99 on 11/28 Flu negative UA: 20 ketones, small LE, 30 protein Blood cultures negative x 48 hours Lactate 0.7 on 12/1   Assessment/Plan Active Problems:   Generalized pain   Weakness   -Patient is an extremely functional 66yo female who walks 3 miles at baseline -She had an acute illness on 11/12 and was given Azithromycin -She had worsening weakness and myalgias/arthalgias on 11/24 which have not improved despite Levaquin -ESR is elevated but this is her only remarkable lab  finding -She has posterior neck pain but no apparent  temporal pain so temporal arteritis is less likely - and this far out without vision loss is reassuring -Her WBC count is improving -She had extensive imaging today - CT neck (severe left C4-5 and severe right C5-6 neural foraminal narrowing); head; and C/A/P - all essentially negative -If the problem was related to her C-spine disease (or a diskitis or other acute problem), she would be expected to have weakness of her upper extremities rather than diffusely; however, she is recommended to f/u with orthopedics this week for consideration of neurosurgical consultation -This may be a post-viral syndrome which would be expected to improve spontaneously -It could be a reaction to the Levaquin since it is known to cause arthralgias and myalgias; this medication was stopped yesterday and should be out of her system soon since the half-life is 6-8 hours -Her orthostatic BPs were: Lying 148/68, P 98; Sitting 118/98, P 106; Standing 116/62, P 101.  Since her pulse did not increase with standing, she is technically not orthostatic but with her decreasing BPs and also urine ketones, she likely was a bit volume depleted which may have contributed to her symptoms. -Malignancy was considered given her night sweats and possible fevers, but with negative imaging and normal CBC and calcium, this is less likely. -Endocarditis was thought to be equally unlikely given no documented fevers, no murmur, and negative blood cultures at 48 hours; therefore, an Echo is not really indicated at this time.  Overall, despite an extremely thorough evaluation by multiple doctors on multiple occasions, little explanation has been found and most serious medical conditions have been essentially ruled out.  The patient was offered overnight observation with symptomatic treatment including Ultram for pain and Valium.  However, given little utility in overnight hospitalization, the  patient preferred to go home instead.  This was discussed at length with the patient and her Tracy Humphrey - both independently and then again in conjunction with Dr. Jacubowitz.  The patient is well aware of s/sx that would indicate need for repeat ER visit. She is planning to f/u with Dr. Simpson soon and will call her tomorrow.  She is planning to f/u with orthopedics later this week.  She may need outpatient referral to rheumatology - which is not available as an inpatient and which Dr. Simpson is able to arrange.  If she is not getting stronger quickly, she may benefit from outpatient PT/OT.  I suggest a limited number of Valium and Ultram to see if this will improve her discomfort.    This was an extensive consultation and the total encounter time exceeded 80 minutes.    Jennifer Yates MD [facility]  If note is complete, please contact covering daytime or nighttime physician. [email or web address] Password [email or web address]  06/11/2017, 10:50 PM   

## 2017-06-12 ENCOUNTER — Other Ambulatory Visit: Payer: Self-pay | Admitting: Family Medicine

## 2017-06-12 DIAGNOSIS — R52 Pain, unspecified: Secondary | ICD-10-CM

## 2017-06-12 DIAGNOSIS — M542 Cervicalgia: Secondary | ICD-10-CM | POA: Diagnosis not present

## 2017-06-12 DIAGNOSIS — R700 Elevated erythrocyte sedimentation rate: Secondary | ICD-10-CM

## 2017-06-12 NOTE — Progress Notes (Signed)
Ab rheuma

## 2017-06-14 ENCOUNTER — Ambulatory Visit: Payer: PPO | Admitting: Family Medicine

## 2017-06-14 ENCOUNTER — Encounter: Payer: Self-pay | Admitting: Family Medicine

## 2017-06-14 VITALS — BP 120/74 | HR 100 | Resp 16 | Ht 60.0 in | Wt 144.1 lb

## 2017-06-14 DIAGNOSIS — Z09 Encounter for follow-up examination after completed treatment for conditions other than malignant neoplasm: Secondary | ICD-10-CM | POA: Diagnosis not present

## 2017-06-14 DIAGNOSIS — M353 Polymyalgia rheumatica: Secondary | ICD-10-CM | POA: Diagnosis not present

## 2017-06-14 HISTORY — DX: Polymyalgia rheumatica: M35.3

## 2017-06-14 LAB — CULTURE, BLOOD (ROUTINE X 2)
CULTURE: NO GROWTH
Culture: NO GROWTH
Special Requests: ADEQUATE
Special Requests: ADEQUATE

## 2017-06-14 MED ORDER — PREDNISONE 1 MG PO TABS: ORAL_TABLET | ORAL | 0 refills | Status: DC

## 2017-06-14 NOTE — Assessment & Plan Note (Addendum)
New diagnosis b, with abrupt onset of severe symptoms following 3 days of levaquin, rapid response clinically to low dose steroids started initially by her orthopedic Doctor Patient has h/o steroid psychosis, hence she is currently being under dosed based on guidelines from the rheumatologist she is scheduled to see on 06/29/2017, however, the objective established at the visit today , which I believe is very appropriate is that she take the LOWEST dose of prednisone that she needs to keep her functional and pain free WITHOUT psychotic or irritable  Behavior Pt also provided with basic patient education literature about the disease

## 2017-06-14 NOTE — Patient Instructions (Addendum)
F/u in 3 months, call if you need me before  You have an appointment with rheumatology, important to keep this  I have changed the medication recommended by the rheumatologist to a much lower dose of the prednisone in the hope that you are able to tolerate this  You may take prednisone 1 mg one to tablets once daily

## 2017-06-14 NOTE — Assessment & Plan Note (Signed)
Sent from  The office on 06/11/2017 due to severe disabling pain, with the plan to admit, was discharged home after morphine and long observation with the offer of overnight stay. Pt has since been started on low dose steroid by hr ortho Doc with relief and has already been told of a possible dx of polymyalgia rheumatica by ortho , which is the current working diagnosis I also have, after having the benefit of rheumatology review of patient's record, so I am now in a position to move forward with  Management plan  Until she can be seen by rheumatology guidelines of rheumatologist who discussed her case with me, Dr Daneil DolinHawke's , whose input I GREATLY appreciate

## 2017-06-14 NOTE — Progress Notes (Signed)
SHAKEDRA BEAM     MRN: [id number]      DOB: [birth year]   HPI Ms. Ciszek is here for follow up from recent ED visit and acute onset severe disabling generalized muscle pain and stiffness , primarilty affecting neck and upper extremities, which started abruptly after taking levaquin for 3 days, approximately 1 week ago. Since the ED visit, Ms Shor has been evaluated by her orthopedic surgeon, who  advised her that he believes that her condition is polymyalgia rheumatica and was able to get her to agree to a low dose of IM steroid and a 3 day course of prednisone 5 mg tablets, 3 then 2 then 1 tablet, for relief of her severe disabling symptoms. She can relate that shortly after getting the steroid injection she began feeling symptome relief, but states she has become irritable and short tempered with even the low dose of steroids and she is deathly afraid of becoming psychotic as she has in the past. I am able to relate to her that the rheumatologist that I reached out to , Dr Gavin Pound,  directly contacted me  yesterday  after reviewing her records,  And that she lso believes that Kirstine's diagnosis is  polymyalgia rheumatica.  I specifically enquired about any additional tests to establish this  diagnosis , she said "No". Treatment definitive is prednisone, and clinical response as well as fall in ESR.is test of cure She recommended medrol  4 mg tablets starting at 16 mg daily when I made her aware of Ms Howze's h/o steroid psychosis, and the fact that the  earliest she can be seen is 12 /21/2018.she recommended I go ahead and start treating her  Since Keigan has already been exposed to and is tolerating the prednisone to some extent,  After discussion with her, I am opting to keep her on the prednisone but am prescribing 1 to 2 mg daily, hoping that this dose will be both beneficial to her and tolerated. As far as physical activity is concerned, she has been advised by orthopedics , and I also agree , that  she should resume daily physical activity,like walking ,  stretching and strengthening exercises.Water aerobics was also mentioned. She states that although she agrees that she may well have had some underlying problem smoldering, there is "no doubt in my  mind that levaquin lit a match under the problem' She also mentioned that she wanted the remaining 3 tablets of levaquin that were dispensed tested for impurities or to see if they were actually levaquin, as she had a family member who had been given either contaminated /incorrect medication by a pharmacy and had a settlement, and that the levaquin has caused her significant pain and suffering and that this was the first time in her life she was afraid to sleep and that  she actually felt she would die. Thankfully today she is  much better, pain free , and able to raise her arms freely and move freely with no pain. She denies any current unilateral headache , jaw pain or vision loss, I advised/ warned her  That should  Any of these symptoms occur, she should go directly to the ED , as that could be due to giant cell arteritis , a condition diagnosed only by biopsy of the  artery  ROS See HPI   PE  BP 120/74   Pulse 100   Resp 16   Ht 5' (1.524 m)   Wt 144 lb 1.9 oz (  65.4 kg)   SpO2 98%   BMI 28.15 kg/m   Patient alert and oriented and in no cardiopulmonary distress. No pain with movement  Able to  ove upper and lower extremities freely  HEENT: No facial asymmetry, EOMI,   oropharynx pink and moist.  Neck supple no JVD, no mass.  Chest: Clear to auscultation bilaterally.  CVS: S1, S2 no murmurs, no S3.Regular rate.  ABD: Soft non tender.   Ext: No edema  MS: Adequate ROM spine, shoulders, hips and knees.  Skin: Intact, no ulcerations or rash noted.  Psych: Good eye contact, normal affect. Memory intact not anxious or depressed appearing.Not tearful  CNS: CN 2-12 intact, power,  normal throughout.no focal deficits  noted.   Assessment & Plan  Polymyalgia rheumatica ([place]) New diagnosis b, with abrupt onset of severe symptoms following 3 days of levaquin, rapid response clinically to low dose steroids started initially by her orthopedic Doctor Patient has h/o steroid psychosis, hence she is currently being under dosed based on guidelines from the rheumatologist she is scheduled to see on 06/29/2017, however, the objective established at the visit today , which I believe is very appropriate is that she take the LOWEST dose of prednisone that she needs to keep her functional and pain free WITHOUT psychotic or irritable  Behavior Pt also provided with basic patient education literature about the disease  Encounter for examination following treatment at hospital Sent from  The office on 06/11/2017 due to severe disabling pain, with the plan to admit, was discharged home after morphine and long observation with the offer of overnight stay. Pt has since been started on low dose steroid by hr ortho Doc with relief and has already been told of a possible dx of polymyalgia rheumatica by ortho , which is the current working diagnosis I also have, after having the benefit of rheumatology review of patient's record, so I am now in a position to move forward with  Management plan  Until she can be seen by rheumatology guidelines of rheumatologist who discussed her case with me, Dr Cathey Endow , whose input I GREATLY appreciate

## 2017-06-15 ENCOUNTER — Ambulatory Visit (HOSPITAL_COMMUNITY): Payer: PPO

## 2017-06-18 ENCOUNTER — Encounter: Payer: Self-pay | Admitting: Family Medicine

## 2017-06-25 ENCOUNTER — Encounter: Payer: Self-pay | Admitting: Family Medicine

## 2017-06-25 DIAGNOSIS — R0781 Pleurodynia: Secondary | ICD-10-CM | POA: Diagnosis not present

## 2017-06-25 LAB — CBC WITH DIFFERENTIAL/PLATELET
BASOS ABS: 28 {cells}/uL (ref 0–200)
Basophils Relative: 0.3 %
EOS ABS: 74 {cells}/uL (ref 15–500)
Eosinophils Relative: 0.8 %
HCT: 36.5 % (ref 35.0–45.0)
HEMOGLOBIN: 12.4 g/dL (ref 11.7–15.5)
Lymphs Abs: 2668 cells/uL (ref 850–3900)
MCH: 29.2 pg (ref 27.0–33.0)
MCHC: 34 g/dL (ref 32.0–36.0)
MCV: 85.9 fL (ref 80.0–100.0)
MONOS PCT: 7.1 %
MPV: 11 fL (ref 7.5–12.5)
NEUTROS ABS: 5778 {cells}/uL (ref 1500–7800)
Neutrophils Relative %: 62.8 %
Platelets: 318 10*3/uL (ref 140–400)
RBC: 4.25 10*6/uL (ref 3.80–5.10)
RDW: 12.2 % (ref 11.0–15.0)
Total Lymphocyte: 29 %
WBC mixed population: 653 cells/uL (ref 200–950)
WBC: 9.2 10*3/uL (ref 3.8–10.8)

## 2017-06-25 LAB — BASIC METABOLIC PANEL
BUN: 19 mg/dL (ref 7–25)
CALCIUM: 9.7 mg/dL (ref 8.6–10.4)
CO2: 33 mmol/L — AB (ref 20–32)
Chloride: 101 mmol/L (ref 98–110)
Creat: 0.64 mg/dL (ref 0.50–0.99)
GLUCOSE: 157 mg/dL — AB (ref 65–139)
POTASSIUM: 4.2 mmol/L (ref 3.5–5.3)
SODIUM: 141 mmol/L (ref 135–146)

## 2017-06-25 LAB — SEDIMENTATION RATE: Sed Rate: 119 mm/h — ABNORMAL HIGH (ref 0–30)

## 2017-06-28 ENCOUNTER — Encounter: Payer: Self-pay | Admitting: Family Medicine

## 2017-06-29 DIAGNOSIS — E663 Overweight: Secondary | ICD-10-CM | POA: Diagnosis not present

## 2017-06-29 DIAGNOSIS — Z6827 Body mass index (BMI) 27.0-27.9, adult: Secondary | ICD-10-CM | POA: Diagnosis not present

## 2017-06-29 DIAGNOSIS — M2550 Pain in unspecified joint: Secondary | ICD-10-CM | POA: Diagnosis not present

## 2017-07-16 ENCOUNTER — Ambulatory Visit: Payer: PPO | Admitting: Family Medicine

## 2017-07-21 ENCOUNTER — Encounter: Payer: Self-pay | Admitting: Family Medicine

## 2017-07-31 DIAGNOSIS — M2550 Pain in unspecified joint: Secondary | ICD-10-CM | POA: Diagnosis not present

## 2017-07-31 DIAGNOSIS — Z6827 Body mass index (BMI) 27.0-27.9, adult: Secondary | ICD-10-CM | POA: Diagnosis not present

## 2017-07-31 DIAGNOSIS — E663 Overweight: Secondary | ICD-10-CM | POA: Diagnosis not present

## 2017-08-07 DIAGNOSIS — Z961 Presence of intraocular lens: Secondary | ICD-10-CM | POA: Diagnosis not present

## 2017-08-07 LAB — HM DIABETES EYE EXAM

## 2017-09-10 ENCOUNTER — Encounter: Payer: Self-pay | Admitting: Family Medicine

## 2017-12-06 ENCOUNTER — Ambulatory Visit (INDEPENDENT_AMBULATORY_CARE_PROVIDER_SITE_OTHER): Payer: Self-pay

## 2017-12-06 DIAGNOSIS — Z1211 Encounter for screening for malignant neoplasm of colon: Secondary | ICD-10-CM

## 2017-12-06 MED ORDER — PEG 3350-KCL-NA BICARB-NACL 420 G PO SOLR: 4000.0000 mL | ORAL | 0 refills | Status: DC

## 2017-12-06 NOTE — Patient Instructions (Signed)
Tracy Humphrey  [birth year] MRN: [id number]     Procedure Date: 01/01/18 Time to register: 12:00 Place to register: [facility] Procedure Time: 1:00 Scheduled provider: R. Garfield Cornea, MD    PREPARATION FOR COLONOSCOPY WITH SUPREP BOWEL PREP KIT  Note: Suprep Bowel Prep Kit is a split-dose (2day) regimen. Consumption of BOTH 6-ounce bottles is required for a complete prep.  Please notify us immediately if you are diabetic, take iron supplements, or if you are on Coumadin or any other blood thinners.                                                                                                                                                    2 DAYS BEFORE PROCEDURE:  DATE: 12/30/17   DAY: Sunday Begin clear liquid diet AFTER your lunch meal. NO SOLID FOODS after this point.  1 DAY BEFORE PROCEDURE:  DATE: 12/31/17   DAY: Monday Continue clear liquids the entire day - NO SOLID FOOD.     At 6:00pm: Complete steps 1 through 4 below, using ONE (1) 6-ounce bottle, before going to bed. Step 1:  Pour ONE (1) 6-ounce bottle of SUPREP liquid into the mixing container.  Step 2:  Add cool drinking water to the 16 ounce line on the container and mix.  Note: Dilute the solution concentrate as directed prior to use. Step 3:  DRINK ALL the liquid in the container. Step 4:  You MUST drink an additional two (2) or more 16 ounce containers of water  over the next one (1) hour.   Continue clear liquids only, until midnight. Do not eat or drink anything after midnight. EXCEPTION: If you take medications for your heart, blood pressure, or breathing, you may take these medications with a small amount of clear liquid.   DAY OF PROCEDURE:   DATE: 01/01/18   DAY: Tuesday    5 hours before your procedure at 8:00am: Step 1:  Pour ONE (1) 6-ounce bottle of SUPREP liquid into the mixing container.  Step 2:  Add cool drinking water to the 16 ounce line on the container and mix.  Note: Dilute  the solution concentrate as directed prior to use. Step 3:  DRINK ALL the liquid in the container. Step 4:  You MUST drink an additional two (2) or more 16 ounce containers of water  over the next one (1) hour. You MUST complete the final glass of water at least  3 hours before your colonoscopy.   Nothing by mouth past 10:00am  You may take your morning medications with sip of water unless we have instructed otherwise.    Please see below for Dietary Information.  CLEAR LIQUIDS INCLUDE:  Water Jello (NOT red in color)   Ice Popsicles (NOT red in color)   Tea (sugar ok, no milk/cream) Powdered  fruit flavored drinks  Coffee (sugar ok, no milk/cream) Gatorade/ Lemonade/ Kool-Aid  (NOT red in color)   Juice: apple, white grape, white cranberry Soft drinks  Clear bullion, consomme, broth (fat free beef/chicken/vegetable)  Carbonated beverages (any kind)  Strained chicken noodle soup Hard Candy   Remember: Clear liquids are liquids that will allow you to see your fingers on the other side of a clear glass. Be sure liquids are NOT red in color, and not cloudy, but CLEAR.  DO NOT EAT OR DRINK ANY OF THE FOLLOWING:  Dairy products of any kind   Cranberry juice Tomato juice / V8 juice   Grapefruit juice Orange juice     Red grape juice  Do not eat any solid foods, including such foods as: cereal, oatmeal, yogurt, fruits, vegetables, creamed soups, eggs, bread, crackers, pureed foods in a blender, etc.   HELPFUL HINTS FOR DRINKING PREP SOLUTION:   Make sure prep is extremely cold. Mix and refrigerate the the morning of the prep. You may also put in the freezer.   You may try mixing some Crystal Light or Country Time Lemonade if you prefer. Mix in small amounts; add more if necessary.  Try drinking through a straw  Rinse mouth with water or a mouthwash between glasses, to remove after-taste.  Try sipping on a cold beverage /ice/ popsicles between glasses of prep.  Place a piece of  sugar-free hard candy in mouth between glasses.  If you become nauseated, try consuming smaller amounts, or stretch out the time between glasses. Stop for 30-60 minutes, then slowly start back drinking.     OTHER INSTRUCTIONS  You will need a responsible adult at least 69 years of age to accompany you and drive you home. This person must remain in the waiting room during your procedure. The hospital will cancel your procedure if you do not have a responsible adult with you.   1. Wear loose fitting clothing that is easily removed. 2. Leave jewelry and other valuables at home.  3. Remove all body piercing jewelry and leave at home. 4. Total time from sign-in until discharge is approximately 2-3 hours. 5. You should go home directly after your procedure and rest. You can resume normal activities the day after your procedure. 6. The day of your procedure you should not:  Drive  Make legal decisions  Operate machinery  Drink alcohol  Return to work   You may call the office (Dept: [phone]) before 5:00pm, or page the doctor on call [phone]) after 5:00pm, for further instructions, if necessary.   Insurance Information YOU WILL NEED TO CHECK WITH YOUR INSURANCE COMPANY FOR THE BENEFITS OF COVERAGE YOU HAVE FOR THIS PROCEDURE.  UNFORTUNATELY, NOT ALL INSURANCE COMPANIES HAVE BENEFITS TO COVER ALL OR PART OF THESE TYPES OF PROCEDURES.  IT IS YOUR RESPONSIBILITY TO CHECK YOUR BENEFITS, HOWEVER, WE WILL BE GLAD TO ASSIST YOU WITH ANY CODES YOUR INSURANCE COMPANY MAY NEED.    PLEASE NOTE THAT MOST INSURANCE COMPANIES WILL NOT COVER A SCREENING COLONOSCOPY FOR PEOPLE UNDER THE AGE OF 69  IF YOU HAVE BCBS INSURANCE, YOU MAY HAVE BENEFITS FOR A SCREENING COLONOSCOPY BUT IF POLYPS ARE FOUND THE DIAGNOSIS WILL CHANGE AND THEN YOU MAY HAVE A DEDUCTIBLE THAT WILL NEED TO BE MET. SO PLEASE MAKE SURE YOU CHECK YOUR BENEFITS FOR A SCREENING COLONOSCOPY AS WELL AS A DIAGNOSTIC  COLONOSCOPY.

## 2017-12-06 NOTE — Progress Notes (Signed)
Gastroenterology Pre-Procedure Review  Request Date:12/06/17 Requesting Physician: Dr.Simpson ( previous tcs 02/13/05 RMR-normal tcs  PATIENT REVIEW QUESTIONS: The patient responded to the following health history questions as indicated:    1. Diabetes Melitis: no 2. Joint replacements in the past 12 months: no 3. Major health problems in the past 3 months: no 4. Has an artificial valve or MVP: no 5. Has a defibrillator: no 6. Has been advised in past to take antibiotics in advance of a procedure like teeth cleaning: no 7. Family history of colon cancer: no  8. Alcohol Use: no 9. History of sleep apnea: no  10. History of coronary artery or other vascular stents placed within the last 12 months: no 11. History of any prior anesthesia complications: no    MEDICATIONS & ALLERGIES:    Patient reports the following regarding taking any blood thinners:   Plavix? no Aspirin? yes ( ) Coumadin? no Brilinta? no Xarelto? no Eliquis? no Pradaxa? no Savaysa? no Effient? no  Patient confirms/reports the following medications:  Current Outpatient Medications  Medication Sig Dispense Refill  . aspirin (ASPIRIN LOW DOSE) 81 MG EC tablet Take 81 mg by mouth daily.      . calcium carbonate (TUMS - DOSED IN MG ELEMENTAL CALCIUM) 500 MG chewable tablet Chew 2 tablets by mouth daily.      . Isopropyl Alcohol (SWIMMERS EAR DROPS) 95 % LIQD Place 1-4 drops into both ears daily as needed.    . loratadine (CLARITIN) 10 MG tablet Take 1 tablet (10 mg total) by mouth daily. 90 tablet 3  . Multiple Vitamin (TAB-A-VITE) TABS 1 tablet daily (Patient taking differently: Take 1 tablet by mouth daily. 1 tablet daily) 90 tablet 3  . naproxen sodium (ALEVE) 220 MG tablet Take 220 mg by mouth 2 (two) times daily as needed.    . pseudoephedrine (SUDAFED) 30 MG tablet Take 30 mg by mouth daily.    Marland Kitchen azelastine (ASTELIN) 0.1 % nasal spray Place 2 sprays into both nostrils 2 (two) times daily. Use in each nostril  as directed 90 mL 12   No current facility-administered medications for this visit.     Patient confirms/reports the following allergies:  Allergies  Allergen Reactions  . Levaquin [Levofloxacin In D5w] Anaphylaxis    Generalized pain  . Prednisone     All steroids  . Penicillins Rash    .Has patient had a PCN reaction causing immediate rash, facial/tongue/throat swelling, SOB or lightheadedness with hypotension: Yes Has patient had a PCN reaction causing severe rash involving mucus membranes or skin necrosis: No Has patient had a PCN reaction that required hospitalization: No Has patient had a PCN reaction occurring within the last 10 years: No If all of the above answers are "NO", then may proceed with Cephalosporin use.     No orders of the defined types were placed in this encounter.   AUTHORIZATION INFORMATION Primary Insurance: [employer] advantage,  ID #:[id number] Pre-Cert / Auth required: no   SCHEDULE INFORMATION: Procedure has been scheduled as follows:  Date: 01/01/18, Time: 1:00 Location: [facility] Dr.Rourk  This Gastroenterology Pre-Precedure Review Form is being routed to the following provider(s): Wynne Dust NP

## 2017-12-07 NOTE — Progress Notes (Signed)
Ok to schedule.

## 2018-01-01 ENCOUNTER — Encounter (HOSPITAL_COMMUNITY)
Admission: RE | Disposition: A | Discharge status: Home / Self Care | Payer: Self-pay | Source: Ambulatory Visit | Attending: Internal Medicine

## 2018-01-01 ENCOUNTER — Encounter (HOSPITAL_COMMUNITY): Payer: Self-pay | Admitting: *Deleted

## 2018-01-01 ENCOUNTER — Ambulatory Visit (HOSPITAL_COMMUNITY)
Admission: RE | Admit: 2018-01-01 | Discharge: 2018-01-01 | Disposition: A | Discharge status: Home / Self Care | Payer: PPO | Source: Ambulatory Visit | Attending: Internal Medicine | Admitting: Internal Medicine

## 2018-01-01 ENCOUNTER — Other Ambulatory Visit: Payer: Self-pay

## 2018-01-01 DIAGNOSIS — Z1211 Encounter for screening for malignant neoplasm of colon: Secondary | ICD-10-CM

## 2018-01-01 DIAGNOSIS — E785 Hyperlipidemia, unspecified: Secondary | ICD-10-CM | POA: Insufficient documentation

## 2018-01-01 DIAGNOSIS — K5730 Diverticulosis of large intestine without perforation or abscess without bleeding: Secondary | ICD-10-CM | POA: Diagnosis not present

## 2018-01-01 DIAGNOSIS — Z79899 Other long term (current) drug therapy: Secondary | ICD-10-CM | POA: Insufficient documentation

## 2018-01-01 DIAGNOSIS — K219 Gastro-esophageal reflux disease without esophagitis: Secondary | ICD-10-CM | POA: Diagnosis not present

## 2018-01-01 DIAGNOSIS — Z7982 Long term (current) use of aspirin: Secondary | ICD-10-CM | POA: Diagnosis not present

## 2018-01-01 HISTORY — PX: COLONOSCOPY: SHX5424

## 2018-01-01 SURGERY — COLONOSCOPY
Anesthesia: Moderate Sedation

## 2018-01-01 MED ORDER — MIDAZOLAM HCL 5 MG/5ML IJ SOLN
INTRAMUSCULAR | Status: AC
  Filled 2018-01-01: qty 10

## 2018-01-01 MED ORDER — MEPERIDINE HCL 100 MG/ML IJ SOLN
INTRAMUSCULAR | Status: DC | PRN
  Administered 2018-01-01 (×2): 25 mg via INTRAVENOUS
  Administered 2018-01-01: 50 mg via INTRAVENOUS

## 2018-01-01 MED ORDER — SODIUM CHLORIDE 0.9 % IV SOLN
INTRAVENOUS | Status: DC
  Administered 2018-01-01: 12:00:00 via INTRAVENOUS

## 2018-01-01 MED ORDER — MEPERIDINE HCL 100 MG/ML IJ SOLN
INTRAMUSCULAR | Status: AC
  Filled 2018-01-01: qty 2

## 2018-01-01 MED ORDER — ONDANSETRON HCL 4 MG/2ML IJ SOLN
INTRAMUSCULAR | Status: AC
  Filled 2018-01-01: qty 2

## 2018-01-01 MED ORDER — SIMETHICONE 40 MG/0.6ML PO SUSP
ORAL | Status: DC | PRN
  Administered 2018-01-01: 12:00:00

## 2018-01-01 MED ORDER — MIDAZOLAM HCL 5 MG/5ML IJ SOLN
INTRAMUSCULAR | Status: DC | PRN
  Administered 2018-01-01 (×2): 1 mg via INTRAVENOUS
  Administered 2018-01-01: 2 mg via INTRAVENOUS

## 2018-01-01 MED ORDER — ONDANSETRON HCL 4 MG/2ML IJ SOLN
INTRAMUSCULAR | Status: DC | PRN
  Administered 2018-01-01: 4 mg via INTRAVENOUS

## 2018-01-01 NOTE — Op Note (Signed)
[facility] Patient Name: Tracy Humphrey Procedure Date: 01/01/2018 11:14 AM MRN: [id number] Date of Birth: 30-Sep-1950 Attending MD: Gennette Pac , MD CSN: [id number] Age: 67 Admit Type: Outpatient Procedure:                Colonoscopy Indications:              Screening for colorectal malignant neoplasm Providers:                Gennette Pac, MD, Criselda Peaches. Mathis Fare RN, RN,                            Dyann Ruddle Referring MD:              Medicines:                Midazolam 4 mg IV, Meperidine 100 mg IV,                            Ondansetron 4 mg IV Complications:            No immediate complications. Estimated Blood Loss:     Estimated blood loss: none. Procedure:                Pre-Anesthesia Assessment:                           - Prior to the procedure, a History and Physical                            was performed, and patient medications and                            allergies were reviewed. The patient's tolerance of                            previous anesthesia was also reviewed. The risks                            and benefits of the procedure and the sedation                            options and risks were discussed with the patient.                            All questions were answered, and informed consent                            was obtained. Prior Anticoagulants: The patient has                            taken no previous anticoagulant or antiplatelet                            agents. ASA Grade Assessment: II - A patient with  mild systemic disease. After reviewing the risks                            and benefits, the patient was deemed in                            satisfactory condition to undergo the procedure.                           After obtaining informed consent, the colonoscope                            was passed under direct vision. Throughout the                            procedure, the patient's  blood pressure, pulse, and                            oxygen saturations were monitored continuously. The                            EC-[id number] ([id number]) scope was introduced through                            the anus and advanced to the the cecum, identified                            by appendiceal orifice and ileocecal valve. The                            colonoscopy was performed without difficulty. The                            patient tolerated the procedure well. The quality                            of the bowel preparation was adequate. Scope In: 12:27:52 PM Scope Out: 12:42:52 PM Scope Withdrawal Time: 0 hours 9 minutes 2 seconds  Total Procedure Duration: 0 hours 15 minutes 0 seconds  Findings:      The perianal and digital rectal examinations were normal. Rectal mucosa       seen very well on?"face. Rectal vault to0 small to retroflexed.      A few medium-mouthed diverticula were found in the entire colon.      The exam was otherwise without abnormality on direct examination. Impression:               - Diverticulosis in the entire examined colon.                           - The examination was otherwise normal on direct                            and retroflexion views.                           -  No specimens collected. Moderate Sedation:      Moderate (conscious) sedation was administered by the endoscopy nurse       and supervised by the endoscopist. The following parameters were       monitored: oxygen saturation, heart rate, blood pressure, respiratory       rate, EKG, adequacy of pulmonary ventilation, and response to care.       Total physician intraservice time was 18 minutes. Recommendation:           - Patient has a contact number available for                            emergencies. The signs and symptoms of potential                            delayed complications were discussed with the                            patient. Return to normal activities  tomorrow.                            Written discharge instructions were provided to the                            patient.                           - No repeat colonoscopy due to age.                           - Return to GI clinic PRN. Procedure Code(s):        --- Professional ---                           [id number], Colonoscopy, flexible; diagnostic, including                            collection of specimen(s) by brushing or washing,                            when performed (separate procedure)                           G0500, Moderate sedation services provided by the                            same physician or other qualified health care                            professional performing a gastrointestinal                            endoscopic service that sedation supports,                            requiring the presence of an independent trained  observer to assist in the monitoring of the                            patient's level of consciousness and physiological                            status; initial 15 minutes of intra-service time;                            patient age 3 years or older (additional time may                            be reported with 74259, as appropriate) Diagnosis Code(s):        --- Professional ---                           Z12.11, Encounter for screening for malignant                            neoplasm of colon                           K57.30, Diverticulosis of large intestine without                            perforation or abscess without bleeding CPT copyright 2017 American Medical Association. All rights reserved. The codes documented in this report are preliminary and upon coder review may  be revised to meet current compliance requirements. Gerrit Friends. Ryenn Howeth, MD Gennette Pac, MD 01/01/2018 12:57:08 PM This report has been signed electronically. Number of Addenda: 0

## 2018-01-01 NOTE — H&P (Signed)
 @LOGO @   Primary Care Physician:  Kerri PerchesSimpson, Margaret E, MD Primary Gastroenterologist:  Dr. Jena Gaussourk  Pre-Procedure History & Physical: HPI:  Tracy Humphrey is a 67 y.o. female is here for a screening colonoscopy. No bowel symptoms. No family history of colon cancer. Name colonoscopy 2006.  Past Medical History:  Diagnosis Date  . Allergy    prrenial  . BPV (benign positional vertigo) 11/04/2011  . GERD (gastroesophageal reflux disease)   . Helicobacter pylori ab+ 2014   treated by GI  . Hyperlipidemia 2013  . IGT (impaired glucose tolerance) 2013  . Meniere disease   . Mild diastolic dysfunction 06/2013   grade 2  . Obesity 2007    Prior to this had no weight issue   . Superficial thrombophlebitis 2015  . Vertigo     Past Surgical History:  Procedure Laterality Date  . BREAST BIOPSY Right    cyst  . CATARACT EXTRACTION W/PHACO Left 07/21/2014   Procedure: CATARACT EXTRACTION PHACO AND INTRAOCULAR LENS PLACEMENT (IOC);  Surgeon: Loraine LericheMark T. Nile RiggsShapiro, MD;  Location: [facility];  Service: Ophthalmology;  Laterality: Left;  CDE 3.91  . CATARACT EXTRACTION W/PHACO Right 08/04/2014   Procedure: CATARACT EXTRACTION PHACO AND INTRAOCULAR LENS PLACEMENT; CDE:  5.38;  Surgeon: Loraine LericheMark T. Nile RiggsShapiro, MD;  Location: [facility];  Service: Ophthalmology;  Laterality: Right;  . CESAREAN SECTION  29 years ago   . COLONOSCOPY  02/13/2005   Dr. Jena Gaussourk- normal rectum, normal colon  . ESOPHAGOGASTRODUODENOSCOPY N/A 06/26/2013   Procedure: ESOPHAGOGASTRODUODENOSCOPY (EGD);  Surgeon: Corbin Adeobert M Leather Estis, MD;  Location: [facility];  Service: Endoscopy;  Laterality: N/A;  8:00  . EYE SURGERY Bilateral 2015   cataract extraction  . Right foot surgery 2003. 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 Authorizing Provider  aspirin (ASPIRIN LOW DOSE) 81 MG EC tablet Take 81 mg by mouth daily.     Yes [provider]  calcium carbonate (TUMS - DOSED IN MG ELEMENTAL CALCIUM) 500 MG chewable tablet Chew 2 tablets by mouth daily.     Yes [provider]  Isopropyl Alcohol (SWIMMERS EAR DROPS) 95 % LIQD Place 2 drops into both ears once a week.    Yes [provider]  loratadine (CLARITIN) 10 MG tablet Take 1 tablet (10 mg total) by mouth daily. 02/03/16  Yes Kerri PerchesSimpson, Margaret E, MD  Multiple Vitamin (TAB-A-VITE) TABS 1 tablet daily 10/26/15  Yes Kerri PerchesSimpson, Margaret E, MD  naproxen sodium (ALEVE) 220 MG tablet Take 220 mg by mouth daily as needed (pain).    Yes [provider]  phenylephrine (SUDAFED PE) 10 MG TABS tablet Take 10 mg by mouth daily.   Yes [provider]  polyethylene glycol-electrolytes (TRILYTE) 420 g solution Take 4,000 mLs by mouth as directed. 12/06/17  Yes Anice PaganiniGill, Eric A, NP  azelastine (ASTELIN) 0.1 % nasal spray Place 2 sprays into both nostrils 2 (two) times daily. Use in each nostril as directed Patient taking differently: Place 2 sprays into both nostrils daily as needed for allergies. Use in each nostril as directed 07/13/15 06/11/17  Kerri PerchesSimpson, Margaret E, MD    Allergies as of 12/06/2017 - Review Complete 12/06/2017  Allergen Reaction Noted  . Levaquin [levofloxacin in d5w] Anaphylaxis 06/11/2017  . Prednisone  03/08/2011  . Penicillins Rash 06/21/2009    Family History  Problem Relation Age of  Onset  . Dementia Mother 56  . Hypertension Mother   . Hypertension Father   . Heart disease Father   . Diabetes Father   . AAA (abdominal aortic aneurysm) Father   . Hypertension Sister   . Coronary artery disease Sister 25  . Hypertension Brother 80  . Alcohol abuse Brother   . AAA (abdominal aortic aneurysm) Paternal Grandfather     Social History   Socioeconomic History  . Marital status: Married    Spouse name: Not on file  . Number of children: Not on file  . Years of education: Not on file  . Highest education level: Not on  file  Occupational History  . Occupation: [employer]   Social Needs  . Financial resource strain: Not on file  . Food insecurity:    Worry: Not on file    Inability: Not on file  . Transportation needs:    Medical: Not on file    Non-medical: Not on file  Tobacco Use  . Smoking status: Never Smoker  . Smokeless tobacco: Never Used  . Tobacco comment: Never smoke  Substance and Sexual Activity  . Alcohol use: No  . Drug use: No  . Sexual activity: Yes    Birth control/protection: Post-menopausal  Lifestyle  . Physical activity:    Days per week: Not on file    Minutes per session: Not on file  . Stress: Not on file  Relationships  . Social connections:    Talks on phone: Not on file    Gets together: Not on file    Attends religious service: Not on file    Active member of club or organization: Not on file    Attends meetings of clubs or organizations: Not on file    Relationship status: Not on file  . Intimate partner violence:    Fear of current or ex partner: Not on file    Emotionally abused: Not on file    Physically abused: Not on file    Forced sexual activity: Not on file  Other Topics Concern  . Not on file  Social History Narrative  . Not on file    Review of Systems: See HPI, otherwise negative ROS  Physical Exam: BP (!) 144/70   Pulse 76   Temp 98.8 F (37.1 C) (Oral)   Resp 12   SpO2 98%  General:   Alert,  Well-developed, well-nourished, pleasant and cooperative in NAD  No acute distress. Heart:  Regular rate and rhythm; no murmurs, clicks, rubs,  or gallops. Abdomen:  Soft, nontender and nondistended. No masses, hepatosplenomegaly or hernias noted. Normal bowel sounds, without guarding, and without rebound.    Impression/Plan: Tracy Humphrey is now here to undergo a screening colonoscopy.  Average risk screening examination.  Risks, benefits, limitations, imponderables and alternatives regarding colonoscopy have been reviewed with the patient.  Questions have been answered. All parties agreeable.     Notice:  This dictation was prepared with Dragon dictation along with smaller phrase technology. Any transcriptional errors that result from this process are unintentional and may not be corrected upon review.

## 2018-01-01 NOTE — Discharge Instructions (Signed)
Colonoscopy Discharge Instructions  Read the instructions outlined below and refer to this sheet in the next few weeks. These discharge instructions provide you with general information on caring for yourself after you leave the hospital. Your doctor may also give you specific instructions. While your treatment has been planned according to the most current medical practices available, unavoidable complications occasionally occur. If you have any problems or questions after discharge, call Dr. Jena Gaussourk at [phone]. ACTIVITY  You may resume your regular activity, but move at a slower pace for the next 24 hours.   Take frequent rest periods for the next 24 hours.   Walking will help get rid of the air and reduce the bloated feeling in your belly (abdomen).   No driving for 24 hours (because of the medicine (anesthesia) used during the test).    Do not sign any important legal documents or operate any machinery for 24 hours (because of the anesthesia used during the test).  NUTRITION  Drink plenty of fluids.   You may resume your normal diet as instructed by your doctor.   Begin with a light meal and progress to your normal diet. Heavy or fried foods are harder to digest and may make you feel sick to your stomach (nauseated).   Avoid alcoholic beverages for 24 hours or as instructed.  MEDICATIONS  You may resume your normal medications unless your doctor tells you otherwise.  WHAT YOU CAN EXPECT TODAY  Some feelings of bloating in the abdomen.   Passage of more gas than usual.   Spotting of blood in your stool or on the toilet paper.  IF YOU HAD POLYPS REMOVED DURING THE COLONOSCOPY:  No aspirin products for 7 days or as instructed.   No alcohol for 7 days or as instructed.   Eat a soft diet for the next 24 hours.  FINDING OUT THE RESULTS OF YOUR TEST Not all test results are available during your visit. If your test results are not back during the visit, make an appointment  with your caregiver to find out the results. Do not assume everything is normal if you have not heard from your caregiver or the medical facility. It is important for you to follow up on all of your test results.  SEEK IMMEDIATE MEDICAL ATTENTION IF:  You have more than a spotting of blood in your stool.   Your belly is swollen (abdominal distention).   You are nauseated or vomiting.   You have a temperature over 101.   You have abdominal pain or discomfort that is severe or gets worse throughout the day.    Diverticulosis information provided  No future colonoscopy recommended unless new symptoms develop.  PATIENT INSTRUCTIONS POST-ANESTHESIA  IMMEDIATELY FOLLOWING SURGERY:  Do not drive or operate machinery for the first twenty four hours after surgery.  Do not make any important decisions for twenty four hours after surgery or while taking narcotic pain medications or sedatives.  If you develop intractable nausea and vomiting or a severe headache please notify your doctor immediately.  FOLLOW-UP:  Please make an appointment with your surgeon as instructed. You do not need to follow up with anesthesia unless specifically instructed to do so.  WOUND CARE INSTRUCTIONS (if applicable):  Keep a dry clean dressing on the anesthesia/puncture wound site if there is drainage.  Once the wound has quit draining you may leave it open to air.  Generally you should leave the bandage intact for twenty four hours unless there is  drainage.  If the epidural site drains for more than 36-48 hours please call the anesthesia department.  QUESTIONS?:  Please feel free to call your physician or the hospital operator if you have any questions, and they will be happy to assist you.      Colonoscopy, Adult, Care After This sheet gives you information about how to care for yourself after your procedure. Your doctor may also give you more specific instructions. If you have problems or questions, call your  doctor. Follow these instructions at home: General instructions   For the first 24 hours after the procedure: ? Do not drive or use machinery. ? Do not sign important documents. ? Do not drink alcohol. ? Do your daily activities more slowly than normal. ? Eat foods that are soft and easy to digest. ? Rest often.  Take over-the-counter or prescription medicines only as told by your doctor.  It is up to you to get the results of your procedure. Ask your doctor, or the department performing the procedure, when your results will be ready. To help cramping and bloating:  Try walking around.  Put heat on your belly (abdomen) as told by your doctor. Use a heat source that your doctor recommends, such as a moist heat pack or a heating pad. ? Put a towel between your skin and the heat source. ? Leave the heat on for 20-30 minutes. ? Remove the heat if your skin turns bright red. This is especially important if you cannot feel pain, heat, or cold. You can get burned. Eating and drinking  Drink enough fluid to keep your pee (urine) clear or pale yellow.  Return to your normal diet as told by your doctor. Avoid heavy or fried foods that are hard to digest.  Avoid drinking alcohol for as long as told by your doctor. Contact a doctor if:  You have blood in your poop (stool) 2-3 days after the procedure. Get help right away if:  You have more than a small amount of blood in your poop.  You see large clumps of tissue (blood clots) in your poop.  Your belly is swollen.  You feel sick to your stomach (nauseous).  You throw up (vomit).  You have a fever.  You have belly pain that gets worse, and medicine does not help your pain. This information is not intended to replace advice given to you by your health care provider. Make sure you discuss any questions you have with your health care provider.    Diverticulosis Diverticulosis is a condition that develops when small pouches  (diverticula) form in the wall of the large intestine (colon). The colon is where water is absorbed and stool is formed. The pouches form when the inside layer of the colon pushes through weak spots in the outer layers of the colon. You may have a few pouches or many of them. What are the causes? The cause of this condition is not known. What increases the risk? The following factors may make you more likely to develop this condition:  Being older than age 70. Your risk for this condition increases with age. Diverticulosis is rare among people younger than age 36. By age 48, many people have it.  Eating a low-fiber diet.  Having frequent constipation.  Being overweight.  Not getting enough exercise.  Smoking.  Taking over-the-counter pain medicines, like aspirin and ibuprofen.  Having a family history of diverticulosis.  What are the signs or symptoms? In most people, there  are no symptoms of this condition. If you do have symptoms, they may include:  Bloating.  Cramps in the abdomen.  Constipation or diarrhea.  Pain in the lower left side of the abdomen.  How is this diagnosed? This condition is most often diagnosed during an exam for other colon problems. Because diverticulosis usually has no symptoms, it often cannot be diagnosed independently. This condition may be diagnosed by:  Using a flexible scope to examine the colon (colonoscopy).  Taking an X-ray of the colon after dye has been put into the colon (barium enema).  Doing a CT scan.  How is this treated? You may not need treatment for this condition if you have never developed an infection related to diverticulosis. If you have had an infection before, treatment may include:  Eating a high-fiber diet. This may include eating more fruits, vegetables, and grains.  Taking a fiber supplement.  Taking a live bacteria supplement (probiotic).  Taking medicine to relax your colon.  Taking antibiotic  medicines.  Follow these instructions at home:  Drink 6-8 glasses of water or more each day to prevent constipation.  Try not to strain when you have a bowel movement.  If you have had an infection before: ? Eat more fiber as directed by your health care provider or your diet and nutrition specialist (dietitian). ? Take a fiber supplement or probiotic, if your health care provider approves.  Take over-the-counter and prescription medicines only as told by your health care provider.  If you were prescribed an antibiotic, take it as told by your health care provider. Do not stop taking the antibiotic even if you start to feel better.  Keep all follow-up visits as told by your health care provider. This is important. Contact a health care provider if:  You have pain in your abdomen.  You have bloating.  You have cramps.  You have not had a bowel movement in 3 days. Get help right away if:  Your pain gets worse.  Your bloating becomes very bad.  You have a fever or chills, and your symptoms suddenly get worse.  You vomit.  You have bowel movements that are bloody or black.  You have bleeding from your rectum. Summary  Diverticulosis is a condition that develops when small pouches (diverticula) form in the wall of the large intestine (colon).  You may have a few pouches or many of them.  This condition is most often diagnosed during an exam for other colon problems.  If you have had an infection related to diverticulosis, treatment may include increasing the fiber in your diet, taking supplements, or taking medicines. This information is not intended to replace advice given to you by your health care provider. Make sure you discuss any questions you have with your health care provider.

## 2018-01-03 ENCOUNTER — Encounter (HOSPITAL_COMMUNITY): Payer: Self-pay | Admitting: Internal Medicine

## 2018-04-15 DIAGNOSIS — M79671 Pain in right foot: Secondary | ICD-10-CM | POA: Diagnosis not present

## 2018-04-29 ENCOUNTER — Other Ambulatory Visit: Payer: Self-pay | Admitting: Family Medicine

## 2018-04-29 DIAGNOSIS — Z1231 Encounter for screening mammogram for malignant neoplasm of breast: Secondary | ICD-10-CM

## 2018-05-08 ENCOUNTER — Ambulatory Visit (INDEPENDENT_AMBULATORY_CARE_PROVIDER_SITE_OTHER): Payer: PPO

## 2018-05-08 DIAGNOSIS — Z23 Encounter for immunization: Secondary | ICD-10-CM

## 2018-06-17 ENCOUNTER — Ambulatory Visit (INDEPENDENT_AMBULATORY_CARE_PROVIDER_SITE_OTHER): Payer: PPO | Admitting: Family Medicine

## 2018-06-17 ENCOUNTER — Encounter: Payer: Self-pay | Admitting: Family Medicine

## 2018-06-17 VITALS — BP 124/66 | HR 84 | Temp 98.8°F | Resp 12 | Ht 60.0 in | Wt 149.1 lb

## 2018-06-17 DIAGNOSIS — J209 Acute bronchitis, unspecified: Secondary | ICD-10-CM | POA: Insufficient documentation

## 2018-06-17 DIAGNOSIS — J302 Other seasonal allergic rhinitis: Secondary | ICD-10-CM

## 2018-06-17 DIAGNOSIS — R5383 Other fatigue: Secondary | ICD-10-CM | POA: Diagnosis not present

## 2018-06-17 DIAGNOSIS — E119 Type 2 diabetes mellitus without complications: Secondary | ICD-10-CM

## 2018-06-17 DIAGNOSIS — E663 Overweight: Secondary | ICD-10-CM

## 2018-06-17 DIAGNOSIS — E8881 Metabolic syndrome: Secondary | ICD-10-CM | POA: Diagnosis not present

## 2018-06-17 DIAGNOSIS — E785 Hyperlipidemia, unspecified: Secondary | ICD-10-CM | POA: Diagnosis not present

## 2018-06-17 MED ORDER — BENZONATATE 100 MG PO CAPS: 100.0000 mg | ORAL_CAPSULE | Freq: Two times a day (BID) | ORAL | 0 refills | Status: DC | PRN

## 2018-06-17 MED ORDER — AZITHROMYCIN 250 MG PO TABS: ORAL_TABLET | ORAL | 0 refills | Status: DC

## 2018-06-17 NOTE — Progress Notes (Signed)
Tracy Humphrey     MRN: [id number]      DOB: 1951/03/22   HPI Tracy Humphrey is here  With a 6 day h/o head and chest congestion, sore throat , denies ear pain, fatigue and poor appetite. This following plane  trip where 2 other family members got sick   ROS . Denies chest pains, palpitations and leg swelling Denies abdominal pain, nausea, vomiting,diarrhea or constipation.   Denies dysuria, frequency, hesitancy or incontinence. Denies joint pain, swelling and limitation in mobility. Denies headaches, seizures, numbness, or tingling. Denies depression, anxiety or insomnia. Denies skin break down or rash.   PE  BP 124/66 (BP Location: Left Arm, Patient Position: Sitting, Cuff Size: Large)   Pulse 84   Temp 98.8 F (37.1 C) (Oral)   Resp 12   Ht 5' (1.524 m)   Wt 149 lb 1.9 oz (67.6 kg)   SpO2 97% Comment: room air  BMI 29.12 kg/m   Patient alert and oriented and in no cardiopulmonary distress.Ill appearing HEENT: No facial asymmetry, EOMI,   oropharynx pink and moist.  Neck supple no JVD, no mass.tM clear , no sinmus tenderness, erythema of nasal mucosa  Chest: Adequate air entry, few crackles , no wheezes CVS: S1, S2 no murmurs, no S3.Regular rate.  ABD: Soft non tender.   Ext: No edema  MS: Adequate ROM spine, shoulders, hips and knees.  Skin: Intact, no ulcerations or rash noted.  Psych: Good eye contact, normal affect. Memory intact not anxious or depressed appearing.  CNS: CN 2-12 intact, power,  normal throughout.no focal deficits noted.   Assessment & Plan  Acute bronchitis 1 week history of chest congestion and fatigue, Z pack and tessalon perles prescribed  Diabetes mellitus type 2, diet-controlled (HCC) Updated lab needed at/ before next visit. Tracy Humphrey is reminded of the importance of commitment to daily physical activity for 30 minutes or more, as able and the need to limit carbohydrate intake to 30 to 60 grams per meal to help with blood sugar control.        Diabetic Labs Latest Ref Rng & Units 06/25/2017 06/11/2017 06/09/2017 05/28/2017 12/12/2016  HbA1c <5.7 % of total Hgb - - - 5.8(H) -  Microalbumin Not Estab. ug/mL - - - - 219.6(H)  Micro/Creat Ratio 0.0 - 30.0 mg/g creat - - - - 59.2(H)  Chol <200 mg/dL - - - 409193 -  HDL >81>50 mg/dL - - - 19(J44(L) -  Calc LDL mg/dL (calc) - - - 478(G123(H) -  Triglycerides <150 mg/dL - - - 956148 -  Creatinine 0.50 - 0.99 mg/dL 2.130.64 0.860.66 5.780.61 - -   BP/Weight 06/17/2018 01/01/2018 06/14/2017 06/11/2017 06/11/2017 06/07/2017 05/21/2017  Systolic BP 124 117 120 118 140 118 118  Diastolic BP 66 65 74 98 80 80 80  Wt. (Lbs) 149.12 - 144.12 143 146 146 150  BMI 29.12 - 28.15 27.93 28.51 28.51 29.29  Some encounter information is confidential and restricted. Go to Review Flowsheets activity to see all data.   Foot/eye exam completion dates Latest Ref Rng & Units 06/17/2018 08/07/2017  Eye Exam No Retinopathy - No Retinopathy  Foot Form Completion - Done -        Seasonal allergies Recent flare with change in environment  Overweight (BMI 25.0-29.9) Deteriorated. Patient re-educated about  the importance of commitment to a  minimum of 150 minutes of exercise per week.  The importance of healthy food choices with portion control discussed. Encouraged to start  a food diary, count calories and to consider  joining a support group. Sample diet sheets offered. Goals set by the patient for the next several months.   Weight /BMI 06/17/2018 06/14/2017 06/11/2017  WEIGHT 149 lb 1.9 oz 144 lb 1.9 oz 143 lb  HEIGHT 5\' 0"  5\' 0"  5\' 0"   BMI 29.12 kg/m2 28.15 kg/m2 27.93 kg/m2  Some encounter information is confidential and restricted. Go to Review Flowsheets activity to see all data.

## 2018-06-17 NOTE — Patient Instructions (Addendum)
Annual Physical with MD in September, 2020, call if you need me before  You are treated for acute bronchitis, tessalon Perles ( decongestant) and an antibiotic Azithromycin are prescribed     CBC, fasting lipid, chem 7 and EGFr, TSH and vit D and HBA1C  This week please  All the best

## 2018-06-17 NOTE — Assessment & Plan Note (Addendum)
1 week history of chest congestion and fatigue, Z pack and tessalon perles prescribed

## 2018-06-24 ENCOUNTER — Encounter: Payer: Self-pay | Admitting: Family Medicine

## 2018-06-24 DIAGNOSIS — E119 Type 2 diabetes mellitus without complications: Secondary | ICD-10-CM | POA: Diagnosis not present

## 2018-06-24 DIAGNOSIS — E785 Hyperlipidemia, unspecified: Secondary | ICD-10-CM | POA: Diagnosis not present

## 2018-06-24 DIAGNOSIS — R5383 Other fatigue: Secondary | ICD-10-CM | POA: Diagnosis not present

## 2018-06-24 NOTE — Assessment & Plan Note (Signed)
Deteriorated. Patient re-educated about  the importance of commitment to a  minimum of 150 minutes of exercise per week.  The importance of healthy food choices with portion control discussed. Encouraged to start a food diary, count calories and to consider  joining a support group. Sample diet sheets offered. Goals set by the patient for the next several months.   Weight /BMI 06/17/2018 06/14/2017 06/11/2017  WEIGHT 149 lb 1.9 oz 144 lb 1.9 oz 143 lb  HEIGHT 5\' 0"  5\' 0"  5\' 0"   BMI 29.12 kg/m2 28.15 kg/m2 27.93 kg/m2  Some encounter information is confidential and restricted. Go to Review Flowsheets activity to see all data.

## 2018-06-24 NOTE — Assessment & Plan Note (Signed)
Recent flare with change in environment

## 2018-06-24 NOTE — Assessment & Plan Note (Signed)
Updated lab needed at/ before next visit. Tracy Humphrey is reminded of the importance of commitment to daily physical activity for 30 minutes or more, as able and the need to limit carbohydrate intake to 30 to 60 grams per meal to help with blood sugar control.       Diabetic Labs Latest Ref Rng & Units 06/25/2017 06/11/2017 06/09/2017 05/28/2017 12/12/2016  HbA1c <5.7 % of total Hgb - - - 5.8(H) -  Microalbumin Not Estab. ug/mL - - - - 219.6(H)  Micro/Creat Ratio 0.0 - 30.0 mg/g creat - - - - 59.2(H)  Chol <200 mg/dL - - - 213193 -  HDL >08>50 mg/dL - - - 65(H44(L) -  Calc LDL mg/dL (calc) - - - 846(N123(H) -  Triglycerides <150 mg/dL - - - 629148 -  Creatinine 0.50 - 0.99 mg/dL 5.280.64 4.130.66 2.440.61 - -   BP/Weight 06/17/2018 01/01/2018 06/14/2017 06/11/2017 06/11/2017 06/07/2017 05/21/2017  Systolic BP 124 117 120 118 140 118 118  Diastolic BP 66 65 74 98 80 80 80  Wt. (Lbs) 149.12 - 144.12 143 146 146 150  BMI 29.12 - 28.15 27.93 28.51 28.51 29.29  Some encounter information is confidential and restricted. Go to Review Flowsheets activity to see all data.   Foot/eye exam completion dates Latest Ref Rng & Units 06/17/2018 08/07/2017  Eye Exam No Retinopathy - No Retinopathy  Foot Form Completion - Done -

## 2018-06-25 ENCOUNTER — Encounter: Payer: Self-pay | Admitting: Family Medicine

## 2018-06-25 LAB — VITAMIN D 25 HYDROXY (VIT D DEFICIENCY, FRACTURES): VIT D 25 HYDROXY: 40 ng/mL (ref 30–100)

## 2018-06-25 LAB — LIPID PANEL
Cholesterol: 186 mg/dL (ref <200)
HDL: 57 mg/dL (ref >50)
LDL CHOLESTEROL (CALC): 107 mg/dL — AB
Non-HDL Cholesterol (Calc): 129 mg/dL (calc) (ref <130)
TRIGLYCERIDES: 128 mg/dL (ref <150)
Total CHOL/HDL Ratio: 3.3 (calc) (ref <5.0)

## 2018-06-25 LAB — CBC
HCT: 42.2 % (ref 35.0–45.0)
Hemoglobin: 14.8 g/dL (ref 11.7–15.5)
MCH: 30.5 pg (ref 27.0–33.0)
MCHC: 35.1 g/dL (ref 32.0–36.0)
MCV: 86.8 fL (ref 80.0–100.0)
MPV: 12.4 fL (ref 7.5–12.5)
PLATELETS: 183 10*3/uL (ref 140–400)
RBC: 4.86 10*6/uL (ref 3.80–5.10)
RDW: 12.2 % (ref 11.0–15.0)
WBC: 6.6 10*3/uL (ref 3.8–10.8)

## 2018-06-25 LAB — BASIC METABOLIC PANEL WITH GFR
BUN: 19 mg/dL (ref 7–25)
CO2: 28 mmol/L (ref 20–32)
CREATININE: 0.65 mg/dL (ref 0.50–0.99)
Calcium: 9.9 mg/dL (ref 8.6–10.4)
Chloride: 105 mmol/L (ref 98–110)
GFR, EST NON AFRICAN AMERICAN: 91 mL/min/{1.73_m2} (ref >=60)
GFR, Est African American: 105 mL/min/{1.73_m2} (ref >=60)
GLUCOSE: 108 mg/dL — AB (ref 65–99)
Potassium: 4.1 mmol/L (ref 3.5–5.3)
SODIUM: 142 mmol/L (ref 135–146)

## 2018-06-25 LAB — HEMOGLOBIN A1C
EAG (MMOL/L): 6.6 (calc)
Hgb A1c MFr Bld: 5.8 % of total Hgb — ABNORMAL HIGH (ref <5.7)
Mean Plasma Glucose: 120 (calc)

## 2018-06-25 LAB — TSH: TSH: 2.27 m[IU]/L (ref 0.40–4.50)

## 2018-06-26 ENCOUNTER — Ambulatory Visit (HOSPITAL_COMMUNITY)
Admission: RE | Admit: 2018-06-26 | Discharge: 2018-06-26 | Disposition: A | Discharge status: Home / Self Care | Payer: PPO | Source: Ambulatory Visit | Attending: Family Medicine | Admitting: Family Medicine

## 2018-06-26 DIAGNOSIS — Z1231 Encounter for screening mammogram for malignant neoplasm of breast: Secondary | ICD-10-CM | POA: Diagnosis not present

## 2018-06-28 ENCOUNTER — Ambulatory Visit (HOSPITAL_COMMUNITY): Payer: PPO

## 2018-08-26 IMAGING — MG 2D DIGITAL SCREENING BILATERAL MAMMOGRAM WITH CAD AND ADJUNCT TO
8 of 12 series · 8 of 32 positions shown · non-contrast
Comparison: Previous exam(s).

CLINICAL DATA: Screening.

EXAM:
2D DIGITAL SCREENING BILATERAL MAMMOGRAM WITH CAD AND ADJUNCT TOMO

[L CC (1 of 2)]
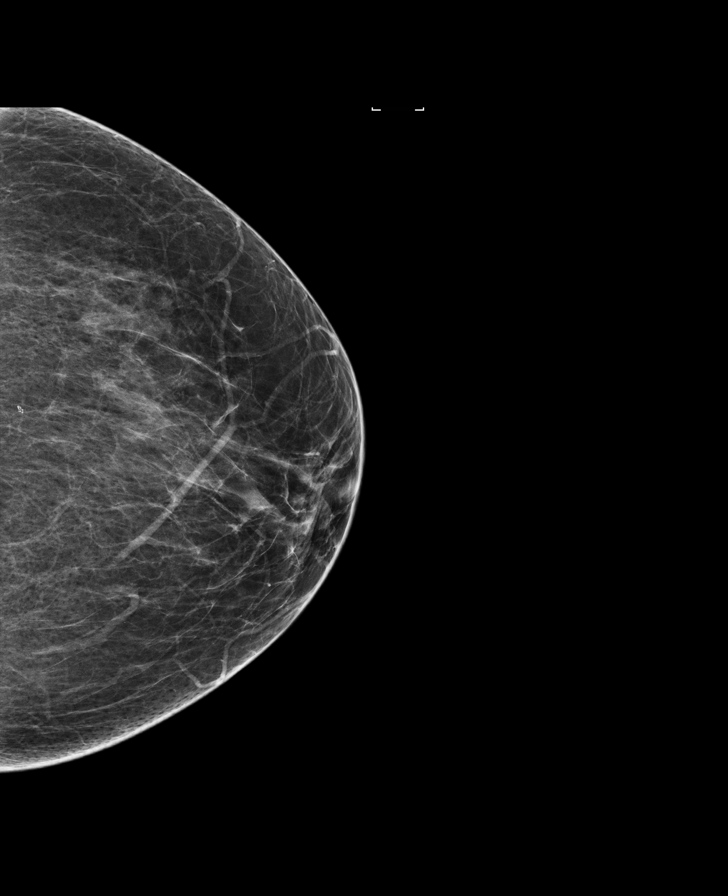

[R MLO (1 of 2)]
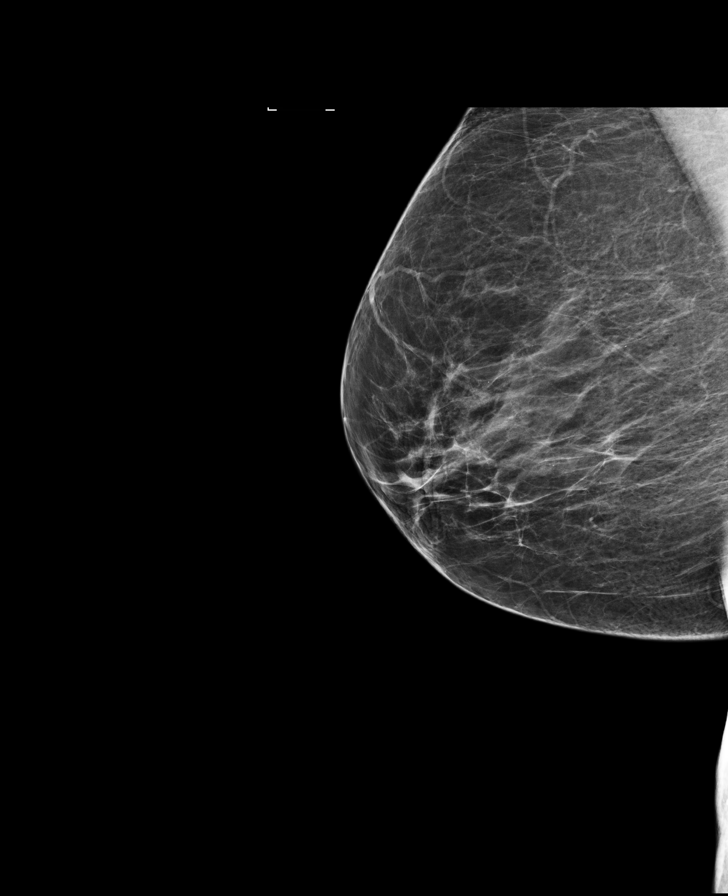

[L MLO (1 of 2)]
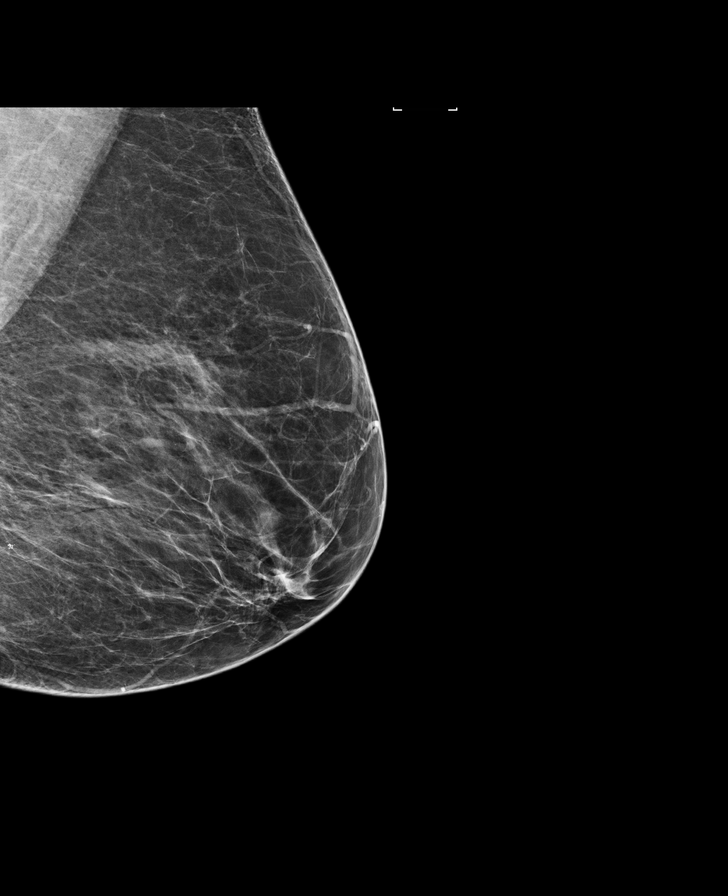

[R MLO (2 of 2)]
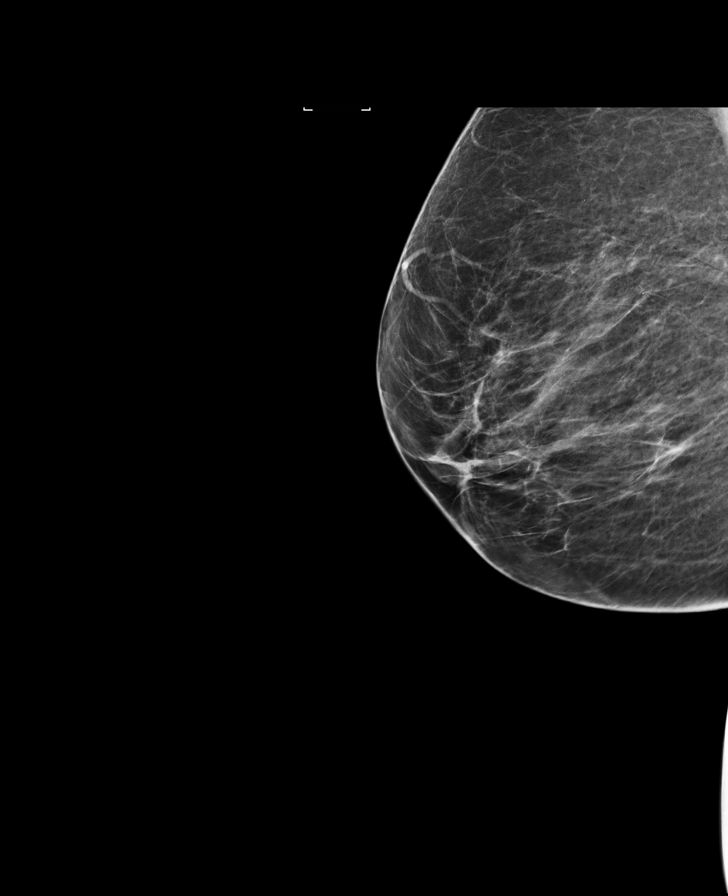

[R CC]
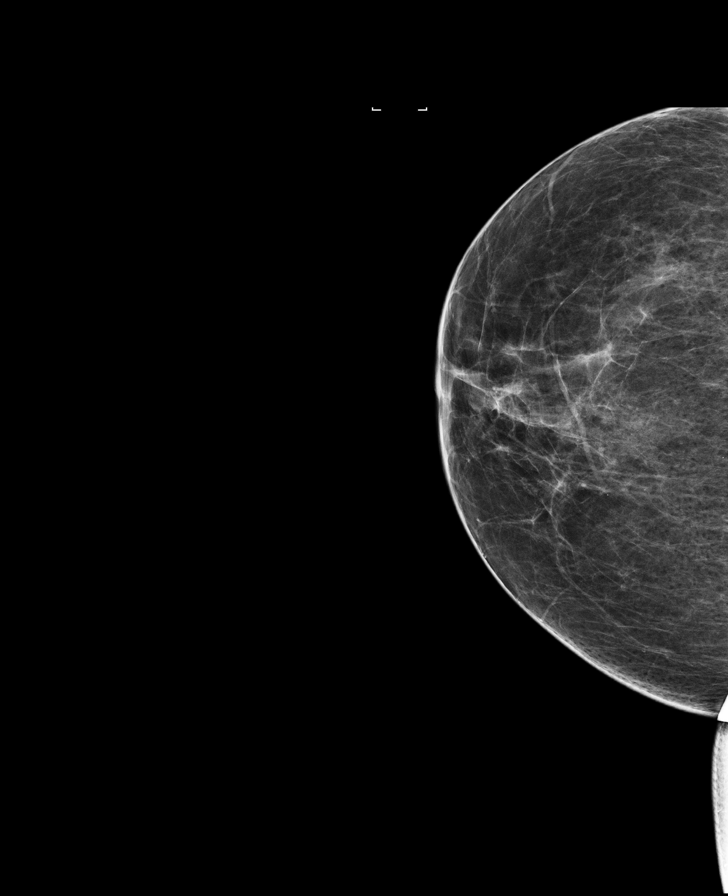

[L CC (2 of 2)]
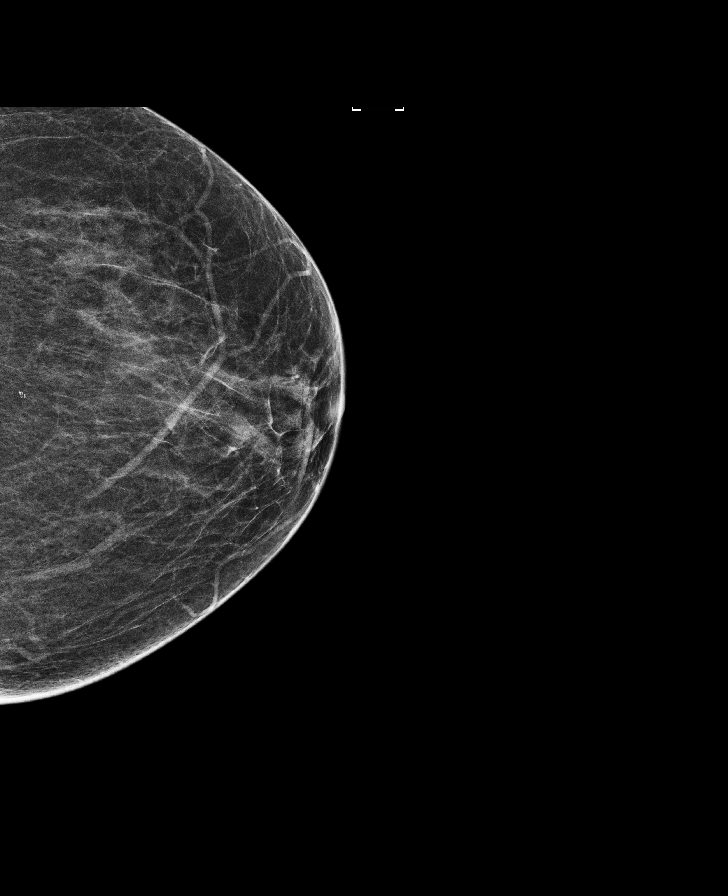

[L MLO (2 of 2)]
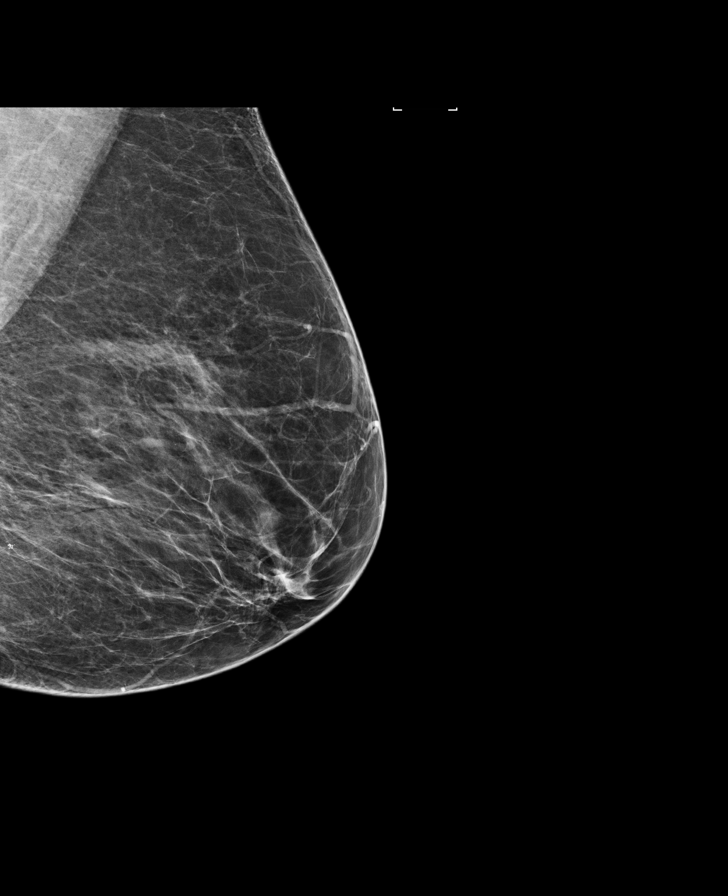

[L MLO tomo · tomo slice 31/62.0]
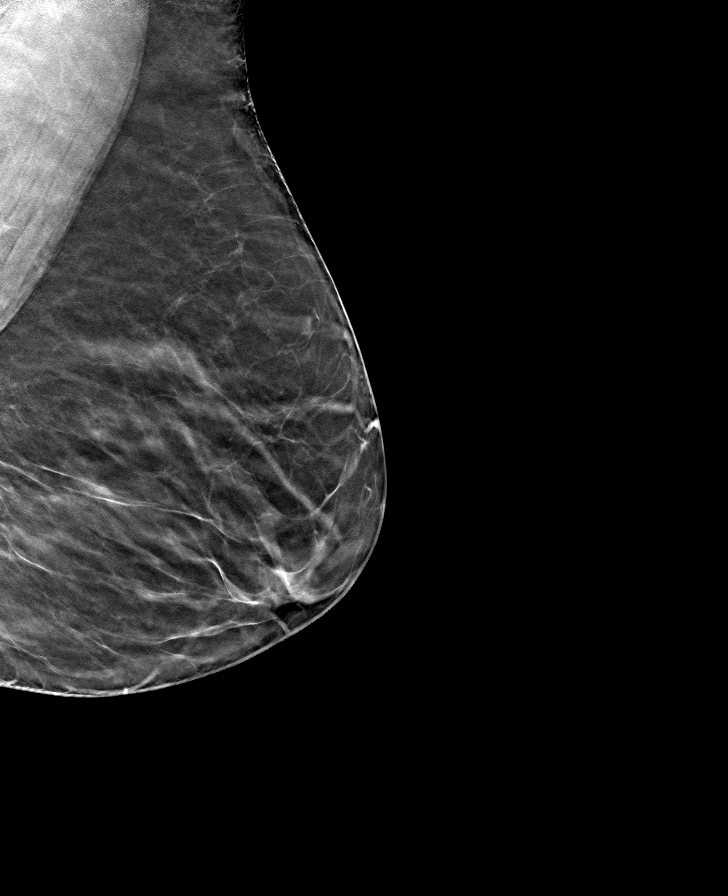

[8 of 32 positions shown; findings below may reference images not displayed]

ACR Breast Density Category b: There are scattered areas of
fibroglandular density.
FINDINGS: There are no findings suspicious for malignancy. Images were
processed with CAD.
IMPRESSION: No mammographic evidence of malignancy. A result letter of this
screening mammogram will be mailed directly to the patient.

RECOMMENDATION:
Screening mammogram in one year. (Code:97-6-RS4)

BI-RADS CATEGORY  1: Negative.

## 2018-10-25 ENCOUNTER — Encounter: Payer: Self-pay | Admitting: Family Medicine

## 2019-01-02 ENCOUNTER — Other Ambulatory Visit: Payer: Self-pay

## 2019-01-02 ENCOUNTER — Other Ambulatory Visit: Payer: PPO

## 2019-01-02 DIAGNOSIS — Z20822 Contact with and (suspected) exposure to covid-19: Secondary | ICD-10-CM

## 2019-01-02 DIAGNOSIS — R6889 Other general symptoms and signs: Secondary | ICD-10-CM | POA: Diagnosis not present

## 2019-01-03 ENCOUNTER — Encounter: Payer: Self-pay | Admitting: Family Medicine

## 2019-01-05 LAB — NOVEL CORONAVIRUS, NAA: SARS-CoV-2, NAA: NOT DETECTED

## 2019-02-12 ENCOUNTER — Encounter: Payer: Self-pay | Admitting: Family Medicine

## 2019-02-13 ENCOUNTER — Ambulatory Visit (INDEPENDENT_AMBULATORY_CARE_PROVIDER_SITE_OTHER): Payer: PPO | Admitting: Family Medicine

## 2019-02-13 ENCOUNTER — Encounter: Payer: Self-pay | Admitting: Family Medicine

## 2019-02-13 ENCOUNTER — Other Ambulatory Visit: Payer: Self-pay

## 2019-02-13 ENCOUNTER — Ambulatory Visit (HOSPITAL_COMMUNITY)
Admission: RE | Admit: 2019-02-13 | Discharge: 2019-02-13 | Disposition: A | Discharge status: Home / Self Care | Payer: PPO | Source: Ambulatory Visit | Attending: Family Medicine | Admitting: Family Medicine

## 2019-02-13 VITALS — BP 128/78 | HR 80 | Temp 98.0°F | Resp 15 | Ht 60.0 in | Wt 153.0 lb

## 2019-02-13 DIAGNOSIS — M79605 Pain in left leg: Secondary | ICD-10-CM | POA: Diagnosis not present

## 2019-02-13 DIAGNOSIS — R600 Localized edema: Secondary | ICD-10-CM | POA: Diagnosis not present

## 2019-02-13 HISTORY — DX: Pain in left leg: M79.605

## 2019-02-13 NOTE — Patient Instructions (Signed)
F/u as before, call if you need me before  Please get left lower [place] of leg when you leave to ensure nom clot  Thanks for choosing [facility], we consider it a privelige to serve you.   Social distancing. Frequent hand washing with soap and water Keeping your hands off of your face. These 3 practices will help to keep both you and your community healthy during this time. Please practice them faithfully!

## 2019-02-13 NOTE — Assessment & Plan Note (Addendum)
2 day h/o acute  left leg pain, occurring in same leg where she had a spontaneous extensive DVT with similar presentation, venous doppler today

## 2019-02-17 ENCOUNTER — Encounter: Payer: Self-pay | Admitting: Family Medicine

## 2019-02-17 NOTE — Progress Notes (Signed)
   Tracy Humphrey     MRN: [id number]      DOB: July 03, 1951   HPI Tracy Humphrey is here with a 2 day h/o acute intermittent left thigh pain and tingling, which initially kept her awake the initial night this occurred. She has no h/o trauma, redness, swelling or warmth. This is the same leg where she had a dVT several years ago wiith similar presentation and no underlying pathology was found She denies dyspnea, hemoptysis , cough or fever She contiues to walk regularly and practice good food choices   ROS Denies recent fever or chills. Denies sinus pressure, nasal congestion, ear pain or sore throat. Denies chest congestion, productive cough or wheezing. Denies chest pains, palpitations and leg swelling Denies abdominal pain, nausea, vomiting,diarrhea or constipation.   Denies dysuria, frequency, hesitancy or incontinence. Denies joint pain, swelling and limitation in mobility. Denies headaches, seizures, numbness, or tingling. Denies depression, anxiety or insomnia. Denies skin break down or rash.   PE  BP 128/78   Pulse 80   Temp 98 F (36.7 C) (Temporal)   Resp 15   Ht 5' (1.524 m)   Wt 153 lb (69.4 kg)   SpO2 97%   BMI 29.88 kg/m   Patient alert and oriented and in no cardiopulmonary distress.  HEENT: No facial asymmetry, EOMI,    Chest: Clear to auscultation bilaterally.  CVS: S1, S2 no murmurs, no S3.Regular rate.    Ext: No edema  MS: Adequate ROM spine, shoulders, hips and knees.  Skin: Intact, no ulcerations or rash noted.  Psych: Good eye contact, normal affect. Memory intact not anxious or depressed appearing.  CNS: CN 2-12 intact, power,  normal throughout.no focal deficits noted.   Assessment & Plan  Left leg pain 2 day h/o acute  left leg pain, occurring in same leg where she had a spontaneous extensive DVT with similar presentation, venous doppler today

## 2019-03-06 ENCOUNTER — Encounter: Payer: Self-pay | Admitting: Family Medicine

## 2019-03-07 ENCOUNTER — Telehealth: Payer: Self-pay

## 2019-03-07 DIAGNOSIS — E119 Type 2 diabetes mellitus without complications: Secondary | ICD-10-CM

## 2019-03-07 DIAGNOSIS — E8881 Metabolic syndrome: Secondary | ICD-10-CM

## 2019-03-07 DIAGNOSIS — E785 Hyperlipidemia, unspecified: Secondary | ICD-10-CM

## 2019-03-07 DIAGNOSIS — R700 Elevated erythrocyte sedimentation rate: Secondary | ICD-10-CM

## 2019-03-07 DIAGNOSIS — E669 Obesity, unspecified: Secondary | ICD-10-CM

## 2019-03-07 DIAGNOSIS — E559 Vitamin D deficiency, unspecified: Secondary | ICD-10-CM

## 2019-03-07 DIAGNOSIS — E663 Overweight: Secondary | ICD-10-CM

## 2019-03-07 NOTE — Telephone Encounter (Signed)
Labs entered to be drawn prior to visit

## 2019-03-07 NOTE — Telephone Encounter (Signed)
Cbc added to lab orders per md

## 2019-03-14 DIAGNOSIS — E663 Overweight: Secondary | ICD-10-CM | POA: Diagnosis not present

## 2019-03-14 DIAGNOSIS — E119 Type 2 diabetes mellitus without complications: Secondary | ICD-10-CM | POA: Diagnosis not present

## 2019-03-14 DIAGNOSIS — E785 Hyperlipidemia, unspecified: Secondary | ICD-10-CM | POA: Diagnosis not present

## 2019-03-14 DIAGNOSIS — E669 Obesity, unspecified: Secondary | ICD-10-CM | POA: Diagnosis not present

## 2019-03-15 LAB — COMPLETE METABOLIC PANEL WITH GFR
AG Ratio: 1.7 (calc) (ref 1.0–2.5)
ALT: 13 U/L (ref 6–29)
AST: 16 U/L (ref 10–35)
Albumin: 4.3 g/dL (ref 3.6–5.1)
Alkaline phosphatase (APISO): 86 U/L (ref 37–153)
BUN: 16 mg/dL (ref 7–25)
CO2: 29 mmol/L (ref 20–32)
Calcium: 9.7 mg/dL (ref 8.6–10.4)
Chloride: 105 mmol/L (ref 98–110)
Creat: 0.67 mg/dL (ref 0.60–0.93)
GFR, Est African American: 103 mL/min/{1.73_m2} (ref >=60)
GFR, Est Non African American: 89 mL/min/{1.73_m2} (ref >=60)
Globulin: 2.6 g/dL (calc) (ref 1.9–3.7)
Glucose, Bld: 99 mg/dL (ref 65–99)
Potassium: 4.4 mmol/L (ref 3.5–5.3)
Sodium: 144 mmol/L (ref 135–146)
Total Bilirubin: 0.6 mg/dL (ref 0.2–1.2)
Total Protein: 6.9 g/dL (ref 6.1–8.1)

## 2019-03-15 LAB — HEMOGLOBIN A1C
Hgb A1c MFr Bld: 5.9 % of total Hgb — ABNORMAL HIGH (ref <5.7)
Mean Plasma Glucose: 123 (calc)
eAG (mmol/L): 6.8 (calc)

## 2019-03-15 LAB — LIPID PANEL
Cholesterol: 211 mg/dL — ABNORMAL HIGH (ref <200)
HDL: 60 mg/dL (ref >=50)
LDL Cholesterol (Calc): 129 mg/dL (calc) — ABNORMAL HIGH
Non-HDL Cholesterol (Calc): 151 mg/dL (calc) — ABNORMAL HIGH (ref <130)
Total CHOL/HDL Ratio: 3.5 (calc) (ref <5.0)
Triglycerides: 111 mg/dL (ref <150)

## 2019-03-15 LAB — CBC
HCT: 42.5 % (ref 35.0–45.0)
Hemoglobin: 14.3 g/dL (ref 11.7–15.5)
MCH: 30 pg (ref 27.0–33.0)
MCHC: 33.6 g/dL (ref 32.0–36.0)
MCV: 89.1 fL (ref 80.0–100.0)
MPV: 12 fL (ref 7.5–12.5)
Platelets: 166 10*3/uL (ref 140–400)
RBC: 4.77 10*6/uL (ref 3.80–5.10)
RDW: 12.4 % (ref 11.0–15.0)
WBC: 8 10*3/uL (ref 3.8–10.8)

## 2019-03-15 LAB — VITAMIN D 25 HYDROXY (VIT D DEFICIENCY, FRACTURES): Vit D, 25-Hydroxy: 33 ng/mL (ref 30–100)

## 2019-03-15 LAB — TSH: TSH: 2.43 mIU/L (ref 0.40–4.50)

## 2019-03-16 ENCOUNTER — Encounter: Payer: Self-pay | Admitting: Family Medicine

## 2019-03-19 ENCOUNTER — Other Ambulatory Visit: Payer: Self-pay

## 2019-03-19 ENCOUNTER — Encounter: Payer: PPO | Admitting: Family Medicine

## 2019-03-19 ENCOUNTER — Encounter: Payer: Self-pay | Admitting: Family Medicine

## 2019-03-20 ENCOUNTER — Ambulatory Visit (INDEPENDENT_AMBULATORY_CARE_PROVIDER_SITE_OTHER): Payer: PPO

## 2019-03-20 DIAGNOSIS — Z23 Encounter for immunization: Secondary | ICD-10-CM | POA: Diagnosis not present

## 2019-04-06 ENCOUNTER — Encounter: Payer: Self-pay | Admitting: Family Medicine

## 2019-04-22 ENCOUNTER — Ambulatory Visit: Payer: PPO | Admitting: Orthopedic Surgery

## 2019-04-22 ENCOUNTER — Other Ambulatory Visit: Payer: Self-pay

## 2019-04-22 ENCOUNTER — Encounter: Payer: Self-pay | Admitting: Orthopedic Surgery

## 2019-04-22 ENCOUNTER — Ambulatory Visit: Payer: PPO

## 2019-04-22 VITALS — BP 143/93 | HR 80 | Ht 60.0 in | Wt 155.0 lb

## 2019-04-22 DIAGNOSIS — M5442 Lumbago with sciatica, left side: Secondary | ICD-10-CM

## 2019-04-22 NOTE — Progress Notes (Signed)
Tracy Humphrey  04/22/2019  HISTORY SECTION :  Chief Complaint  Patient presents with  . Leg Pain    left thigh since injection 1974 off and on    This is a 68 year old female avid walker 5 to 7 miles a day presents with nonspecific pain in her left leg which began back in 1974 when she received an injection in her left thigh at [facility].  They felt that they hit her nerve and that she might have continued problems in that area.  1-2 times a year the area would get hot but it was relieved by NSAIDs as she did not pay much attention until [place] she had a burning episode again in the left thigh went for an ultrasound had a DVT and was found to have a superficial femoral vein thrombosis which was treated successfully  6 to 8 weeks ago she felt pain although it was not burning in the left thigh radiating from approximate mid thigh to proximal third of her shin which is worse when she is lying down at night does not bother her when she is walking.  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  All other systems reviewed and are negative.    has a past medical history of Allergy, BPV (benign positional vertigo) (11/04/2011), GERD (gastroesophageal reflux disease), Helicobacter pylori ab+ (2014), Hyperlipidemia (2013), IGT (impaired glucose tolerance) (2013), Meniere disease, Mild diastolic dysfunction (54/5625), Obesity (2007 ), Superficial thrombophlebitis (2015), and Vertigo.   Past Surgical History:  Procedure Laterality Date  . BREAST BIOPSY Right    cyst  . CATARACT EXTRACTION W/PHACO Left 07/21/2014   Procedure: CATARACT EXTRACTION PHACO AND INTRAOCULAR LENS PLACEMENT (IOC);  Surgeon: Elta Guadeloupe T. Gershon Crane, MD;  Location: [facility];  Service:  Ophthalmology;  Laterality: Left;  CDE 3.91  . CATARACT EXTRACTION W/PHACO Right 08/04/2014   Procedure: CATARACT EXTRACTION PHACO AND INTRAOCULAR LENS PLACEMENT; CDE:  5.38;  Surgeon: Elta Guadeloupe T. Gershon Crane, MD;  Location: [facility];  Service: Ophthalmology;  Laterality: Right;  . CESAREAN SECTION  29 years ago   . COLONOSCOPY  02/13/2005   Dr. Gala Romney- normal rectum, normal colon  . COLONOSCOPY N/A 01/01/2018   Procedure: COLONOSCOPY;  Surgeon: Daneil Dolin, MD;  Location: [facility];  Service: Endoscopy;  Laterality: N/A;  1:00  . ESOPHAGOGASTRODUODENOSCOPY N/A 06/26/2013   Procedure: ESOPHAGOGASTRODUODENOSCOPY (EGD);  Surgeon: Daneil Dolin, MD;  Location: [facility];  Service: Endoscopy;  Laterality: N/A;  8:00  . EYE SURGERY Bilateral 2015   cataract extraction  . Right foot surgery 2003. 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   Allergies  Allergen Reactions  . Levaquin [Levofloxacin In D5w] Anaphylaxis    Generalized pain  . 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 Penicillins Rash    .Has patient had a PCN reaction causing immediate rash, facial/tongue/throat swelling, SOB or lightheadedness with hypotension: Yes Has patient had a PCN reaction causing severe rash involving mucus membranes or skin necrosis: No Has patient had a PCN reaction that required hospitalization: No Has patient had a PCN reaction occurring within the last  10 years: No If all of the above answers are "NO", then may proceed with Cephalosporin use.      Current Outpatient Medications:  .  aspirin (ASPIRIN LOW DOSE) 81 MG EC tablet, Take 81 mg by mouth daily.  , Disp: , Rfl:  .  calcium carbonate (TUMS - DOSED IN MG ELEMENTAL CALCIUM) 500 MG chewable tablet, Chew 2 tablets by mouth daily.  , Disp: , Rfl:  .  Isopropyl Alcohol (SWIMMERS EAR DROPS) 95 % LIQD, Place 2 drops into both ears once a  week. , Disp: , Rfl:  .  loratadine (CLARITIN) 10 MG tablet, Take 1 tablet (10 mg total) by mouth daily., Disp: 90 tablet, Rfl: 3 .  naproxen sodium (ALEVE) 220 MG tablet, Take 220 mg by mouth daily as needed (pain). , Disp: , Rfl:  .  phenylephrine (SUDAFED PE) 10 MG TABS tablet, Take 10 mg by mouth daily., Disp: , Rfl:  .  Multiple Vitamin (TAB-A-VITE) TABS, 1 tablet daily, Disp: 90 tablet, Rfl: 3   PHYSICAL EXAM SECTION: 1) BP (!) 143/93   Pulse 80   Ht 5\' 2"  (1.575 m)   Wt 155 lb (70.3 kg)   BMI 28.35 kg/m   Body mass index is 28.35 kg/m. General appearance: Well-developed well-nourished no gross deformities  2) Cardiovascular normal pulse and perfusion in the lower extremities normal color without edema  3) Neurologically deep tendon reflexes are equal and normal, no sensation loss or deficits no pathologic reflexes  4) Psychological: Awake alert and oriented x3 mood and affect normal  5) Skin no lacerations or ulcerations no nodularity no palpable masses, no erythema or nodularity  6) Musculoskeletal:   Nontender lumbar spine excellent flexion no pain with flexion or extension no tenderness to palpation  Both lower extremities have normal and equal length Normal muscle tone All joints reduced bilaterally  Straight leg raise was normal on the right  Straight leg raise reproduce some discomfort on the left and there was tenderness in the anterior compartment of the leg near the shin   MEDICAL DECISION SECTION:  Encounter Diagnosis  Name Primary?  . Left-sided low back pain with left-sided sciatica, unspecified chronicity Yes    Imaging Lumbar spine imaging see report dictated impression is facet arthritis L4-S1 mild to moderate  Plan:  (Rx., Inj., surg., Frx, MRI/CT, XR:2)  Normal activity  Keep walking OTC for pain  Sleep on her side   2:34 PM , MD  04/22/2019

## 2019-04-22 NOTE — Patient Instructions (Addendum)
Normal activity  Keep walking OTC for pain

## 2019-04-23 ENCOUNTER — Encounter: Payer: Self-pay | Admitting: Orthopedic Surgery

## 2019-04-23 ENCOUNTER — Telehealth: Payer: Self-pay | Admitting: Radiology

## 2019-04-23 NOTE — Telephone Encounter (Signed)
-----   Message from Uvaldo Bristle sent at 04/23/2019 11:16 AM EDT ----- Regarding: patient question about height on visit summary Patient Tracy Humphrey MR [id number] relays that her height noted on AVS 04/22/19 reads  5'2", and states her height is 5'; asked if it can please be corrected.

## 2019-04-23 NOTE — Telephone Encounter (Signed)
I have corrected, thanks

## 2019-05-15 ENCOUNTER — Other Ambulatory Visit (HOSPITAL_COMMUNITY): Payer: Self-pay | Admitting: Family Medicine

## 2019-05-15 DIAGNOSIS — Z1231 Encounter for screening mammogram for malignant neoplasm of breast: Secondary | ICD-10-CM

## 2019-06-04 ENCOUNTER — Other Ambulatory Visit: Payer: Self-pay

## 2019-06-04 DIAGNOSIS — Z20822 Contact with and (suspected) exposure to covid-19: Secondary | ICD-10-CM

## 2019-06-05 ENCOUNTER — Encounter: Payer: Self-pay | Admitting: Family Medicine

## 2019-06-05 LAB — NOVEL CORONAVIRUS, NAA: SARS-CoV-2, NAA: NOT DETECTED

## 2019-06-06 ENCOUNTER — Encounter: Payer: Self-pay | Admitting: Family Medicine

## 2019-06-30 ENCOUNTER — Ambulatory Visit (HOSPITAL_COMMUNITY)
Admission: RE | Admit: 2019-06-30 | Discharge: 2019-06-30 | Disposition: A | Discharge status: Home / Self Care | Payer: PPO | Source: Ambulatory Visit | Attending: Family Medicine | Admitting: Family Medicine

## 2019-06-30 ENCOUNTER — Other Ambulatory Visit: Payer: Self-pay

## 2019-06-30 DIAGNOSIS — Z1231 Encounter for screening mammogram for malignant neoplasm of breast: Secondary | ICD-10-CM | POA: Diagnosis not present

## 2019-07-02 ENCOUNTER — Encounter: Payer: PPO | Admitting: Family Medicine

## 2019-07-06 ENCOUNTER — Encounter: Payer: Self-pay | Admitting: Family Medicine

## 2019-07-07 ENCOUNTER — Ambulatory Visit
Admission: EM | Admit: 2019-07-07 | Discharge: 2019-07-07 | Disposition: A | Discharge status: Home / Self Care | Payer: PPO | Attending: Emergency Medicine | Admitting: Emergency Medicine

## 2019-07-07 ENCOUNTER — Other Ambulatory Visit: Payer: Self-pay

## 2019-07-07 DIAGNOSIS — Z20828 Contact with and (suspected) exposure to other viral communicable diseases: Secondary | ICD-10-CM

## 2019-07-07 DIAGNOSIS — R05 Cough: Secondary | ICD-10-CM

## 2019-07-07 DIAGNOSIS — R0782 Intercostal pain: Secondary | ICD-10-CM

## 2019-07-07 DIAGNOSIS — Z7689 Persons encountering health services in other specified circumstances: Secondary | ICD-10-CM | POA: Diagnosis not present

## 2019-07-07 DIAGNOSIS — R0981 Nasal congestion: Secondary | ICD-10-CM

## 2019-07-07 DIAGNOSIS — Z20822 Contact with and (suspected) exposure to covid-19: Secondary | ICD-10-CM

## 2019-07-07 DIAGNOSIS — R059 Cough, unspecified: Secondary | ICD-10-CM

## 2019-07-07 HISTORY — DX: Unspecified osteoarthritis, unspecified site: M19.90

## 2019-07-07 NOTE — Discharge Instructions (Addendum)
COVID testing ordered.  It will take between 5-7 days for test results.  Someone will contact you regarding abnormal results.    In the meantime: You should remain isolated in your home for 10 days from symptom onset  Get plenty of rest and push fluids Tessalon Perles prescribed for cough Flonase prescribed for nasal congestion and runny nose Use medications daily for symptom relief Use OTC medications like ibuprofen or tylenol as needed fever or pain Call or go to the ED if you have any new or worsening symptoms such as fever, worsening cough, shortness of breath, chest tightness, chest pain, turning blue, changes in mental status, etc..Marland Kitchen

## 2019-07-07 NOTE — ED Triage Notes (Signed)
Pt presents to UC w/ c/o stabbing pain under left ribs x5 days. Pt states she feels it at times when getting up from sitting and it is worse when she lies down. Pt also states she's had some congestion and cough x4 days.

## 2019-07-07 NOTE — ED Provider Notes (Signed)
[facility]    CSN: [id number] Arrival date & time: 07/07/19  8299      History   Chief Complaint Chief Complaint  Patient presents with  . left rib pain    HPI Tracy Humphrey is a 68 y.o. female.   Tracy Humphrey 32 y old female presented to the urgent care with a complaint of left-sided rib cage pain, cough and congestion for the past 4 to 5 days.  Denies sick exposure to COVID, flu or strep.  Denies recent travel.  Denies aggravating or alleviating symptoms.  Denies previous COVID infection.   Denies fever, chills, fatigue, rhinorrhea, sore throat,  SOB, wheezing, chest pain, nausea, vomiting, changes in bowel or bladder habits.      The history is provided by the patient. No language interpreter was used.    Past Medical History:  Diagnosis Date  . Allergy    prrenial  . Arthritis   . BPV (benign positional vertigo) 11/04/2011  . GERD (gastroesophageal reflux disease)   . Helicobacter pylori ab+ 2014   treated by GI  . Hyperlipidemia 2013  . IGT (impaired glucose tolerance) 2013  . Meniere disease   . Mild diastolic dysfunction 06/2013   grade 2  . Obesity 2007    Prior to this had no weight issue   . Superficial thrombophlebitis 2015  . Vertigo     Patient Active Problem List   Diagnosis Date Noted  . Left leg pain 02/13/2019  . Polymyalgia rheumatica (HCC) 06/14/2017  . Elevated sedimentation rate 06/07/2017  . Arthritis 06/07/2017  . Seasonal allergies 11/13/2013  . Overweight (BMI 25.0-29.9) 06/15/2013  . Diabetes mellitus type 2, diet-controlled (HCC) 10/19/2011  . Dyslipidemia, goal LDL below 100 10/19/2011  . Prediabetes 10/19/2011  . VERTIGO 05/25/2010    Past Surgical History:  Procedure Laterality Date  . BREAST BIOPSY Right    cyst  . CATARACT EXTRACTION W/PHACO Left 07/21/2014   Procedure: CATARACT EXTRACTION PHACO AND INTRAOCULAR LENS PLACEMENT (IOC);  Surgeon: Loraine Leriche T. Nile Riggs, MD;  Location: [facility];  Service: Ophthalmology;   Laterality: Left;  CDE 3.91  . CATARACT EXTRACTION W/PHACO Right 08/04/2014   Procedure: CATARACT EXTRACTION PHACO AND INTRAOCULAR LENS PLACEMENT; CDE:  5.38;  Surgeon: Loraine Leriche T. Nile Riggs, MD;  Location: [facility];  Service: Ophthalmology;  Laterality: Right;  . CESAREAN SECTION  29 years ago   . COLONOSCOPY  02/13/2005   Dr. Jena Gauss- normal rectum, normal colon  . COLONOSCOPY N/A 01/01/2018   Procedure: COLONOSCOPY;  Surgeon: Corbin Ade, MD;  Location[facility];  Service: Endoscopy;  Laterality: N/A;  1:00  . ESOPHAGOGASTRODUODENOSCOPY N/A 06/26/2013   Procedure: ESOPHAGOGASTRODUODENOSCOPY (EGD);  Surgeon: Corbin Ade, MD;  Location: [facility];  Service: Endoscopy;  Laterality: N/A;  8:00  . EYE SURGERY Bilateral 2015   cataract extraction  . Right foot surgery 2003. 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      Home Medications    Prior to Admission medications   Medication Sig Start Date End Date Taking? Authorizing Provider  aspirin (ASPIRIN LOW DOSE) 81 MG EC tablet Take 81 mg by mouth daily.      [provider]  calcium carbonate (TUMS - DOSED IN MG ELEMENTAL CALCIUM) 500 MG chewable tablet Chew 2 tablets by mouth daily.      [provider]  Isopropyl Alcohol (  SWIMMERS EAR DROPS) 95 % LIQD Place 2 drops into both ears once a week.     [provider]  loratadine (CLARITIN) 10 MG tablet Take 1 tablet (10 mg total) by mouth daily. 02/03/16   Fayrene Helper, MD  Multiple Vitamin (TAB-A-VITE) TABS 1 tablet daily 10/26/15   Fayrene Helper, MD  naproxen sodium (ALEVE) 220 MG tablet Take 220 mg by mouth daily as needed (pain).     [provider]  phenylephrine (SUDAFED PE) 10 MG TABS tablet Take 10 mg by mouth daily.    [provider]    Family History Family History  Problem Relation Age of Onset  . Dementia Mother 33  . Hypertension Mother    . Hypertension Father   . Heart disease Father   . Diabetes Father   . AAA (abdominal aortic aneurysm) Father   . Hypertension Sister   . Coronary artery disease Sister 58  . Hypertension Brother 34  . Alcohol abuse Brother   . AAA (abdominal aortic aneurysm) Paternal Grandfather     Social History Social History   Tobacco Use  . Smoking status: Never Smoker  . Smokeless tobacco: Never Used  . Tobacco comment: Never smoke  Substance Use Topics  . Alcohol use: No  . Drug use: No     Allergies   Levaquin [levofloxacin in d5w], Prednisone, and Penicillins   Review of Systems Review of Systems  Constitutional: Negative.   HENT: Positive for congestion.   Respiratory: Positive for cough.   Cardiovascular: Negative.   Gastrointestinal: Negative.   Musculoskeletal:       Rib cage pain  Neurological: Negative.      Physical Exam Triage Vital Signs ED Triage Vitals  Enc Vitals Group     BP 07/07/19 0842 (!) 166/76     Pulse Rate 07/07/19 0842 90     Resp 07/07/19 0842 16     Temp 07/07/19 0842 98.5 F (36.9 C)     Temp Source 07/07/19 0842 Oral     SpO2 07/07/19 0842 98 %     Weight --      Height --      Head Circumference --      Peak Flow --      Pain Score 07/07/19 0903 1     Pain Loc --      Pain Edu? --      Excl. in [place]? --    No data found.  Updated Vital Signs BP (!) 166/76 (BP Location: Right Arm)   Pulse 90   Temp 98.5 F (36.9 C) (Oral)   Resp 16   SpO2 98%   Visual Acuity Right Eye Distance:   Left Eye Distance:   Bilateral Distance:    Right Eye Near:   Left Eye Near:    Bilateral Near:     Physical Exam Vitals and nursing note reviewed.  Constitutional:      General: She is not in acute distress.    Appearance: Normal appearance. She is normal weight. She is not ill-appearing or toxic-appearing.  HENT:     Head: Normocephalic.     Right Ear: Tympanic membrane, ear canal and external ear normal. There is no impacted  cerumen.     Left Ear: Tympanic membrane, ear canal and external ear normal. There is no impacted cerumen.     Nose: Nose normal. No congestion.     Mouth/Throat:     Mouth: Mucous membranes are moist.  Pharynx: No oropharyngeal exudate or posterior oropharyngeal erythema.  Cardiovascular:     Rate and Rhythm: Normal rate and regular rhythm.     Pulses: Normal pulses.     Heart sounds: Normal heart sounds. No murmur.  Pulmonary:     Effort: Pulmonary effort is normal. No respiratory distress.     Breath sounds: No wheezing or rhonchi.  Chest:     Chest wall: No tenderness.  Abdominal:     General: Abdomen is flat. Bowel sounds are normal. There is no distension.     Palpations: There is no mass.  Skin:    Capillary Refill: Capillary refill takes less than 2 seconds.  Neurological:     Mental Status: She is alert and oriented to person, place, and time.      UC Treatments / Results  Labs (all labs ordered are listed, but only abnormal results are displayed) Labs Reviewed  NOVEL CORONAVIRUS, NAA    EKG   Radiology No results found.  Procedures Procedures (including critical care time)  Medications Ordered in UC Medications - No data to display  Initial Impression / Assessment and Plan / UC Course  I have reviewed the triage vital signs and the nursing notes.  Pertinent labs & imaging results that were available during my care of the patient were reviewed by me and considered in my medical decision making (see chart for details).   Patient stable for discharge.  Benign physical exam. Advised patient to quarantine until COVID-19 results become available.  To go to ED for worsening of symptoms.  Patient verbalized understanding of the plan of care.  Final Clinical Impressions(s) / UC Diagnoses   Final diagnoses:  Suspected COVID-19 virus infection  Cough     Discharge Instructions     COVID testing ordered.  It will take between 5-7 days for test results.   Someone will contact you regarding abnormal results.    In the meantime: You should remain isolated in your home for 10 days from symptom onset  Get plenty of rest and push fluids Tessalon Perles prescribed for cough Flonase prescribed for nasal congestion and runny nose Use medications daily for symptom relief Use OTC medications like ibuprofen or tylenol as needed fever or pain Call or go to the ED if you have any new or worsening symptoms such as fever, worsening cough, shortness of breath, chest tightness, chest pain, turning blue, changes in mental status, etc...     ED Prescriptions    None     PDMP not reviewed this encounter.   Durward Parcelvegno, Durwin Davisson S, FNP 07/07/19 [id number]

## 2019-07-08 ENCOUNTER — Telehealth: Payer: Self-pay | Admitting: *Deleted

## 2019-07-08 NOTE — Telephone Encounter (Signed)
Patient called for results ,still pending . 

## 2019-07-09 ENCOUNTER — Telehealth: Payer: Self-pay

## 2019-07-09 LAB — NOVEL CORONAVIRUS, NAA: SARS-CoV-2, NAA: DETECTED — AB

## 2019-07-09 NOTE — Telephone Encounter (Signed)
noted 

## 2019-07-09 NOTE — Telephone Encounter (Signed)
Changed CPE to 2/4 and she declined in home monitoring because she's not having any problems and nothing to report other than a runny nose and she is taking sudafed and claritin but she appreciates the offer

## 2019-07-09 NOTE — Telephone Encounter (Signed)
Need her appt in office for physical, tomorrow rescheduled  to 3rd or 4 th week in January. I recommend changing  to a phone visit for her with me re covid positive dx and to get her hooked up to pt in home monitoring  etc , pls discuss and verify with her

## 2019-07-09 NOTE — Telephone Encounter (Signed)
Pt notified of positive COVID-19 test results. Pt verbalized understanding. Pt reports that she has nasal congestion.Pt advised to remain in self quarantine until at least 10 days since symptom onset And 3 consecutive days fever free without antipyretics And improvement in respiratory symptoms. Patient advised to utilize over the counter medications to treat symptoms. Pt advised to seek treatment in the ED if respiratory issues/distress develops.Pt advised they should only leave home to seek and medical care and must wear a mask in public. Pt instructed to limit contact with family members or caregivers in the home. Pt advised to practice social distancing and to continue to use good preventative care measures such has frequent hand washing, staying out of crowds and cleaning hard surfaces frequently touched in the home.Pt informed that the health department will likely follow up and may have additional recommendations. Will notify [facility].

## 2019-07-09 NOTE — Telephone Encounter (Signed)
Pt is calling to advise that she is Covid + but she is doing ok.  I advised pt to call us if she needed anything

## 2019-07-10 ENCOUNTER — Encounter: Payer: PPO | Admitting: Family Medicine

## 2019-07-16 ENCOUNTER — Encounter: Payer: Self-pay | Admitting: Family Medicine

## 2019-07-24 ENCOUNTER — Encounter: Payer: Self-pay | Admitting: Family Medicine

## 2019-08-14 ENCOUNTER — Other Ambulatory Visit: Payer: Self-pay

## 2019-08-14 ENCOUNTER — Ambulatory Visit (INDEPENDENT_AMBULATORY_CARE_PROVIDER_SITE_OTHER): Payer: PPO | Admitting: Family Medicine

## 2019-08-14 ENCOUNTER — Encounter: Payer: Self-pay | Admitting: Family Medicine

## 2019-08-14 DIAGNOSIS — Z0000 Encounter for general adult medical examination without abnormal findings: Secondary | ICD-10-CM | POA: Diagnosis not present

## 2019-08-14 NOTE — Assessment & Plan Note (Signed)

## 2019-08-14 NOTE — Patient Instructions (Signed)
F/u in office weight and lab re check in end August, with MD, call if you need me sooner week in Augu Please get fasting cBC, lipid, cmp and eGFr, hBA1C, tSH and vit D last week in august  It is important that you exercise regularly at least 30 minutes 5 times a week. If you develop chest pain, have severe difficulty breathing, or feel very tired, stop exercising immediately and seek medical attention   Think about what you will eat, plan ahead. Choose " clean, green, fresh or frozen" over canned, processed or packaged foods which are more sugary, salty and fatty. 70 to 75% of food eaten should be vegetables and fruit. Three meals at set times with snacks allowed between meals, but they must be fruit or vegetables. Aim to eat over a 12 hour period , example 7 am to 7 pm, and STOP after  your last meal of the day. Drink water,generally about 64 ounces per day, no other drink is as healthy. Fruit juice is best enjoyed in a healthy way, by EATING the fruit.   Thanks for choosing [facility], we consider it a privelige to serve you.

## 2019-08-17 ENCOUNTER — Encounter: Payer: Self-pay | Admitting: Family Medicine

## 2019-08-17 NOTE — Progress Notes (Signed)
    Tracy Humphrey     MRN: [id number]      DOB: Dec 14, 1950  HPI: Patient is in for annual physical exam. No other health concerns are expressed or addressed at the visit. Recent labs,e are reviewed. Immunization is reviewed , and is up to date  PE: BP 136/78 (BP Location: Left Arm, Patient Position: Sitting)   Pulse 92   Temp 98.9 F (37.2 C) (Temporal)   Ht 5' (1.524 m)   Wt 160 lb (72.6 kg)   SpO2 99%   BMI 31.25 kg/m   Pleasant  female, alert and oriented x 3, in no cardio-pulmonary distress. Afebrile. HEENT No facial trauma or asymetry. Sinuses non tender.  Extra occullar muscles intact.. External ears normal, . Neck: supple, no adenopathy,JVD or thyromegaly.No bruits.  Chest: Clear to ascultation bilaterally.No crackles or wheezes. Non tender to palpation  Breast: No  Physical exam Normal mammogram 06/2019  Cardiovascular system; Heart sounds normal,  S1 and  S2 ,no S3.  No murmur, or thrill. Apical beat not displaced Peripheral pulses normal.  Abdomen: Soft, non tender, no organomegaly or masses. No bruits. Bowel sounds normal. No guarding, tenderness or rebound.    Musculoskeletal exam: Full ROM of spine, hips , shoulders and knees. No deformity ,swelling or crepitus noted. No muscle wasting or atrophy.   Neurologic: Cranial nerves 2 to 12 intact. Power, t normal throughout. No disturbance in gait. No tremor.  Skin: Intact, no ulceration, erythema , scaling or rash noted. Pigmentation normal throughout  Psych; Normal mood and affect. Judgement and concentration normal   Assessment & Plan:  Annual physical exam Annual exam as documented. Counseling done  re healthy lifestyle involving commitment to 150 minutes exercise per week, heart healthy diet, and attaining healthy weight.The importance of adequate sleep also discussed. Regular seat belt use and home safety, is also discussed. Changes in health habits are decided on by the patient with  goals and time frames  set for achieving them. Immunization and cancer screening needs are specifically addressed at this visit.

## 2019-09-11 ENCOUNTER — Encounter: Payer: Self-pay | Admitting: Family Medicine

## 2020-02-18 ENCOUNTER — Encounter: Payer: Self-pay | Admitting: Family Medicine

## 2020-02-19 ENCOUNTER — Other Ambulatory Visit: Payer: Self-pay

## 2020-02-19 DIAGNOSIS — E785 Hyperlipidemia, unspecified: Secondary | ICD-10-CM

## 2020-02-19 DIAGNOSIS — E119 Type 2 diabetes mellitus without complications: Secondary | ICD-10-CM

## 2020-02-19 DIAGNOSIS — E8881 Metabolic syndrome: Secondary | ICD-10-CM

## 2020-02-19 DIAGNOSIS — E559 Vitamin D deficiency, unspecified: Secondary | ICD-10-CM

## 2020-02-19 NOTE — Telephone Encounter (Signed)
No labs have been ordered in the last 6 months what would you like to order for pt?

## 2020-02-20 DIAGNOSIS — E8881 Metabolic syndrome: Secondary | ICD-10-CM | POA: Diagnosis not present

## 2020-02-20 DIAGNOSIS — E119 Type 2 diabetes mellitus without complications: Secondary | ICD-10-CM | POA: Diagnosis not present

## 2020-02-20 DIAGNOSIS — E559 Vitamin D deficiency, unspecified: Secondary | ICD-10-CM | POA: Diagnosis not present

## 2020-02-20 DIAGNOSIS — E785 Hyperlipidemia, unspecified: Secondary | ICD-10-CM | POA: Diagnosis not present

## 2020-02-21 LAB — CBC
Hematocrit: 44.6 % (ref 34.0–46.6)
Hemoglobin: 14.9 g/dL (ref 11.1–15.9)
MCH: 30.3 pg (ref 26.6–33.0)
MCHC: 33.4 g/dL (ref 31.5–35.7)
MCV: 91 fL (ref 79–97)
Platelets: 205 10*3/uL (ref 150–450)
RBC: 4.92 x10E6/uL (ref 3.77–5.28)
RDW: 12.4 % (ref 11.7–15.4)
WBC: 8.8 10*3/uL (ref 3.4–10.8)

## 2020-02-21 LAB — CMP14+EGFR
ALT: 19 IU/L (ref 0–32)
AST: 17 IU/L (ref 0–40)
Albumin/Globulin Ratio: 1.6 (ref 1.2–2.2)
Albumin: 4.6 g/dL (ref 3.7–4.7)
Alkaline Phosphatase: 112 IU/L (ref 48–121)
BUN/Creatinine Ratio: 31 — ABNORMAL HIGH (ref 12–28)
BUN: 22 mg/dL (ref 8–27)
Bilirubin Total: 0.8 mg/dL (ref 0.0–1.2)
CO2: 26 mmol/L (ref 20–29)
Calcium: 9.9 mg/dL (ref 8.7–10.3)
Chloride: 102 mmol/L (ref 96–106)
Creatinine, Ser: 0.71 mg/dL (ref 0.57–1.00)
GFR calc Af Amer: 99 mL/min/{1.73_m2} (ref >59)
GFR calc non Af Amer: 86 mL/min/{1.73_m2} (ref >59)
Globulin, Total: 2.9 g/dL (ref 1.5–4.5)
Glucose: 123 mg/dL — ABNORMAL HIGH (ref 65–99)
Potassium: 4.9 mmol/L (ref 3.5–5.2)
Sodium: 141 mmol/L (ref 134–144)
Total Protein: 7.5 g/dL (ref 6.0–8.5)

## 2020-02-21 LAB — HEMOGLOBIN A1C
Est. average glucose Bld gHb Est-mCnc: 128 mg/dL
Hgb A1c MFr Bld: 6.1 % — ABNORMAL HIGH (ref 4.8–5.6)

## 2020-02-21 LAB — TSH: TSH: 1.55 u[IU]/mL (ref 0.450–4.500)

## 2020-02-21 LAB — LIPID PANEL
Chol/HDL Ratio: 4.3 ratio (ref 0.0–4.4)
Cholesterol, Total: 226 mg/dL — ABNORMAL HIGH (ref 100–199)
HDL: 52 mg/dL (ref >39)
LDL Chol Calc (NIH): 151 mg/dL — ABNORMAL HIGH (ref 0–99)
Triglycerides: 131 mg/dL (ref 0–149)
VLDL Cholesterol Cal: 23 mg/dL (ref 5–40)

## 2020-02-21 LAB — VITAMIN D 25 HYDROXY (VIT D DEFICIENCY, FRACTURES): Vit D, 25-Hydroxy: 39.2 ng/mL (ref 30.0–100.0)

## 2020-03-03 ENCOUNTER — Ambulatory Visit (INDEPENDENT_AMBULATORY_CARE_PROVIDER_SITE_OTHER): Payer: PPO | Admitting: Family Medicine

## 2020-03-03 ENCOUNTER — Other Ambulatory Visit: Payer: Self-pay

## 2020-03-03 ENCOUNTER — Encounter: Payer: Self-pay | Admitting: Family Medicine

## 2020-03-03 VITALS — BP 157/78 | HR 80 | Resp 16 | Ht 60.0 in | Wt 148.0 lb

## 2020-03-03 DIAGNOSIS — Z1231 Encounter for screening mammogram for malignant neoplasm of breast: Secondary | ICD-10-CM

## 2020-03-03 DIAGNOSIS — E663 Overweight: Secondary | ICD-10-CM

## 2020-03-03 DIAGNOSIS — E785 Hyperlipidemia, unspecified: Secondary | ICD-10-CM | POA: Diagnosis not present

## 2020-03-03 DIAGNOSIS — Z780 Asymptomatic menopausal state: Secondary | ICD-10-CM | POA: Diagnosis not present

## 2020-03-03 DIAGNOSIS — R7303 Prediabetes: Secondary | ICD-10-CM | POA: Diagnosis not present

## 2020-03-03 DIAGNOSIS — R030 Elevated blood-pressure reading, without diagnosis of hypertension: Secondary | ICD-10-CM | POA: Diagnosis not present

## 2020-03-03 DIAGNOSIS — Z23 Encounter for immunization: Secondary | ICD-10-CM

## 2020-03-03 NOTE — Assessment & Plan Note (Signed)
Hyperlipidemia:Low fat diet discussed and encouraged.   Lipid Panel  Lab Results  Component Value Date   CHOL 226 (H) 02/20/2020   HDL 52 02/20/2020   LDLCALC 151 (H) 02/20/2020   TRIG 131 02/20/2020   CHOLHDL 4.3 02/20/2020  deteriorated, needs to reduce fried and fatty foods

## 2020-03-03 NOTE — Assessment & Plan Note (Signed)
Patient educated about the importance of limiting  Carbohydrate intake , the need to commit to daily physical activity for a minimum of 30 minutes , and to commit weight loss. The fact that changes in all these areas will reduce or eliminate all together the development of diabetes is stressed.   Diabetic Labs Latest Ref Rng & Units 02/20/2020 03/14/2019 06/24/2018 06/25/2017 06/11/2017  HbA1c 4.8 - 5.6 % 6.1(H) 5.9(H) 5.8(H) - -  Microalbumin Not Estab. ug/mL - - - - -  Micro/Creat Ratio 0.0 - 30.0 mg/g creat - - - - -  Chol 100 - 199 mg/dL 812(X) 517(G) 017 - -  HDL >39 mg/dL 52 60 57 - -  Calc LDL 0 - 99 mg/dL 494(W) 967(R) 916(B) - -  Triglycerides 0 - 149 mg/dL 846 659 935 - -  Creatinine 0.57 - 1.00 mg/dL 7.01 7.79 3.90 3.00 9.23   BP/Weight 03/03/2020 08/14/2019 07/07/2019 04/22/2019 02/13/2019 06/17/2018 01/01/2018  Systolic BP 157 136 166 143 128 124 117  Diastolic BP 78 78 76 93 78 66 65  Wt. (Lbs) 148.04 160 - 155 153 149.12 -  BMI 28.91 31.25 - 30.27 29.88 29.12 -  Some encounter information is confidential and restricted. Go to Review Flowsheets activity to see all data.   Foot/eye exam completion dates Latest Ref Rng & Units 06/17/2018 08/07/2017  Eye Exam No Retinopathy - No Retinopathy  Foot Form Completion - Done -   Deteriorated , needs to reduce carb intake and re coimmit to regular execise

## 2020-03-03 NOTE — Progress Notes (Signed)
Tracy Humphrey     MRN: [id number]      DOB: 1951-03-30   HPI Tracy Humphrey is here for follow up and re-evaluation of chronic medical conditions, medication management and review of any available recent lab and radiology data.  Preventive health is updated, specifically  Cancer screening and Immunization.   Questions or concerns regarding consultations or procedures which the PT has had in the interim are  addressed. C/o stress and anxiety concerned about her son and the choices he makes learning and trying to withdraw and let him be the adult he is. Has been overeating to compensate her anxiety and her cholesterol and blood sugar have increased, also not able to exercise, caring for her grand daughter  ROS Denies recent fever or chills. Denies uncontrolled sinus pressure, nasal congestion, ear pain or sore throat. Denies chest congestion, productive cough or wheezing. Denies chest pains, palpitations and leg swelling Denies abdominal pain, nausea, vomiting,diarrhea or constipation.   Denies dysuria, frequency, hesitancy or incontinence. Denies joint pain, swelling and limitation in mobility. Denies headaches, seizures, numbness, or tingling.  Denies skin break down or rash.   PE  BP (!) 157/78   Pulse 80   Resp 16   Ht 5' (1.524 m)   Wt 148 lb 0.6 oz (67.2 kg)   SpO2 98%   BMI 28.91 kg/m   Patient alert and oriented and in no cardiopulmonary distress.  HEENT: No facial asymmetry, EOMI,     Neck supple .  Chest: Clear to auscultation bilaterally.  CVS: S1, S2 no murmurs, no S3.Regular rate.  Ext: No edema  MS: Adequate ROM spine, shoulders, hips and knees.  Skin: Intact, no ulcerations or rash noted.  Psych: Good eye contact, normal affect. Memory intact not anxious or depressed appearing.  CNS: CN 2-12 intact, power,  normal throughout.no focal deficits noted.   Assessment & Plan  Prediabetes Patient educated about the importance of limiting  Carbohydrate intake ,  the need to commit to daily physical activity for a minimum of 30 minutes , and to commit weight loss. The fact that changes in all these areas will reduce or eliminate all together the development of diabetes is stressed.   Diabetic Labs Latest Ref Rng & Units 02/20/2020 03/14/2019 06/24/2018 06/25/2017 06/11/2017  HbA1c 4.8 - 5.6 % 6.1(H) 5.9(H) 5.8(H) - -  Microalbumin Not Estab. ug/mL - - - - -  Micro/Creat Ratio 0.0 - 30.0 mg/g creat - - - - -  Chol 100 - 199 mg/dL 818(E) 993(Z) 169 - -  HDL >39 mg/dL 52 60 57 - -  Calc LDL 0 - 99 mg/dL 678(L) 381(O) 175(Z) - -  Triglycerides 0 - 149 mg/dL 025 852 778 - -  Creatinine 0.57 - 1.00 mg/dL 2.42 3.53 6.14 4.31 5.40   BP/Weight 03/03/2020 08/14/2019 07/07/2019 04/22/2019 02/13/2019 06/17/2018 01/01/2018  Systolic BP 157 136 166 143 128 124 117  Diastolic BP 78 78 76 93 78 66 65  Wt. (Lbs) 148.04 160 - 155 153 149.12 -  BMI 28.91 31.25 - 30.27 29.88 29.12 -  Some encounter information is confidential and restricted. Go to Review Flowsheets activity to see all data.   Foot/eye exam completion dates Latest Ref Rng & Units 06/17/2018 08/07/2017  Eye Exam No Retinopathy - No Retinopathy  Foot Form Completion - Done -   Deteriorated , needs to reduce carb intake and re coimmit to regular execise   Dyslipidemia, goal LDL below 100 Hyperlipidemia:Low fat diet  discussed and encouraged.   Lipid Panel  Lab Results  Component Value Date   CHOL 226 (H) 02/20/2020   HDL 52 02/20/2020   LDLCALC 151 (H) 02/20/2020   TRIG 131 02/20/2020   CHOLHDL 4.3 02/20/2020  deteriorated, needs to reduce fried and fatty foods     Overweight (BMI 25.0-29.9) Improved  Patient re-educated about  the importance of commitment to a  minimum of 150 minutes of exercise per week as able.  The importance of healthy food choices with portion control discussed, as well as eating regularly and within a 12 hour window most days. The need to choose "clean , green" food 50 to  75% of the time is discussed, as well as to make water the primary drink and set a goal of 64 ounces water daily.    Weight /BMI 03/03/2020 08/14/2019 04/22/2019  WEIGHT 148 lb 0.6 oz 160 lb 155 lb  HEIGHT 5\' 0"  5\' 0"  5\' 0"   BMI 28.91 kg/m2 31.25 kg/m2 30.27 kg/m2  Some encounter information is confidential and restricted. Go to Review Flowsheets activity to see all data.      Elevated blood pressure reading in office without diagnosis of hypertension DASH diet and commitment to daily physical activity for a minimum of 30 minutes discussed and encouraged, as a part of hypertension management. The importance of attaining a healthy weight is also discussed.  BP/Weight 03/03/2020 08/14/2019 07/07/2019 04/22/2019 02/13/2019 06/17/2018 01/01/2018  Systolic BP 157 136 166 143 128 124 117  Diastolic BP 78 78 76 93 78 66 65  Wt. (Lbs) 148.04 160 - 155 153 149.12 -  BMI 28.91 31.25 - 30.27 29.88 29.12 -  Some encounter information is confidential and restricted. Go to Review Flowsheets activity to see all data.     Re evaluate in 6 monhths

## 2020-03-03 NOTE — Patient Instructions (Addendum)
Annual physical exam  Early Feb , call if yopu need me sooner  Please schedule mammogram due dec, and dexa as soon as possible,  pt requests early am appt   Flu vaccine today  Please get covid booster when due, as you plan  Work on regular exercise, weight loss, low sodium, low fat diet to help to improve blood pressure, blood sugar and weight  Check BP at home weekly, if you get consistently 140/90 or more, call for sooner appointment to manage the blood pressure please. I believe that you DO need medication  Fasting lipid, cmp and egfr, hBA1C  4 to 7 days before next appt     DASH Eating Plan DASH stands for "Dietary Approaches to Stop Hypertension." The DASH eating plan is a healthy eating plan that has been shown to reduce high blood pressure (hypertension). It may also reduce your risk for type 2 diabetes, heart disease, and stroke. The DASH eating plan may also help with weight loss. What are tips for following this plan?  General guidelines  Avoid eating more than 2,300 mg (milligrams) of salt (sodium) a day. If you have hypertension, you may need to reduce your sodium intake to 1,500 mg a day.  Limit alcohol intake to no more than 1 drink a day for nonpregnant women and 2 drinks a day for men. One drink equals 12 oz of beer, 5 oz of wine, or 1 oz of hard liquor.  Work with your health care provider to maintain a healthy body weight or to lose weight. Ask what an ideal weight is for you.  Get at least 30 minutes of exercise that causes your heart to beat faster (aerobic exercise) most days of the week. Activities may include walking, swimming, or biking.  Work with your health care provider or diet and nutrition specialist (dietitian) to adjust your eating plan to your individual calorie needs. Reading food labels   Check food labels for the amount of sodium per serving. Choose foods with less than 5 percent of the Daily Value of sodium. Generally, foods with less than 300  mg of sodium per serving fit into this eating plan.  To find whole grains, look for the word "whole" as the first word in the ingredient list. Shopping  Buy products labeled as "low-sodium" or "no salt added."  Buy fresh foods. Avoid canned foods and premade or frozen meals. Cooking  Avoid adding salt when cooking. Use salt-free seasonings or herbs instead of table salt or sea salt. Check with your health care provider or pharmacist before using salt substitutes.  Do not fry foods. Cook foods using healthy methods such as baking, boiling, grilling, and broiling instead.  Cook with heart-healthy oils, such as olive, canola, soybean, or sunflower oil. Meal planning  Eat a balanced diet that includes: ? 5 or more servings of fruits and vegetables each day. At each meal, try to fill half of your plate with fruits and vegetables. ? Up to 6-8 servings of whole grains each day. ? Less than 6 oz of lean meat, poultry, or fish each day. A 3-oz serving of meat is about the same size as a deck of cards. One egg equals 1 oz. ? 2 servings of low-fat dairy each day. ? A serving of nuts, seeds, or beans 5 times each week. ? Heart-healthy fats. Healthy fats called Omega-3 fatty acids are found in foods such as flaxseeds and coldwater fish, like sardines, salmon, and mackerel.  Limit how much  you eat of the following: ? Canned or prepackaged foods. ? Food that is high in trans fat, such as fried foods. ? Food that is high in saturated fat, such as fatty meat. ? Sweets, desserts, sugary drinks, and other foods with added sugar. ? Full-fat dairy products.  Do not salt foods before eating.  Try to eat at least 2 vegetarian meals each week.  Eat more home-cooked food and less restaurant, buffet, and fast food.  When eating at a restaurant, ask that your food be prepared with less salt or no salt, if possible. What foods are recommended? The items listed may not be a complete list. Talk with your  dietitian about what dietary choices are best for you. Grains Whole-grain or whole-wheat bread. Whole-grain or whole-wheat pasta. Brown rice. [place]. Bulgur. Whole-grain and low-sodium cereals. Pita bread. Low-fat, low-sodium crackers. Whole-wheat flour tortillas. Vegetables Fresh or frozen vegetables (raw, steamed, roasted, or grilled). Low-sodium or reduced-sodium tomato and vegetable juice. Low-sodium or reduced-sodium tomato sauce and tomato paste. Low-sodium or reduced-sodium canned vegetables. Fruits All fresh, dried, or frozen fruit. Canned fruit in natural juice (without added sugar). Meat and other protein foods Skinless chicken or [place]. Ground chicken or [place]. Pork with fat trimmed off. Fish and seafood. Egg whites. Dried beans, peas, or lentils. Unsalted nuts, nut butters, and seeds. Unsalted canned beans. Lean cuts of beef with fat trimmed off. Low-sodium, lean deli meat. Dairy Low-fat (1%) or fat-free (skim) milk. Fat-free, low-fat, or reduced-fat cheeses. Nonfat, low-sodium ricotta or cottage cheese. Low-fat or nonfat yogurt. Low-fat, low-sodium cheese. Fats and oils Soft margarine without trans fats. Vegetable oil. Low-fat, reduced-fat, or light mayonnaise and salad dressings (reduced-sodium). Canola, safflower, olive, soybean, and sunflower oils. Avocado. Seasoning and other foods Herbs. Spices. Seasoning mixes without salt. Unsalted popcorn and pretzels. Fat-free sweets. What foods are not recommended? The items listed may not be a complete list. Talk with your dietitian about what dietary choices are best for you. Grains Baked goods made with fat, such as croissants, muffins, or some breads. Dry pasta or rice meal packs. Vegetables Creamed or fried vegetables. Vegetables in a cheese sauce. Regular canned vegetables (not low-sodium or reduced-sodium). Regular canned tomato sauce and paste (not low-sodium or reduced-sodium). Regular tomato and vegetable juice (not  low-sodium or reduced-sodium). Angie Fava. Olives. Fruits Canned fruit in a light or heavy syrup. Fried fruit. Fruit in cream or butter sauce. Meat and other protein foods Fatty cuts of meat. Ribs. Fried meat. Berniece Salines. Sausage. Bologna and other processed lunch meats. Salami. Fatback. Hotdogs. Bratwurst. Salted nuts and seeds. Canned beans with added salt. Canned or smoked fish. Whole eggs or egg yolks. Chicken or [place] with skin. Dairy Whole or 2% milk, cream, and half-and-half. Whole or full-fat cream cheese. Whole-fat or sweetened yogurt. Full-fat cheese. Nondairy creamers. Whipped toppings. Processed cheese and cheese spreads. Fats and oils Butter. Stick margarine. Lard. Shortening. Ghee. Bacon fat. Tropical oils, such as coconut, palm kernel, or palm oil. Seasoning and other foods Salted popcorn and pretzels. Onion salt, garlic salt, seasoned salt, table salt, and sea salt. Worcestershire sauce. Tartar sauce. Barbecue sauce. Teriyaki sauce. Soy sauce, including reduced-sodium. Steak sauce. Canned and packaged gravies. Fish sauce. Oyster sauce. Cocktail sauce. Horseradish that you find on the shelf. Ketchup. Mustard. Meat flavorings and tenderizers. Bouillon cubes. Hot sauce and Tabasco sauce. Premade or packaged marinades. Premade or packaged taco seasonings. Relishes. Regular salad dressings. Where to find more information:  National Heart, Lung, and Blood Institute: https://wilson-eaton.com/  American Heart Association: www.heart.org Summary  The DASH eating plan is a healthy eating plan that has been shown to reduce high blood pressure (hypertension). It may also reduce your risk for type 2 diabetes, heart disease, and stroke.  With the DASH eating plan, you should limit salt (sodium) intake to 2,300 mg a day. If you have hypertension, you may need to reduce your sodium intake to 1,500 mg a day.  When on the DASH eating plan, aim to eat more fresh fruits and vegetables, whole grains, lean proteins,  low-fat dairy, and heart-healthy fats.  Work with your health care provider or diet and nutrition specialist (dietitian) to adjust your eating plan to your individual calorie needs. This information is not intended to replace advice given to you by your health care provider. Make sure you discuss any questions you have with your health care provider. Document Revised: 06/08/2017 Document Reviewed: 06/19/2016 Elsevier Patient Education  2020 [employer].  Hypertension, Adult Hypertension is another name for high blood pressure. High blood pressure forces your heart to work harder to pump blood. This can cause problems over time. There are two numbers in a blood pressure reading. There is a top number (systolic) over a bottom number (diastolic). It is best to have a blood pressure that is below 120/80. Healthy choices can help lower your blood pressure, or you may need medicine to help lower it. What are the causes? The cause of this condition is not known. Some conditions may be related to high blood pressure. What increases the risk?  Smoking.  Having type 2 diabetes mellitus, high cholesterol, or both.  Not getting enough exercise or physical activity.  Being overweight.  Having too much fat, sugar, calories, or salt (sodium) in your diet.  Drinking too much alcohol.  Having long-term (chronic) kidney disease.  Having a family history of high blood pressure.  Age. Risk increases with age.  Race. You may be at higher risk if you are African American.  Gender. Men are at higher risk than women before age 19. After age 74, women are at higher risk than men.  Having obstructive sleep apnea.  Stress. What are the signs or symptoms?  High blood pressure may not cause symptoms. Very high blood pressure (hypertensive crisis) may cause: ? Headache. ? Feelings of worry or nervousness (anxiety). ? Shortness of breath. ? Nosebleed. ? A feeling of being sick to your stomach  (nausea). ? Throwing up (vomiting). ? Changes in how you see. ? Very bad chest pain. ? Seizures. How is this treated?  This condition is treated by making healthy lifestyle changes, such as: ? Eating healthy foods. ? Exercising more. ? Drinking less alcohol.  Your health care provider may prescribe medicine if lifestyle changes are not enough to get your blood pressure under control, and if: ? Your top number is above 130. ? Your bottom number is above 80.  Your personal target blood pressure may vary. Follow these instructions at home: Eating and drinking   If told, follow the DASH eating plan. To follow this plan: ? Fill one half of your plate at each meal with fruits and vegetables. ? Fill one fourth of your plate at each meal with whole grains. Whole grains include whole-wheat pasta, brown rice, and whole-grain bread. ? Eat or drink low-fat dairy products, such as skim milk or low-fat yogurt. ? Fill one fourth of your plate at each meal with low-fat (lean) proteins. Low-fat proteins include fish, chicken without skin,  eggs, beans, and tofu. ? Avoid fatty meat, cured and processed meat, or chicken with skin. ? Avoid pre-made or processed food.  Eat less than 1,500 mg of salt each day.  Do not drink alcohol if: ? Your doctor tells you not to drink. ? You are pregnant, may be pregnant, or are planning to become pregnant.  If you drink alcohol: ? Limit how much you use to:  0-1 drink a day for women.  0-2 drinks a day for men. ? Be aware of how much alcohol is in your drink. In the [place] one drink equals one 12 oz bottle of beer (355 mL), one 5 oz glass of wine (148 mL), or one 1 oz glass of hard liquor (44 mL). Lifestyle   Work with your doctor to stay at a healthy weight or to lose weight. Ask your doctor what the best weight is for you.  Get at least 30 minutes of exercise most days of the week. This may include walking, swimming, or biking.  Get at least 30  minutes of exercise that strengthens your muscles (resistance exercise) at least 3 days a week. This may include lifting weights or doing Pilates.  Do not use any products that contain nicotine or tobacco, such as cigarettes, e-cigarettes, and chewing tobacco. If you need help quitting, ask your doctor.  Check your blood pressure at home as told by your doctor.  Keep all follow-up visits as told by your doctor. This is important. Medicines  Take over-the-counter and prescription medicines only as told by your doctor. Follow directions carefully.  Do not skip doses of blood pressure medicine. The medicine does not work as well if you skip doses. Skipping doses also puts you at risk for problems.  Ask your doctor about side effects or reactions to medicines that you should watch for. Contact a doctor if you:  Think you are having a reaction to the medicine you are taking.  Have headaches that keep coming back (recurring).  Feel dizzy.  Have swelling in your ankles.  Have trouble with your vision. Get help right away if you:  Get a very bad headache.  Start to feel mixed up (confused).  Feel weak or numb.  Feel faint.  Have very bad pain in your: ? Chest. ? Belly (abdomen).  Throw up more than once.  Have trouble breathing. Summary  Hypertension is another name for high blood pressure.  High blood pressure forces your heart to work harder to pump blood.  For most people, a normal blood pressure is less than 120/80.  Making healthy choices can help lower blood pressure. If your blood pressure does not get lower with healthy choices, you may need to take medicine. This information is not intended to replace advice given to you by your health care provider. Make sure you discuss any questions you have with your health care provider. Document Revised: 03/06/2018 Document Reviewed: 03/06/2018 Elsevier Patient Education  2020 [employer].

## 2020-03-03 NOTE — Assessment & Plan Note (Signed)
DASH diet and commitment to daily physical activity for a minimum of 30 minutes discussed and encouraged, as a part of hypertension management. The importance of attaining a healthy weight is also discussed.  BP/Weight 03/03/2020 08/14/2019 07/07/2019 04/22/2019 02/13/2019 06/17/2018 01/01/2018  Systolic BP 157 136 166 143 128 124 117  Diastolic BP 78 78 76 93 78 66 65  Wt. (Lbs) 148.04 160 - 155 153 149.12 -  BMI 28.91 31.25 - 30.27 29.88 29.12 -  Some encounter information is confidential and restricted. Go to Review Flowsheets activity to see all data.     Re evaluate in 6 monhths

## 2020-03-03 NOTE — Assessment & Plan Note (Signed)
Improved  Patient re-educated about  the importance of commitment to a  minimum of 150 minutes of exercise per week as able.  The importance of healthy food choices with portion control discussed, as well as eating regularly and within a 12 hour window most days. The need to choose "clean , green" food 50 to 75% of the time is discussed, as well as to make water the primary drink and set a goal of 64 ounces water daily.    Weight /BMI 03/03/2020 08/14/2019 04/22/2019  WEIGHT 148 lb 0.6 oz 160 lb 155 lb  HEIGHT 5\' 0"  5\' 0"  5\' 0"   BMI 28.91 kg/m2 31.25 kg/m2 30.27 kg/m2  Some encounter information is confidential and restricted. Go to Review Flowsheets activity to see all data.

## 2020-03-29 ENCOUNTER — Ambulatory Visit (HOSPITAL_COMMUNITY)
Admission: RE | Admit: 2020-03-29 | Discharge: 2020-03-29 | Disposition: A | Discharge status: Home / Self Care | Payer: PPO | Source: Ambulatory Visit | Attending: Family Medicine | Admitting: Family Medicine

## 2020-03-29 ENCOUNTER — Encounter: Payer: Self-pay | Admitting: Family Medicine

## 2020-03-29 ENCOUNTER — Other Ambulatory Visit: Payer: Self-pay

## 2020-03-29 DIAGNOSIS — Z780 Asymptomatic menopausal state: Secondary | ICD-10-CM | POA: Insufficient documentation

## 2020-03-29 DIAGNOSIS — M85851 Other specified disorders of bone density and structure, right thigh: Secondary | ICD-10-CM | POA: Diagnosis not present

## 2020-04-09 ENCOUNTER — Encounter: Payer: Self-pay | Admitting: Family Medicine

## 2020-04-12 ENCOUNTER — Encounter: Payer: Self-pay | Admitting: Family Medicine

## 2020-06-05 ENCOUNTER — Encounter: Payer: Self-pay | Admitting: Family Medicine

## 2020-06-30 ENCOUNTER — Other Ambulatory Visit: Payer: Self-pay

## 2020-06-30 ENCOUNTER — Ambulatory Visit (HOSPITAL_COMMUNITY)
Admission: RE | Admit: 2020-06-30 | Discharge: 2020-06-30 | Disposition: A | Discharge status: Home / Self Care | Payer: PPO | Source: Ambulatory Visit | Attending: Family Medicine | Admitting: Family Medicine

## 2020-06-30 DIAGNOSIS — Z1231 Encounter for screening mammogram for malignant neoplasm of breast: Secondary | ICD-10-CM | POA: Diagnosis not present

## 2020-08-10 DIAGNOSIS — E119 Type 2 diabetes mellitus without complications: Secondary | ICD-10-CM | POA: Diagnosis not present

## 2020-08-10 DIAGNOSIS — E785 Hyperlipidemia, unspecified: Secondary | ICD-10-CM | POA: Diagnosis not present

## 2020-08-11 LAB — LIPID PANEL
Chol/HDL Ratio: 3.4 ratio (ref 0.0–4.4)
Cholesterol, Total: 191 mg/dL (ref 100–199)
HDL: 57 mg/dL (ref >39)
LDL Chol Calc (NIH): 114 mg/dL — ABNORMAL HIGH (ref 0–99)
Triglycerides: 113 mg/dL (ref 0–149)
VLDL Cholesterol Cal: 20 mg/dL (ref 5–40)

## 2020-08-11 LAB — CMP14+EGFR
ALT: 16 IU/L (ref 0–32)
AST: 21 IU/L (ref 0–40)
Albumin/Globulin Ratio: 1.8 (ref 1.2–2.2)
Albumin: 4.5 g/dL (ref 3.7–4.7)
Alkaline Phosphatase: 105 IU/L (ref 44–121)
BUN/Creatinine Ratio: 25 (ref 12–28)
BUN: 17 mg/dL (ref 8–27)
Bilirubin Total: 0.6 mg/dL (ref 0.0–1.2)
CO2: 25 mmol/L (ref 20–29)
Calcium: 9.7 mg/dL (ref 8.7–10.3)
Chloride: 102 mmol/L (ref 96–106)
Creatinine, Ser: 0.68 mg/dL (ref 0.57–1.00)
GFR calc Af Amer: 102 mL/min/{1.73_m2} (ref >59)
GFR calc non Af Amer: 88 mL/min/{1.73_m2} (ref >59)
Globulin, Total: 2.5 g/dL (ref 1.5–4.5)
Glucose: 101 mg/dL — ABNORMAL HIGH (ref 65–99)
Potassium: 4 mmol/L (ref 3.5–5.2)
Sodium: 142 mmol/L (ref 134–144)
Total Protein: 7 g/dL (ref 6.0–8.5)

## 2020-08-11 LAB — HEMOGLOBIN A1C
Est. average glucose Bld gHb Est-mCnc: 117 mg/dL
Hgb A1c MFr Bld: 5.7 % — ABNORMAL HIGH (ref 4.8–5.6)

## 2020-08-18 ENCOUNTER — Encounter: Payer: PPO | Admitting: Family Medicine

## 2020-09-26 ENCOUNTER — Encounter: Payer: Self-pay | Admitting: Family Medicine

## 2020-10-04 ENCOUNTER — Encounter: Payer: Self-pay | Admitting: Family Medicine

## 2020-11-01 DIAGNOSIS — H43813 Vitreous degeneration, bilateral: Secondary | ICD-10-CM | POA: Diagnosis not present

## 2020-11-15 ENCOUNTER — Encounter: Payer: Self-pay | Admitting: Family Medicine

## 2021-01-11 ENCOUNTER — Other Ambulatory Visit: Payer: Self-pay

## 2021-01-11 ENCOUNTER — Other Ambulatory Visit (HOSPITAL_COMMUNITY): Payer: Self-pay | Admitting: Family Medicine

## 2021-01-11 ENCOUNTER — Ambulatory Visit (INDEPENDENT_AMBULATORY_CARE_PROVIDER_SITE_OTHER): Payer: PPO | Admitting: Family Medicine

## 2021-01-11 ENCOUNTER — Encounter: Payer: Self-pay | Admitting: Family Medicine

## 2021-01-11 VITALS — BP 124/78 | HR 86 | Temp 98.7°F | Resp 20 | Ht 60.0 in | Wt 141.0 lb

## 2021-01-11 DIAGNOSIS — E559 Vitamin D deficiency, unspecified: Secondary | ICD-10-CM

## 2021-01-11 DIAGNOSIS — R030 Elevated blood-pressure reading, without diagnosis of hypertension: Secondary | ICD-10-CM

## 2021-01-11 DIAGNOSIS — Z0000 Encounter for general adult medical examination without abnormal findings: Secondary | ICD-10-CM

## 2021-01-11 DIAGNOSIS — E8881 Metabolic syndrome: Secondary | ICD-10-CM

## 2021-01-11 DIAGNOSIS — E785 Hyperlipidemia, unspecified: Secondary | ICD-10-CM

## 2021-01-11 DIAGNOSIS — Z1231 Encounter for screening mammogram for malignant neoplasm of breast: Secondary | ICD-10-CM

## 2021-01-11 DIAGNOSIS — R7303 Prediabetes: Secondary | ICD-10-CM | POA: Diagnosis not present

## 2021-01-11 NOTE — Patient Instructions (Addendum)
F./U in 6 months in office, call if you need me sooner  Please come for flu vaccine in September, send message for appointment  Please get fasting CBC, lipid, chem 7 and EGFr, HBa1C, TSH and vit D end August  Please get your Shingrix vaccines at your pharmacy, they are overdue  Please stop aleve and see if you can get off of daily tylenol if you have no pain you do not need these    Please schedule mammogram for Dec 23 or after at checkout  Keep up great health habits  Thanks for choosing [facility], we consider it a privelige to serve you.]

## 2021-01-11 NOTE — Assessment & Plan Note (Signed)

## 2021-01-11 NOTE — Progress Notes (Signed)
    Tracy Humphrey     MRN: [id number]      DOB: 1950-11-29  HPI: Patient is in for annual physical exam. No other health concerns are expressed or addressed at the visit.  labs,  need updating    PE: BP 124/78   Pulse 86   Temp 98.7 F (37.1 C)   Resp 20   Ht 5' (1.524 m)   Wt 141 lb (64 kg)   SpO2 95%   BMI 27.54 kg/m   Pleasant  female, alert and oriented x 3, in no cardio-pulmonary distress. Afebrile. HEENT No facial trauma or asymetry. Sinuses non tender.  Extra occullar muscles intact.. External ears normal, . Neck: supple, no adenopathy,JVD or thyromegaly.No bruits.  Chest: Clear to ascultation bilaterally.No crackles or wheezes. Non tender to palpation    Cardiovascular system; Heart sounds normal,  S1 and  S2 ,no S3.  No murmur, or thrill. Apical beat not displaced Peripheral pulses normal.  Abdomen: Soft, non tender, no organomegaly or masses. No bruits. Bowel sounds normal. No guarding, tenderness or rebound.    Musculoskeletal exam: Full ROM of spine, hips , shoulders and knees. No deformity ,swelling or crepitus noted. No muscle wasting or atrophy.   Neurologic: Cranial nerves 2 to 12 intact. Power, tone ,sensation and reflexes normal throughout. No disturbance in gait. No tremor.  Skin: Intact, no ulceration, erythema , scaling or rash noted. Pigmentation normal throughout  Psych; Normal mood and affect. Judgement and concentration normal   Assessment & Plan:  Encounter for annual physical exam Annual exam as documented. Counseling done  re healthy lifestyle involving commitment to 150 minutes exercise per week, heart healthy diet, and attaining healthy weight.The importance of adequate sleep also discussed. Regular seat belt use and home safety, is also discussed. Changes in health habits are decided on by the patient with goals and time frames  set for achieving them. Immunization and cancer screening needs are specifically  addressed at this visit.

## 2021-03-09 DIAGNOSIS — E559 Vitamin D deficiency, unspecified: Secondary | ICD-10-CM | POA: Diagnosis not present

## 2021-03-09 DIAGNOSIS — E8881 Metabolic syndrome: Secondary | ICD-10-CM | POA: Diagnosis not present

## 2021-03-09 DIAGNOSIS — R7303 Prediabetes: Secondary | ICD-10-CM | POA: Diagnosis not present

## 2021-03-09 DIAGNOSIS — M353 Polymyalgia rheumatica: Secondary | ICD-10-CM | POA: Diagnosis not present

## 2021-03-09 DIAGNOSIS — E119 Type 2 diabetes mellitus without complications: Secondary | ICD-10-CM | POA: Diagnosis not present

## 2021-03-09 DIAGNOSIS — E785 Hyperlipidemia, unspecified: Secondary | ICD-10-CM | POA: Diagnosis not present

## 2021-03-09 DIAGNOSIS — R030 Elevated blood-pressure reading, without diagnosis of hypertension: Secondary | ICD-10-CM | POA: Diagnosis not present

## 2021-03-10 ENCOUNTER — Encounter: Payer: Self-pay | Admitting: Family Medicine

## 2021-03-10 ENCOUNTER — Other Ambulatory Visit: Payer: Self-pay

## 2021-03-10 DIAGNOSIS — R7303 Prediabetes: Secondary | ICD-10-CM

## 2021-03-10 DIAGNOSIS — E785 Hyperlipidemia, unspecified: Secondary | ICD-10-CM

## 2021-03-10 LAB — CBC WITH DIFFERENTIAL/PLATELET
Basophils Absolute: 0 10*3/uL (ref 0.0–0.2)
Basos: 0 %
EOS (ABSOLUTE): 0.1 10*3/uL (ref 0.0–0.4)
Eos: 1 %
Hematocrit: 39.9 % (ref 34.0–46.6)
Hemoglobin: 13.7 g/dL (ref 11.1–15.9)
Immature Grans (Abs): 0 10*3/uL (ref 0.0–0.1)
Immature Granulocytes: 0 %
Lymphocytes Absolute: 2.3 10*3/uL (ref 0.7–3.1)
Lymphs: 31 %
MCH: 30.3 pg (ref 26.6–33.0)
MCHC: 34.3 g/dL (ref 31.5–35.7)
MCV: 88 fL (ref 79–97)
Monocytes Absolute: 0.6 10*3/uL (ref 0.1–0.9)
Monocytes: 7 %
Neutrophils Absolute: 4.6 10*3/uL (ref 1.4–7.0)
Neutrophils: 61 %
Platelets: 157 10*3/uL (ref 150–450)
RBC: 4.52 x10E6/uL (ref 3.77–5.28)
RDW: 11.9 % (ref 11.7–15.4)
WBC: 7.7 10*3/uL (ref 3.4–10.8)

## 2021-03-10 LAB — VITAMIN D 25 HYDROXY (VIT D DEFICIENCY, FRACTURES): Vit D, 25-Hydroxy: 43 ng/mL (ref 30.0–100.0)

## 2021-03-10 LAB — LIPID PANEL
Chol/HDL Ratio: 3.8 ratio (ref 0.0–4.4)
Cholesterol, Total: 209 mg/dL — ABNORMAL HIGH (ref 100–199)
HDL: 55 mg/dL (ref >39)
LDL Chol Calc (NIH): 130 mg/dL — ABNORMAL HIGH (ref 0–99)
Triglycerides: 134 mg/dL (ref 0–149)
VLDL Cholesterol Cal: 24 mg/dL (ref 5–40)

## 2021-03-10 LAB — CMP14+EGFR
ALT: 16 IU/L (ref 0–32)
AST: 18 IU/L (ref 0–40)
Albumin/Globulin Ratio: 2.1 (ref 1.2–2.2)
Albumin: 4.8 g/dL — ABNORMAL HIGH (ref 3.7–4.7)
Alkaline Phosphatase: 97 IU/L (ref 44–121)
BUN/Creatinine Ratio: 27 (ref 12–28)
BUN: 19 mg/dL (ref 8–27)
Bilirubin Total: 0.7 mg/dL (ref 0.0–1.2)
CO2: 25 mmol/L (ref 20–29)
Calcium: 9.8 mg/dL (ref 8.7–10.3)
Chloride: 101 mmol/L (ref 96–106)
Creatinine, Ser: 0.7 mg/dL (ref 0.57–1.00)
Globulin, Total: 2.3 g/dL (ref 1.5–4.5)
Glucose: 94 mg/dL (ref 65–99)
Potassium: 4.5 mmol/L (ref 3.5–5.2)
Sodium: 141 mmol/L (ref 134–144)
Total Protein: 7.1 g/dL (ref 6.0–8.5)
eGFR: 92 mL/min/{1.73_m2} (ref >59)

## 2021-03-10 LAB — HEMOGLOBIN A1C
Est. average glucose Bld gHb Est-mCnc: 120 mg/dL
Hgb A1c MFr Bld: 5.8 % — ABNORMAL HIGH (ref 4.8–5.6)

## 2021-03-10 LAB — TSH: TSH: 2.13 u[IU]/mL (ref 0.450–4.500)

## 2021-03-10 NOTE — Progress Notes (Signed)
Sent result via MyChart, lab orders added.

## 2021-03-29 ENCOUNTER — Encounter: Payer: Self-pay | Admitting: Family Medicine

## 2021-04-18 ENCOUNTER — Encounter: Payer: Self-pay | Admitting: Family Medicine

## 2021-07-07 ENCOUNTER — Other Ambulatory Visit: Payer: Self-pay

## 2021-07-07 ENCOUNTER — Ambulatory Visit (HOSPITAL_COMMUNITY)
Admission: RE | Admit: 2021-07-07 | Discharge: 2021-07-07 | Disposition: A | Discharge status: Home / Self Care | Payer: PPO | Source: Ambulatory Visit | Attending: Family Medicine | Admitting: Family Medicine

## 2021-07-07 DIAGNOSIS — Z1231 Encounter for screening mammogram for malignant neoplasm of breast: Secondary | ICD-10-CM | POA: Insufficient documentation

## 2021-07-12 DIAGNOSIS — R7303 Prediabetes: Secondary | ICD-10-CM | POA: Diagnosis not present

## 2021-07-12 DIAGNOSIS — E785 Hyperlipidemia, unspecified: Secondary | ICD-10-CM | POA: Diagnosis not present

## 2021-07-13 LAB — LIPID PANEL
Chol/HDL Ratio: 3.5 ratio (ref 0.0–4.4)
Cholesterol, Total: 247 mg/dL — ABNORMAL HIGH (ref 100–199)
HDL: 71 mg/dL (ref >39)
LDL Chol Calc (NIH): 154 mg/dL — ABNORMAL HIGH (ref 0–99)
Triglycerides: 127 mg/dL (ref 0–149)
VLDL Cholesterol Cal: 22 mg/dL (ref 5–40)

## 2021-07-13 LAB — HEMOGLOBIN A1C
Est. average glucose Bld gHb Est-mCnc: 120 mg/dL
Hgb A1c MFr Bld: 5.8 % — ABNORMAL HIGH (ref 4.8–5.6)

## 2021-07-14 ENCOUNTER — Ambulatory Visit: Payer: PPO | Admitting: Family Medicine

## 2021-08-18 ENCOUNTER — Ambulatory Visit: Payer: PPO | Admitting: Family Medicine

## 2021-08-26 ENCOUNTER — Ambulatory Visit (INDEPENDENT_AMBULATORY_CARE_PROVIDER_SITE_OTHER): Payer: PPO | Admitting: Family Medicine

## 2021-08-26 ENCOUNTER — Other Ambulatory Visit: Payer: Self-pay

## 2021-08-26 DIAGNOSIS — K921 Melena: Secondary | ICD-10-CM

## 2021-08-26 DIAGNOSIS — K529 Noninfective gastroenteritis and colitis, unspecified: Secondary | ICD-10-CM | POA: Diagnosis not present

## 2021-08-26 MED ORDER — DIPHENOXYLATE-ATROPINE 2.5-0.025 MG PO TABS: 1.0000 | ORAL_TABLET | Freq: Four times a day (QID) | ORAL | 0 refills | Status: DC | PRN

## 2021-08-26 NOTE — Progress Notes (Signed)
Virtual Visit via Telephone Note  I connected with Tracy Humphrey on 08/26/21 at  2:20 PM EST by telephone and verified that I am speaking with the correct person using two identifiers.  Location: Patient: home Provider: office   I discussed the limitations, risks, security and privacy concerns of performing an evaluation and management service by telephone and the availability of in person appointments. I also discussed with the patient that there may be a patient responsible charge related to this service. The patient expressed understanding and agreed to proceed.   History of Present Illness: Loose watery stool which started 2 days ago , worse day 5  BM, today 2, still loose , no visible blood but after taking peptobismol stool has been black and se is concerned as she is aware that this may represent blood . Denies fever, chills, body aches or malaise, does report poor appetite since symptom onset , no one else she knows is affected Review of her colonoscopy reports diverticulosis, which she strafes she was unaware of, however I assured her that bleeding diverticula shows as bright red blood and I do believe that the black color is due to Pepto   Observations/Objective: There were no vitals taken for this visit. Good communication with no confusion and intact memory. Alert and oriented x 3 No signs of respiratory distress during speech    Assessment and Plan:  Acute gastroenteritis acute onset loose stool and poor appetite. Educated re need t ostay hydrated, and that the natural course I would expect symptom relief in a 3 to 5 day period. Advised if symptoms worsen she should go to Urgent care   Black stool Acute onset associated with Pepto Bismol for loose stool. Unlikely to represent bleed, lomotil prescribed to replace pepto Advised if becomes light headed or black stool persists or worsens , go to urgent care or ED Will hold on CBC, unless symptom persists or worsens  Follow Up  Instructions:    I discussed the assessment and treatment plan with the patient. The patient was provided an opportunity to ask questions and all were answered. The patient agreed with the plan and demonstrated an understanding of the instructions.   The patient was advised to call back or seek an in-person evaluation if the symptoms worsen or if the condition fails to improve as anticipated.  I provided  9 minutes of non-face-to-face time during this encounter.   Syliva Overman, MD

## 2021-08-26 NOTE — Patient Instructions (Addendum)
F/U as before, call if you need me sooner  You are treated fo acute gastroenteritis, you have reduced appetite and loose stool, should clear in 4 to 5 days  Stool is black most likely because of peptobismol  I prescribed limited lomoyil to use instead if needed for ongoing increased frequency and watery stool  If you develop abdominal pain, fever, chills light headedness please go to the urgent care or ED for evaluation  Thanks for choosing [facility], we consider it a privelige to serve you.

## 2021-08-28 ENCOUNTER — Encounter: Payer: Self-pay | Admitting: Family Medicine

## 2021-08-28 DIAGNOSIS — K921 Melena: Secondary | ICD-10-CM | POA: Insufficient documentation

## 2021-08-28 NOTE — Assessment & Plan Note (Signed)
acute onset loose stool and poor appetite. Educated re need t ostay hydrated, and that the natural course I would expect symptom relief in a 3 to 5 day period. Advised if symptoms worsen she should go to Urgent care

## 2021-08-28 NOTE — Assessment & Plan Note (Signed)
Acute onset associated with Pepto Bismol for loose stool. Unlikely to represent bleed, lomotil prescribed to replace pepto Advised if becomes light headed or black stool persists or worsens , go to urgent care or ED Will hold on CBC, unless symptom persists or worsens

## 2021-09-21 ENCOUNTER — Encounter: Payer: Self-pay | Admitting: Family Medicine

## 2021-09-21 ENCOUNTER — Other Ambulatory Visit: Payer: Self-pay

## 2021-09-21 ENCOUNTER — Ambulatory Visit (INDEPENDENT_AMBULATORY_CARE_PROVIDER_SITE_OTHER): Payer: PPO | Admitting: Family Medicine

## 2021-09-21 VITALS — BP 149/72 | HR 84 | Resp 16 | Ht 60.0 in | Wt 136.4 lb

## 2021-09-21 DIAGNOSIS — Z1211 Encounter for screening for malignant neoplasm of colon: Secondary | ICD-10-CM

## 2021-09-21 DIAGNOSIS — E559 Vitamin D deficiency, unspecified: Secondary | ICD-10-CM | POA: Diagnosis not present

## 2021-09-21 DIAGNOSIS — R7303 Prediabetes: Secondary | ICD-10-CM

## 2021-09-21 DIAGNOSIS — J302 Other seasonal allergic rhinitis: Secondary | ICD-10-CM

## 2021-09-21 DIAGNOSIS — E785 Hyperlipidemia, unspecified: Secondary | ICD-10-CM | POA: Diagnosis not present

## 2021-09-21 DIAGNOSIS — E8881 Metabolic syndrome: Secondary | ICD-10-CM | POA: Diagnosis not present

## 2021-09-21 DIAGNOSIS — R030 Elevated blood-pressure reading, without diagnosis of hypertension: Secondary | ICD-10-CM | POA: Diagnosis not present

## 2021-09-21 DIAGNOSIS — E119 Type 2 diabetes mellitus without complications: Secondary | ICD-10-CM

## 2021-09-21 NOTE — Patient Instructions (Addendum)
Annual exam end August or early [place], call if you need me sooner ? ?Increase carb intake and reduce nuts and animal protein , egg white and beans are good sources of protein ? ?Call back / message re finger pain when you decide to  ? ?Please arrange cologuard at checkout ? ?Fasting CBC, lipid, cmp and eGFR, tSH, hBA1c, vit D 1 week before next appointment ? ?It is important that you exercise regularly at least 30 minutes 5 times a week. If you develop chest pain, have severe difficulty breathing, or feel very tired, stop exercising immediately and seek medical attention  ? ?Thanks for choosing [facility], we consider it a privelige to serve you. ? ? ? ?

## 2021-09-26 ENCOUNTER — Encounter: Payer: Self-pay | Admitting: Family Medicine

## 2021-09-26 NOTE — Assessment & Plan Note (Signed)
Controlled, no change in medication  

## 2021-09-26 NOTE — Progress Notes (Signed)
? ?ACASIA SKILTON     MRN: [id number]      DOB: May 13, 1951 ? ? ?HPI ?Ms. Fielder is here for follow up and re-evaluation of chronic medical conditions, medication management and review of any available recent lab and radiology data.  ?Preventive health is updated, specifically  Cancer screening and Immunization.   ?Questions or concerns regarding consultations or procedures which the PT has had in the interim are  addressed. ?The PT denies any adverse reactions to current medications since the last visit.  ?There are no new concerns.  ?There are no specific complaints  ? ?ROS ?Denies recent fever or chills. ?Denies sinus pressure, nasal congestion, ear pain or sore throat. ?Denies chest congestion, productive cough or wheezing. ?Denies chest pains, palpitations and leg swelling ?Denies abdominal pain, nausea, vomiting,diarrhea or constipation.   ?Denies dysuria, frequency, hesitancy or incontinence. ?Denies joint pain, swelling and limitation in mobility. ?Denies headaches, seizures, numbness, or tingling. ?Denies depression, anxiety or insomnia. ?Denies skin break down or rash. ? ? ?PE ? ?BP (!) 149/72   Pulse 84   Resp 16   Ht 5' (1.524 m)   Wt 136 lb 6.4 oz (61.9 kg)   SpO2 98%   BMI 26.64 kg/m?  ? ?Patient alert and oriented and in no cardiopulmonary distress. ? ?HEENT: No facial asymmetry, EOMI,     Neck supple . ? ?Chest: Clear to auscultation bilaterally. ? ?CVS: S1, S2 no murmurs, no S3.Regular rate. ? ?ABD: Soft non tender.  ? ?Ext: No edema ? ?MS: Adequate ROM spine, shoulders, hips and knees. ? ?Skin: Intact, no ulcerations or rash noted. ? ?Psych: Good eye contact, normal affect. Memory intact not anxious or depressed appearing. ? ?CNS: CN 2-12 intact, power,  normal throughout.no focal deficits noted. ? ? ?Assessment & Plan ? ?Prediabetes ?Patient educated about the importance of limiting  Carbohydrate intake , the need to commit to daily physical activity for a minimum of 30 minutes , and to commit  weight loss. ?The fact that changes in all these areas will reduce or eliminate all together the development of diabetes is stressed.  ?unchanged ? ?Diabetic Labs Latest Ref Rng & Units 07/12/2021 03/09/2021 08/10/2020 02/20/2020 03/14/2019  ?HbA1c 4.8 - 5.6 % 5.8(H) 5.8(H) 5.7(H) 6.1(H) 5.9(H)  ?Microalbumin Not Estab. ug/mL - - - - -  ?Micro/Creat Ratio 0.0 - 30.0 mg/g creat - - - - -  ?Chol 100 - 199 mg/dL 163(W) 466(Z) 993 570(V) 211(H)  ?HDL >39 mg/dL 71 55 57 52 60  ?Calc LDL 0 - 99 mg/dL 779(T) 903(E) 092(Z) 300(T) 129(H)  ?Triglycerides 0 - 149 mg/dL 622 633 354 562 563  ?Creatinine 0.57 - 1.00 mg/dL - 8.93 7.34 2.87 6.81  ? ?BP/Weight 09/21/2021 01/11/2021 03/03/2020 08/14/2019 07/07/2019 04/22/2019 02/13/2019  ?Systolic BP 149 124 157 136 166 143 128  ?Diastolic BP 72 78 78 78 76 93 78  ?Wt. (Lbs) 136.4 141 148.04 160 - 155 153  ?BMI 26.64 27.54 28.91 31.25 - 30.27 29.88  ?Some encounter information is confidential and restricted. Go to Review Flowsheets activity to see all data.  ? ?Foot/eye exam completion dates Latest Ref Rng & Units 06/17/2018 08/07/2017  ?Eye Exam No Retinopathy - No Retinopathy  ?Foot Form Completion - Done -  ? ? ? ? ?Elevated blood pressure reading in office without diagnosis of hypertension ?DASH diet and commitment to daily physical activity for a minimum of 30 minutes discussed and encouraged, as a part of hypertension management. ?The importance  of attaining a healthy weight is also discussed. ? ?BP/Weight 09/21/2021 01/11/2021 03/03/2020 08/14/2019 07/07/2019 04/22/2019 02/13/2019  ?Systolic BP 149 124 157 136 166 143 128  ?Diastolic BP 72 78 78 78 76 93 78  ?Wt. (Lbs) 136.4 141 148.04 160 - 155 153  ?BMI 26.64 27.54 28.91 31.25 - 30.27 29.88  ?Some encounter information is confidential and restricted. Go to Review Flowsheets activity to see all data.  ? ? ? ? ? ?Dyslipidemia, goal LDL below 100 ?Hyperlipidemia:Low fat diet discussed and encouraged.elevated, not at goal ? ? ?Lipid Panel  ?Lab Results   ?Component Value Date  ? CHOL 247 (H) 07/12/2021  ? HDL 71 07/12/2021  ? LDLCALC 154 (H) 07/12/2021  ? TRIG 127 07/12/2021  ? CHOLHDL 3.5 07/12/2021  ? ?elevated ? ? ? ?Seasonal allergies ?Controlled, no change in medication ? ? ?

## 2021-09-26 NOTE — Assessment & Plan Note (Signed)
Patient educated about the importance of limiting  Carbohydrate intake , the need to commit to daily physical activity for a minimum of 30 minutes , and to commit weight loss. ?The fact that changes in all these areas will reduce or eliminate all together the development of diabetes is stressed.  ?unchanged ? ?Diabetic Labs Latest Ref Rng & Units 07/12/2021 03/09/2021 08/10/2020 02/20/2020 03/14/2019  ?HbA1c 4.8 - 5.6 % 5.8(H) 5.8(H) 5.7(H) 6.1(H) 5.9(H)  ?Microalbumin Not Estab. ug/mL - - - - -  ?Micro/Creat Ratio 0.0 - 30.0 mg/g creat - - - - -  ?Chol 100 - 199 mg/dL 277(O) 242(P) 536 144(R) 211(H)  ?HDL >39 mg/dL 71 55 57 52 60  ?Calc LDL 0 - 99 mg/dL 154(M) 086(P) 619(J) 093(O) 129(H)  ?Triglycerides 0 - 149 mg/dL 671 245 809 983 382  ?Creatinine 0.57 - 1.00 mg/dL - 5.05 3.97 6.73 4.19  ? ?BP/Weight 09/21/2021 01/11/2021 03/03/2020 08/14/2019 07/07/2019 04/22/2019 02/13/2019  ?Systolic BP 149 124 157 136 166 143 128  ?Diastolic BP 72 78 78 78 76 93 78  ?Wt. (Lbs) 136.4 141 148.04 160 - 155 153  ?BMI 26.64 27.54 28.91 31.25 - 30.27 29.88  ?Some encounter information is confidential and restricted. Go to Review Flowsheets activity to see all data.  ? ?Foot/eye exam completion dates Latest Ref Rng & Units 06/17/2018 08/07/2017  ?Eye Exam No Retinopathy - No Retinopathy  ?Foot Form Completion - Done -  ? ? ? ?

## 2021-09-26 NOTE — Assessment & Plan Note (Signed)
DASH diet and commitment to daily physical activity for a minimum of 30 minutes discussed and encouraged, as a part of hypertension management. ?The importance of attaining a healthy weight is also discussed. ? ?BP/Weight 09/21/2021 01/11/2021 03/03/2020 08/14/2019 07/07/2019 04/22/2019 02/13/2019  ?Systolic BP 149 124 157 136 166 143 128  ?Diastolic BP 72 78 78 78 76 93 78  ?Wt. (Lbs) 136.4 141 148.04 160 - 155 153  ?BMI 26.64 27.54 28.91 31.25 - 30.27 29.88  ?Some encounter information is confidential and restricted. Go to Review Flowsheets activity to see all data.  ? ? ? ? ?

## 2021-09-26 NOTE — Assessment & Plan Note (Addendum)
Hyperlipidemia:Low fat diet discussed and encouraged.elevated, not at goal ? ? ?Lipid Panel  ?Lab Results  ?Component Value Date  ? CHOL 247 (H) 07/12/2021  ? HDL 71 07/12/2021  ? LDLCALC 154 (H) 07/12/2021  ? TRIG 127 07/12/2021  ? CHOLHDL 3.5 07/12/2021  ? ?elevated ? ? ?

## 2021-09-27 ENCOUNTER — Other Ambulatory Visit: Payer: Self-pay | Admitting: Family Medicine

## 2021-09-27 ENCOUNTER — Encounter: Payer: Self-pay | Admitting: Family Medicine

## 2021-09-27 DIAGNOSIS — M79645 Pain in left finger(s): Secondary | ICD-10-CM

## 2021-09-28 ENCOUNTER — Ambulatory Visit (HOSPITAL_COMMUNITY)
Admission: RE | Admit: 2021-09-28 | Discharge: 2021-09-28 | Disposition: A | Discharge status: Home / Self Care | Payer: PPO | Source: Ambulatory Visit | Attending: Family Medicine | Admitting: Family Medicine

## 2021-09-28 ENCOUNTER — Other Ambulatory Visit: Payer: Self-pay

## 2021-09-28 DIAGNOSIS — M79645 Pain in left finger(s): Secondary | ICD-10-CM | POA: Insufficient documentation

## 2021-09-30 ENCOUNTER — Telehealth: Payer: Self-pay

## 2021-09-30 NOTE — Telephone Encounter (Signed)
Patient called asked are they running behind on reading xrays of her finger images she had done on 03.22.2023. asked if Nurse would give her a call [phone] ?

## 2021-10-03 NOTE — Telephone Encounter (Signed)
Pt seen dr simpsons message to her on mychart on 3/26 ?

## 2021-10-05 DIAGNOSIS — Z1211 Encounter for screening for malignant neoplasm of colon: Secondary | ICD-10-CM | POA: Diagnosis not present

## 2021-10-12 LAB — COLOGUARD: COLOGUARD: NEGATIVE

## 2022-01-19 DIAGNOSIS — M79672 Pain in left foot: Secondary | ICD-10-CM | POA: Diagnosis not present

## 2022-01-23 DIAGNOSIS — M79672 Pain in left foot: Secondary | ICD-10-CM | POA: Diagnosis not present

## 2022-01-23 DIAGNOSIS — L84 Corns and callosities: Secondary | ICD-10-CM | POA: Diagnosis not present

## 2022-01-23 DIAGNOSIS — M21622 Bunionette of left foot: Secondary | ICD-10-CM | POA: Diagnosis not present

## 2022-03-06 ENCOUNTER — Encounter: Payer: Self-pay | Admitting: Family Medicine

## 2022-03-17 DIAGNOSIS — E559 Vitamin D deficiency, unspecified: Secondary | ICD-10-CM | POA: Diagnosis not present

## 2022-03-17 DIAGNOSIS — E119 Type 2 diabetes mellitus without complications: Secondary | ICD-10-CM | POA: Diagnosis not present

## 2022-03-17 DIAGNOSIS — E785 Hyperlipidemia, unspecified: Secondary | ICD-10-CM | POA: Diagnosis not present

## 2022-03-17 DIAGNOSIS — R7303 Prediabetes: Secondary | ICD-10-CM | POA: Diagnosis not present

## 2022-03-17 DIAGNOSIS — E8881 Metabolic syndrome: Secondary | ICD-10-CM | POA: Diagnosis not present

## 2022-03-18 LAB — CBC
Hematocrit: 41.5 % (ref 34.0–46.6)
Hemoglobin: 14.1 g/dL (ref 11.1–15.9)
MCH: 30.7 pg (ref 26.6–33.0)
MCHC: 34 g/dL (ref 31.5–35.7)
MCV: 90 fL (ref 79–97)
Platelets: 176 10*3/uL (ref 150–450)
RBC: 4.6 x10E6/uL (ref 3.77–5.28)
RDW: 12.6 % (ref 11.7–15.4)
WBC: 7.9 10*3/uL (ref 3.4–10.8)

## 2022-03-18 LAB — VITAMIN D 25 HYDROXY (VIT D DEFICIENCY, FRACTURES): Vit D, 25-Hydroxy: 60.2 ng/mL (ref 30.0–100.0)

## 2022-03-18 LAB — CMP14+EGFR
ALT: 15 IU/L (ref 0–32)
AST: 18 IU/L (ref 0–40)
Albumin/Globulin Ratio: 1.9 (ref 1.2–2.2)
Albumin: 4.6 g/dL (ref 3.8–4.8)
Alkaline Phosphatase: 90 IU/L (ref 44–121)
BUN/Creatinine Ratio: 22 (ref 12–28)
BUN: 17 mg/dL (ref 8–27)
Bilirubin Total: 0.9 mg/dL (ref 0.0–1.2)
CO2: 25 mmol/L (ref 20–29)
Calcium: 9.8 mg/dL (ref 8.7–10.3)
Chloride: 102 mmol/L (ref 96–106)
Creatinine, Ser: 0.76 mg/dL (ref 0.57–1.00)
Globulin, Total: 2.4 g/dL (ref 1.5–4.5)
Glucose: 108 mg/dL — ABNORMAL HIGH (ref 70–99)
Potassium: 4.4 mmol/L (ref 3.5–5.2)
Sodium: 141 mmol/L (ref 134–144)
Total Protein: 7 g/dL (ref 6.0–8.5)
eGFR: 83 mL/min/{1.73_m2} (ref >59)

## 2022-03-18 LAB — LIPID PANEL
Chol/HDL Ratio: 3.7 ratio (ref 0.0–4.4)
Cholesterol, Total: 212 mg/dL — ABNORMAL HIGH (ref 100–199)
HDL: 58 mg/dL (ref >39)
LDL Chol Calc (NIH): 131 mg/dL — ABNORMAL HIGH (ref 0–99)
Triglycerides: 127 mg/dL (ref 0–149)
VLDL Cholesterol Cal: 23 mg/dL (ref 5–40)

## 2022-03-18 LAB — TSH: TSH: 1.54 u[IU]/mL (ref 0.450–4.500)

## 2022-03-18 LAB — HEMOGLOBIN A1C
Est. average glucose Bld gHb Est-mCnc: 123 mg/dL
Hgb A1c MFr Bld: 5.9 % — ABNORMAL HIGH (ref 4.8–5.6)

## 2022-03-23 ENCOUNTER — Ambulatory Visit (INDEPENDENT_AMBULATORY_CARE_PROVIDER_SITE_OTHER): Payer: PPO | Admitting: Family Medicine

## 2022-03-23 ENCOUNTER — Encounter: Payer: Self-pay | Admitting: Family Medicine

## 2022-03-23 VITALS — BP 138/82 | HR 82 | Ht 60.0 in | Wt 135.1 lb

## 2022-03-23 DIAGNOSIS — Z23 Encounter for immunization: Secondary | ICD-10-CM

## 2022-03-23 DIAGNOSIS — E785 Hyperlipidemia, unspecified: Secondary | ICD-10-CM

## 2022-03-23 DIAGNOSIS — R7303 Prediabetes: Secondary | ICD-10-CM

## 2022-03-23 DIAGNOSIS — Z1231 Encounter for screening mammogram for malignant neoplasm of breast: Secondary | ICD-10-CM

## 2022-03-23 DIAGNOSIS — Z0000 Encounter for general adult medical examination without abnormal findings: Secondary | ICD-10-CM | POA: Diagnosis not present

## 2022-03-23 NOTE — Patient Instructions (Addendum)
F/u in 6 months, call if you need me sooner  Flu vaccine today  Please get covid booster when available   I am depending on you to make the changes we discussed in your own time in your own way, so you enjoy even more, the blessings  of life that you have  Labs, exam and lifestyle with food choice and exercise are excellent  Use ear plugs to help improve sleep which is VITAL  Fasting lipid and hBA1C 1 week before follow up  Thanks for choosing [facility], we consider it a privelige to serve you.

## 2022-03-26 ENCOUNTER — Encounter: Payer: Self-pay | Admitting: Family Medicine

## 2022-03-26 DIAGNOSIS — Z0000 Encounter for general adult medical examination without abnormal findings: Secondary | ICD-10-CM | POA: Insufficient documentation

## 2022-03-26 NOTE — Assessment & Plan Note (Signed)
Annual exam as documented.  Changes in health habits are decided on by the patient with goals and time frames  set for achieving them. Immunization and cancer screening needs are specifically addressed at this visit.  

## 2022-03-26 NOTE — Progress Notes (Signed)
    Tracy Humphrey     MRN: [id number]      DOB: Apr 16, 1951  HPI: Patient is in for annual physical exam.  Recent labs,  are reviewed. Immunization is reviewed , and  updated if needed.   PE: BP 138/82   Pulse 82   Ht 5' (1.524 m)   Wt 135 lb 1.9 oz (61.3 kg)   SpO2 97%   BMI 26.39 kg/m   Pleasant  female, alert and oriented x 3, in no cardio-pulmonary distress. Afebrile. HEENT No facial trauma or asymetry. Sinuses non tender.  Extra occullar muscles intact.. External ears normal, . Neck: supple, no adenopathy,JVD or thyromegaly.No bruits.  Chest: Clear to ascultation bilaterally.No crackles or wheezes. Non tender to palpation    Cardiovascular system; Heart sounds normal,  S1 and  S2 ,no S3.  No murmur, or thrill. Apical beat not displaced Peripheral pulses normal.  Abdomen: Soft, non tender, ss.   Musculoskeletal exam: Full ROM of spine, hips , shoulders and knees. No deformity ,swelling or crepitus noted. No muscle wasting or atrophy.   Neurologic: Cranial nerves 2 to 12 intact. Power, tone ,sensation and reflexes normal throughout. No disturbance in gait. No tremor.  Skin: Intact, no ulceration, erythema , scaling or rash noted. Pigmentation normal throughout  Psych; Normal mood and affect. Judgement and concentration normal   Assessment & Plan:  Annual visit for general adult medical examination without abnormal findings Annual exam as documented. . Changes in health habits are decided on by the patient with goals and time frames  set for achieving them. Immunization and cancer screening needs are specifically addressed at this visit.

## 2022-05-21 ENCOUNTER — Encounter: Payer: Self-pay | Admitting: Family Medicine

## 2022-06-23 ENCOUNTER — Ambulatory Visit (INDEPENDENT_AMBULATORY_CARE_PROVIDER_SITE_OTHER): Payer: PPO | Admitting: Family Medicine

## 2022-06-23 ENCOUNTER — Encounter: Payer: Self-pay | Admitting: Family Medicine

## 2022-06-23 ENCOUNTER — Telehealth: Payer: Self-pay | Admitting: Family Medicine

## 2022-06-23 ENCOUNTER — Ambulatory Visit (HOSPITAL_COMMUNITY)
Admission: RE | Admit: 2022-06-23 | Discharge: 2022-06-23 | Disposition: A | Discharge status: Home / Self Care | Payer: PPO | Source: Ambulatory Visit | Attending: Family Medicine | Admitting: Family Medicine

## 2022-06-23 DIAGNOSIS — M79605 Pain in left leg: Secondary | ICD-10-CM | POA: Diagnosis not present

## 2022-06-23 DIAGNOSIS — J302 Other seasonal allergic rhinitis: Secondary | ICD-10-CM

## 2022-06-23 DIAGNOSIS — Z8249 Family history of ischemic heart disease and other diseases of the circulatory system: Secondary | ICD-10-CM | POA: Diagnosis not present

## 2022-06-23 NOTE — Telephone Encounter (Signed)
Wanted to report that for the past 3 days she has been woken several times during the night with a throbbing pain in the top of her left thigh. Not hurting today just seems to bother her at night. Wanted to see if you thought she needed an xray/US or what you recommended.

## 2022-06-23 NOTE — Telephone Encounter (Signed)
Tele visit scheduled to discuss

## 2022-06-23 NOTE — Progress Notes (Unsigned)
Virtual Visit via Telephone Note  I connected with Jacki Cones on 06/23/22 at 11:40 AM EST by telephone and verified that I am speaking with the correct person using two identifiers.  Location: Patient: 3 day h/o left leg pain  Provider: office   I discussed the limitations, risks, security and privacy concerns of performing an evaluation and management service by telephone and the availability of in person appointments. I also discussed with the patient that there may be a patient responsible charge related to this service. The patient expressed understanding and agreed to proceed.   History of Present Illness: 3 day h/o left leg pain unprovoked, had simi;ar presentation several years ago when she developed extensive LLE dVT Denies any current SOB or hemoptysis Also expresses concerns re heavy burden of vascular disease in her family, arterial ,though many family members were smokers  Also concern re sinus issues also expressed to Nurse will need to f/u on that with direct messaging    Observations/Objective: Good communication with no confusion and intact memory. No signs of respiratory distress during speech   Assessment and Plan: Left leg pain Acute onset since 06/20/2022, has prior h/o left DVT presenting in similar fashion, stat venous doppler Will recommend tylenol/ aleve, if persists will need to look at arthritic problems causing the symptom  Family history of arterial disease H/o significant arterial disease in several family members (3) refer for evaluation  Seasonal allergies Reports increased symptoms will  send message advising daily loratadine and sudafed if not currently doing this as well as netty pot flush   Follow Up Instructions:    I discussed the assessment and treatment plan with the patient. The patient was provided an opportunity to ask questions and all were answered. The patient agreed with the plan and demonstrated an understanding of the  instructions.   The patient was advised to call back or seek an in-person evaluation if the symptoms worsen or if the condition fails to improve as anticipated.  I provided 13 minutes of non-face-to-face time during this encounter.   Syliva Overman, MD

## 2022-06-23 NOTE — Patient Instructions (Signed)
F/U as before, call if you need to be seen sooner  Doppler study shows no blood clot in your veins , which is good news  Based on your visit today, and the expressed concern about the burden of vascular/ arteria disease in your family, I will refer you to  vascular Surgery in [place]  for evaluation of your arteries  Thanks for choosing [facility], we consider it a privelige to serve you.

## 2022-06-25 ENCOUNTER — Encounter: Payer: Self-pay | Admitting: Family Medicine

## 2022-06-25 DIAGNOSIS — Z8249 Family history of ischemic heart disease and other diseases of the circulatory system: Secondary | ICD-10-CM | POA: Insufficient documentation

## 2022-06-25 NOTE — Assessment & Plan Note (Addendum)
Acute onset since 06/20/2022, has prior h/o left DVT presenting in similar fashion, stat venous doppler Will recommend tylenol/ aleve, if persists will need to look at arthritic problems causing the symptom

## 2022-06-25 NOTE — Assessment & Plan Note (Signed)
Reports increased symptoms will  send message advising daily loratadine and sudafed if not currently doing this as well as netty pot flush

## 2022-06-25 NOTE — Assessment & Plan Note (Signed)
H/o significant arterial disease in several family members (3) refer for evaluation

## 2022-07-12 ENCOUNTER — Encounter: Payer: Self-pay | Admitting: Vascular Surgery

## 2022-07-12 ENCOUNTER — Other Ambulatory Visit: Payer: Self-pay

## 2022-07-12 ENCOUNTER — Ambulatory Visit (INDEPENDENT_AMBULATORY_CARE_PROVIDER_SITE_OTHER): Payer: PPO | Admitting: Vascular Surgery

## 2022-07-12 ENCOUNTER — Ambulatory Visit (HOSPITAL_COMMUNITY)
Admission: RE | Admit: 2022-07-12 | Discharge: 2022-07-12 | Disposition: A | Discharge status: Home / Self Care | Payer: PPO | Source: Ambulatory Visit | Attending: Family Medicine | Admitting: Family Medicine

## 2022-07-12 ENCOUNTER — Ambulatory Visit (INDEPENDENT_AMBULATORY_CARE_PROVIDER_SITE_OTHER): Payer: PPO

## 2022-07-12 VITALS — BP 144/82 | HR 80 | Temp 98.4°F | Ht 60.0 in | Wt 140.0 lb

## 2022-07-12 DIAGNOSIS — I739 Peripheral vascular disease, unspecified: Secondary | ICD-10-CM

## 2022-07-12 DIAGNOSIS — Z1231 Encounter for screening mammogram for malignant neoplasm of breast: Secondary | ICD-10-CM | POA: Insufficient documentation

## 2022-07-12 DIAGNOSIS — M25562 Pain in left knee: Secondary | ICD-10-CM

## 2022-07-12 NOTE — Progress Notes (Signed)
[facility] [place]  Patient name: Tracy Humphrey MRN: [id number] DOB: [birth year] Sex: female  REASON FOR CONSULT: Evaluation discomfort left leg, strong family history of peripheral vascular and cardiac disease  HPI: ALAIA LORDI is a 72 y.o. female, who is here today for evaluation.  He has a very strong history of premature cardiac and peripheral vascular occlusive disease in her family.  She recently had some discomfort in her anterior thigh and is here today for evaluation to rule out any evidence of arterial insufficiency.  He is quite active.  She does not smoke or drink alcohol.  She walks on a treadmill daily and is very conscious of her diet.  She does have a history in 2015 of superficial thrombophlebitis in her left great saphenous vein.  No history of DVT.  Past Medical History:  Diagnosis Date   Allergy    prrenial   Arthritis    BPV (benign positional vertigo) 11/04/2011   GERD (gastroesophageal reflux disease)    Helicobacter pylori ab+ 2014   treated by GI   Hyperlipidemia 2013   IGT (impaired glucose tolerance) 2013   Meniere disease    Mild diastolic dysfunction 02/6577   grade 2   Obesity 2007    Prior to this had no weight issue    Polymyalgia rheumatica ([place]) 06/14/2017   Superficial thrombophlebitis 2015   Vertigo     Family History  Problem Relation Age of Onset   Dementia Mother 65   Hypertension Mother    Hypertension Father    Heart disease Father    Diabetes Father    AAA (abdominal aortic aneurysm) Father    Hypertension Sister    Coronary artery disease Sister 15   Hypertension Brother 93   Alcohol abuse Brother    AAA (abdominal aortic aneurysm) Paternal Grandfather     SOCIAL HISTORY: Social History   Socioeconomic History   Marital status: Married    Spouse name: Not on file   Number of children: Not on file   Years of education: Not on file   Highest education level: Not on  file  Occupational History   Occupation: [employer]   Tobacco Use   Smoking status: Never   Smokeless tobacco: Never   Tobacco comments:    Never smoke  Vaping Use   Vaping Use: Former  Substance and Sexual Activity   Alcohol use: No   Drug use: No   Sexual activity: Yes    Birth control/protection: Post-menopausal  Other Topics Concern   Not on file  Social History Narrative   Not on file   Social Determinants of Health   Financial Resource Strain: Not on file  Food Insecurity: Not on file  Transportation Needs: Not on file  Physical Activity: Not on file  Stress: Not on file  Social Connections: Not on file  Intimate Partner Violence: Not on file    Allergies  Allergen Reactions   Levaquin [Levofloxacin In D5w] Anaphylaxis    Generalized pain   Prednisone Other (See Comments)    All steroids, 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     Current Outpatient Medications  Medication Sig Dispense Refill   aspirin 81 MG EC tablet Take 81 mg by mouth daily.       calcium carbonate (TUMS - DOSED IN MG ELEMENTAL CALCIUM) 500 MG chewable tablet Chew 2 tablets by mouth daily.       Isopropyl Alcohol 95 % LIQD Place 2 drops into both ears once a week.      loratadine (CLARITIN) 10 MG tablet Take 1 tablet (10 mg total) by mouth daily. 90 tablet 3   Multiple Vitamin (TAB-A-VITE) TABS 1 tablet daily 90 tablet 3   naproxen sodium (ALEVE) 220 MG tablet Take 220 mg by mouth daily as needed (pain).      phenylephrine (SUDAFED PE) 10 MG  TABS tablet Take 10 mg by mouth daily.     No current facility-administered medications for this visit.    REVIEW OF SYSTEMS:  [X]  denotes positive finding, [ ]  denotes negative finding Cardiac  Comments:  Chest pain or chest pressure:    Shortness of breath upon exertion:    Short of breath when lying flat:    Irregular heart rhythm:        Vascular    Pain in calf, thigh, or hip brought on by ambulation:    Pain in feet at night that wakes you up from your sleep:     Blood clot in your veins:    Leg swelling:         Pulmonary    Oxygen at home:    Productive cough:     Wheezing:         Neurologic    Sudden weakness in arms or legs:     Sudden numbness in arms or legs:     Sudden onset of difficulty speaking or slurred speech:    Temporary loss of vision in one eye:     Problems with dizziness:         Gastrointestinal    Blood in stool:     Vomited blood:         Genitourinary    Burning when urinating:     Blood in urine:        Psychiatric    Major depression:         Hematologic    Bleeding problems:    Problems with blood clotting too easily:        Skin    Rashes or ulcers:        Constitutional    Fever or chills:      PHYSICAL EXAM: Vitals:   07/12/22 1209  BP: (!) 144/82  Pulse: 80  Temp: 98.4 F (36.9 C)  SpO2: 99%  Weight: 140 lb (63.5 kg)  Height: 5' (1.524 m)    GENERAL: The patient is a well-nourished female, in no acute distress. The vital signs are documented above. CARDIOVASCULAR: Carotid arteries without bruits bilaterally.  2+ radial pulses bilaterally.  No palpable aneurysm on abdominal exam.  2+ popliteal and 2+ dorsalis pedis pulses bilaterally. PULMONARY: There is good air exchange  MUSCULOSKELETAL: There are no major deformities or cyanosis. NEUROLOGIC: No focal weakness or paresthesias are detected. SKIN: There are no ulcers or rashes noted. PSYCHIATRIC: The patient has a normal affect.  DATA:  Noninvasive studies  today reveal ankle arm index of normal at 1.0 bilaterally.  MEDICAL ISSUES: No evidence of lower extremity  arterial insufficiency.  She was reassured with this finding and will see [place] again on an daily basis   Larina Earthly, MD [facility] [phone] Pager [phone]  Note: Portions of this report may have been transcribed using voice recognition software.  Every effort has been made to ensure accuracy; however, inadvertent computerized transcription errors may still be present.

## 2022-07-13 ENCOUNTER — Encounter: Payer: Self-pay | Admitting: Family Medicine

## 2022-07-13 NOTE — Telephone Encounter (Signed)
Patient aware mammogram normal

## 2022-09-15 DIAGNOSIS — R7303 Prediabetes: Secondary | ICD-10-CM | POA: Diagnosis not present

## 2022-09-15 DIAGNOSIS — E785 Hyperlipidemia, unspecified: Secondary | ICD-10-CM | POA: Diagnosis not present

## 2022-09-16 LAB — HEMOGLOBIN A1C
Est. average glucose Bld gHb Est-mCnc: 126 mg/dL
Hgb A1c MFr Bld: 6 % — ABNORMAL HIGH (ref 4.8–5.6)

## 2022-09-16 LAB — LIPID PANEL
Chol/HDL Ratio: 3.6 ratio (ref 0.0–4.4)
Cholesterol, Total: 246 mg/dL — ABNORMAL HIGH (ref 100–199)
HDL: 68 mg/dL (ref >39)
LDL Chol Calc (NIH): 153 mg/dL — ABNORMAL HIGH (ref 0–99)
Triglycerides: 140 mg/dL (ref 0–149)
VLDL Cholesterol Cal: 25 mg/dL (ref 5–40)

## 2022-09-22 ENCOUNTER — Encounter: Payer: Self-pay | Admitting: Family Medicine

## 2022-09-22 ENCOUNTER — Ambulatory Visit (INDEPENDENT_AMBULATORY_CARE_PROVIDER_SITE_OTHER): Payer: PPO | Admitting: Family Medicine

## 2022-09-22 VITALS — BP 129/77 | HR 79 | Ht 60.0 in | Wt 138.1 lb

## 2022-09-22 DIAGNOSIS — R030 Elevated blood-pressure reading, without diagnosis of hypertension: Secondary | ICD-10-CM

## 2022-09-22 DIAGNOSIS — E559 Vitamin D deficiency, unspecified: Secondary | ICD-10-CM | POA: Diagnosis not present

## 2022-09-22 DIAGNOSIS — E785 Hyperlipidemia, unspecified: Secondary | ICD-10-CM | POA: Diagnosis not present

## 2022-09-22 DIAGNOSIS — J302 Other seasonal allergic rhinitis: Secondary | ICD-10-CM | POA: Diagnosis not present

## 2022-09-22 DIAGNOSIS — R7303 Prediabetes: Secondary | ICD-10-CM

## 2022-09-22 DIAGNOSIS — E663 Overweight: Secondary | ICD-10-CM | POA: Diagnosis not present

## 2022-09-22 NOTE — Patient Instructions (Signed)
Annual exam September 15 or after, call if you need me sooner  Fasting CBC, lipid, , cmp and EGFr, TSH, vit D and HBA1C 3 to 5 days before visit  It is important that you exercise regularly at least 30 minutes 5 times a week. If you develop chest pain, have severe difficulty breathing, or feel very tired, stop exercising immediately and seek medical attention   Think about what you will eat, plan ahead. Choose " clean, green, fresh or frozen" over canned, processed or packaged foods which are more sugary, salty and fatty. 70 to 75% of food eaten should be vegetables and fruit. Three meals at set times with snacks allowed between meals, but they must be fruit or vegetables. Aim to eat over a 12 hour period , example 7 am to 7 pm, and STOP after  your last meal of the day. Drink water,generally about 64 ounces per day, no other drink is as healthy. Fruit juice is best enjoyed in a healthy way, by EATING the fruit.   Thanks for choosing [facility], we consider it a privelige to serve you.

## 2022-09-25 ENCOUNTER — Encounter: Payer: Self-pay | Admitting: Family Medicine

## 2022-09-25 DIAGNOSIS — E559 Vitamin D deficiency, unspecified: Secondary | ICD-10-CM | POA: Insufficient documentation

## 2022-09-25 NOTE — Assessment & Plan Note (Signed)
Updated lab needed at/ before next visit.   

## 2022-09-25 NOTE — Assessment & Plan Note (Signed)
  Patient re-educated about  the importance of commitment to a  minimum of 150 minutes of exercise per week as able.  The importance of healthy food choices with portion control discussed, as well as eating regularly and within a 12 hour window most days. The need to choose "clean , green" food 50 to 75% of the time is discussed, as well as to make water the primary drink and set a goal of 64 ounces water daily.       09/22/2022    9:29 AM 07/12/2022   12:09 PM 03/23/2022    9:22 AM  Weight /BMI  Weight 138 lb 1.9 oz 140 lb 135 lb 1.9 oz  Height 5' (1.524 m) 5' (1.524 m) 5' (1.524 m)  BMI 26.97 kg/m2 27.34 kg/m2 26.39 kg/m2

## 2022-09-25 NOTE — Assessment & Plan Note (Signed)
Hyperlipidemia:Low fat diet discussed and encouraged.   Lipid Panel  Lab Results  Component Value Date   CHOL 246 (H) 09/15/2022   HDL 68 09/15/2022   LDLCALC 153 (H) 09/15/2022   TRIG 140 09/15/2022   CHOLHDL 3.6 09/15/2022     Deteriorated , needs to change food choice

## 2022-09-25 NOTE — Assessment & Plan Note (Addendum)
Deteriorated, re eval in 6 to 7 months .dIncrease exercise and , change food choice update in 6 to 7 months Growth %ile SmartLinks can only be used for patients less than 74 years old.

## 2022-09-25 NOTE — Assessment & Plan Note (Signed)
Increased symptoms as Spring approaches, recommend face mask ouitside , daily loratidine , up to 2 sudafed / day as needed, call if worsened

## 2022-09-25 NOTE — Progress Notes (Signed)
Tracy Humphrey     MRN: [id number]      DOB: 06-18-1951   HPI Tracy Humphrey is here for follow up and re-evaluation of chronic medical conditions, medication management and review of any available recent lab and radiology data.  Preventive health is updated, specifically  Cancer screening and Immunization.   Improved stress in past approx 4 weeks, however , in the interim since last visit , a lot of stress which hse ios trying to adjust to and has been eating more cookies thn in the pat, labs tell the story, she intends t change this ROS Still walking , will increase this as weather improvesDenies recent fever or chills. Denies sinus pressure, nasal congestion, ear pain or sore throat. Denies chest congestion, productive cough or wheezing. Denies chest pains, palpitations and leg swelling Denies abdominal pain, nausea, vomiting,diarrhea or constipation.   Denies dysuria, frequency, hesitancy or incontinence. Denies uncontrolled joint pain, swelling and limitation in mobility. Denies headaches, seizures, numbness, or tingling. Denies depression, chronic anxiety  which she maintains a high level of function with difficulty sleeping , poor quality of sleep which she attributes to noise disturbance insomnia. Denies skin break down or rash.   PE  BP 129/77 (BP Location: Right Arm, Patient Position: Sitting, Cuff Size: Normal)   Pulse 79   Ht 5' (1.524 m)   Wt 138 lb 1.9 oz (62.7 kg)   SpO2 98%   BMI 26.97 kg/m   Patient alert and oriented and in no cardiopulmonary distress.  HEENT: No facial asymmetry, EOMI,     Neck supple .  Chest: Clear to auscultation bilaterally.  CVS: S1, S2 no murmurs, no S3.Regular rate.  ABD: Soft non tender.   Ext: No edema  MS: Adequate ROM spine, shoulders, hips and knees.  Skin: Intact, no ulcerations or rash noted.  Psych: Good eye contact, normal affect. Memory intact not anxious or depressed appearing.  CNS: CN 2-12 intact, power,  normal  throughout.no focal deficits noted.   Assessment & Plan  Prediabetes Deteriorated, re eval in 6 to 7 months .dIncrease exercise and , change food choice update in 6 to 7 months Growth %ile SmartLinks can only be used for patients less than 63 years old.   Seasonal allergies Increased symptoms as Spring approaches, recommend face mask ouitside , daily loratidine , up to 2 sudafed / day as needed, call if worsened  Overweight (BMI 25.0-29.9)  Patient re-educated about  the importance of commitment to a  minimum of 150 minutes of exercise per week as able.  The importance of healthy food choices with portion control discussed, as well as eating regularly and within a 12 hour window most days. The need to choose "clean , green" food 50 to 75% of the time is discussed, as well as to make water the primary drink and set a goal of 64 ounces water daily.       09/22/2022    9:29 AM 07/12/2022   12:09 PM 03/23/2022    9:22 AM  Weight /BMI  Weight 138 lb 1.9 oz 140 lb 135 lb 1.9 oz  Height 5' (1.524 m) 5' (1.524 m) 5' (1.524 m)  BMI 26.97 kg/m2 27.34 kg/m2 26.39 kg/m2      Dyslipidemia, goal LDL below 100 Hyperlipidemia:Low fat diet discussed and encouraged.   Lipid Panel  Lab Results  Component Value Date   CHOL 246 (H) 09/15/2022   HDL 68 09/15/2022   LDLCALC 153 (H) 09/15/2022  TRIG 140 09/15/2022   CHOLHDL 3.6 09/15/2022     Deteriorated , needs to change food choice  Vitamin D deficiency Updated lab needed at/ before next visit.

## 2023-02-27 DIAGNOSIS — H43393 Other vitreous opacities, bilateral: Secondary | ICD-10-CM | POA: Diagnosis not present

## 2023-03-21 DIAGNOSIS — R030 Elevated blood-pressure reading, without diagnosis of hypertension: Secondary | ICD-10-CM | POA: Diagnosis not present

## 2023-03-21 DIAGNOSIS — R7303 Prediabetes: Secondary | ICD-10-CM | POA: Diagnosis not present

## 2023-03-21 DIAGNOSIS — E559 Vitamin D deficiency, unspecified: Secondary | ICD-10-CM | POA: Diagnosis not present

## 2023-03-21 DIAGNOSIS — E785 Hyperlipidemia, unspecified: Secondary | ICD-10-CM | POA: Diagnosis not present

## 2023-03-27 ENCOUNTER — Ambulatory Visit (INDEPENDENT_AMBULATORY_CARE_PROVIDER_SITE_OTHER): Payer: PPO | Admitting: Family Medicine

## 2023-03-27 ENCOUNTER — Encounter: Payer: Self-pay | Admitting: Family Medicine

## 2023-03-27 VITALS — BP 158/84 | HR 81 | Ht 60.0 in | Wt 139.1 lb

## 2023-03-27 DIAGNOSIS — Z8249 Family history of ischemic heart disease and other diseases of the circulatory system: Secondary | ICD-10-CM

## 2023-03-27 DIAGNOSIS — Z23 Encounter for immunization: Secondary | ICD-10-CM | POA: Diagnosis not present

## 2023-03-27 DIAGNOSIS — E559 Vitamin D deficiency, unspecified: Secondary | ICD-10-CM | POA: Diagnosis not present

## 2023-03-27 DIAGNOSIS — R7303 Prediabetes: Secondary | ICD-10-CM

## 2023-03-27 DIAGNOSIS — Z780 Asymptomatic menopausal state: Secondary | ICD-10-CM | POA: Diagnosis not present

## 2023-03-27 DIAGNOSIS — Z1231 Encounter for screening mammogram for malignant neoplasm of breast: Secondary | ICD-10-CM

## 2023-03-27 DIAGNOSIS — R030 Elevated blood-pressure reading, without diagnosis of hypertension: Secondary | ICD-10-CM | POA: Diagnosis not present

## 2023-03-27 DIAGNOSIS — E785 Hyperlipidemia, unspecified: Secondary | ICD-10-CM | POA: Diagnosis not present

## 2023-03-27 DIAGNOSIS — Z0001 Encounter for general adult medical examination with abnormal findings: Secondary | ICD-10-CM | POA: Diagnosis not present

## 2023-03-27 NOTE — Patient Instructions (Addendum)
Follow-up in 6 months, call if you need me sooner.  Please schedule DEXA at checkout.  Please schedule mammogram at checkout  Fasting lipid, chem 7 and EGFGR and hBA1C 1 week before your March appointment  Flu vaccine in office today.  You do need the Tdap, COVID-19, and RSV vaccines.  All of these you can get through your local pharmacy.  Continue excellent health habits of regular exercise and healthy food choices.  Your blood pressure continues to be high in the office we have as it may well be normal at home.  I recommend you bring your blood pressure cuff to all medical appointments.  You are being referred to cardiology for evaluation for cardiovascular disease.  You do have 2 siblings with heart disease and your cholesterol is above range as is your average blood sugar.  Because of your dedication to health and commitment you are controlling these to a great extent and I commend you for this and encourage you to continue.

## 2023-03-28 ENCOUNTER — Encounter: Payer: Self-pay | Admitting: Family Medicine

## 2023-03-28 DIAGNOSIS — Z0001 Encounter for general adult medical examination with abnormal findings: Secondary | ICD-10-CM | POA: Insufficient documentation

## 2023-03-28 NOTE — Assessment & Plan Note (Signed)
Patient educated about the importance of limiting  Carbohydrate intake , the need to commit to daily physical activity for a minimum of 30 minutes , and to commit weight loss. The fact that changes in all these areas will reduce or eliminate all together the development of diabetes is stressed.      Latest Ref Rng & Units 03/21/2023    8:27 AM 09/15/2022    8:43 AM 03/17/2022    8:27 AM 07/12/2021    8:20 AM 03/09/2021    8:04 AM  Diabetic Labs  HbA1c 4.8 - 5.6 % 5.9  6.0  5.9  5.8  5.8   Chol 100 - 199 mg/dL 644  034  742  595  638   HDL >39 mg/dL 63  68  58  71  55   Calc LDL 0 - 99 mg/dL 756  433  295  188  416   Triglycerides 0 - 149 mg/dL 97  606  301  601  093   Creatinine 0.57 - 1.00 mg/dL 2.35   5.73   2.20       03/27/2023    9:54 AM 03/27/2023    9:42 AM 03/27/2023    9:41 AM 09/22/2022    9:29 AM 07/12/2022   12:09 PM 03/23/2022   10:16 AM 03/23/2022    9:23 AM  BP/Weight  Systolic BP 158 166 174 129 144 138 144  Diastolic BP 84 76 84 77 82 82 80  Wt. (Lbs)   139.12 138.12 140    BMI   27.17 kg/m2 26.97 kg/m2 27.34 kg/m2        Latest Ref Rng & Units 06/17/2018    1:20 PM 08/07/2017   12:00 AM  Foot/eye exam completion dates  Eye Exam No Retinopathy  No Retinopathy      Foot Form Completion  Done      This result is from an external source.    Improved , encouraged to contin ue same

## 2023-03-28 NOTE — Assessment & Plan Note (Signed)
Annual exam as documented. Counseling done  re healthy lifestyle involving commitment to 150 minutes exercise per week, heart healthy diet, and attaining healthy weight.The importance of adequate sleep also discussed.  Immunization and cancer screening needs are specifically addressed at this visit.

## 2023-03-28 NOTE — Assessment & Plan Note (Addendum)
DASH diet and commitment to daily physical activity for a minimum of 30 minutes discussed and encouraged, as a part of hypertension management. The importance of attaining a healthy weight is also discussed.     03/27/2023    9:54 AM 03/27/2023    9:42 AM 03/27/2023    9:41 AM 09/22/2022    9:29 AM 07/12/2022   12:09 PM 03/23/2022   10:16 AM 03/23/2022    9:23 AM  BP/Weight  Systolic BP 158 166 174 129 144 138 144  Diastolic BP 84 76 84 77 82 82 80  Wt. (Lbs)   139.12 138.12 140    BMI   27.17 kg/m2 26.97 kg/m2 27.34 kg/m2       Reports normal blood pressure readings at home, encouraged to bring her cuff to next visit, also referred to Cardiology for evaluation

## 2023-03-28 NOTE — Assessment & Plan Note (Signed)
Hyperlipidemia:Low fat diet discussed and encouraged.   Lipid Panel  Lab Results  Component Value Date   CHOL 212 (H) 03/21/2023   HDL 63 03/21/2023   LDLCALC 132 (H) 03/21/2023   TRIG 97 03/21/2023   CHOLHDL 3.4 03/21/2023     Improved, continue dietary managment

## 2023-03-28 NOTE — Assessment & Plan Note (Signed)
Refer for cardiology evaluation

## 2023-04-07 ENCOUNTER — Encounter: Payer: Self-pay | Admitting: Family Medicine

## 2023-05-18 ENCOUNTER — Ambulatory Visit (INDEPENDENT_AMBULATORY_CARE_PROVIDER_SITE_OTHER): Payer: PPO | Admitting: Family Medicine

## 2023-05-18 ENCOUNTER — Other Ambulatory Visit (HOSPITAL_COMMUNITY)
Admission: RE | Admit: 2023-05-18 | Discharge: 2023-05-18 | Disposition: A | Discharge status: Home / Self Care | Payer: PPO | Source: Ambulatory Visit | Attending: Family Medicine | Admitting: Family Medicine

## 2023-05-18 ENCOUNTER — Encounter: Payer: Self-pay | Admitting: Family Medicine

## 2023-05-18 ENCOUNTER — Telehealth: Payer: Self-pay | Admitting: Family Medicine

## 2023-05-18 VITALS — BP 151/76 | HR 87 | Ht 60.0 in | Wt 141.1 lb

## 2023-05-18 DIAGNOSIS — R300 Dysuria: Secondary | ICD-10-CM

## 2023-05-18 DIAGNOSIS — N9489 Other specified conditions associated with female genital organs and menstrual cycle: Secondary | ICD-10-CM

## 2023-05-18 DIAGNOSIS — R030 Elevated blood-pressure reading, without diagnosis of hypertension: Secondary | ICD-10-CM | POA: Diagnosis not present

## 2023-05-18 MED ORDER — PREMARIN 0.625 MG/GM VA CREA: TOPICAL_CREAM | VAGINAL | 0 refills | Status: DC

## 2023-05-18 NOTE — Patient Instructions (Addendum)
F/u as before, call if you need me sooner  Urine is being sent for testing  for infection, no antibiotic has been prescribed    Please submit swab  , self collected , to test for yeast, and bacterial vaginosis. No medication has been prescribed for yeast   Premarin cream has been prescribed , pls use as directed    You are referred to Gynecology for further evaluation, [facility]) that office will contact you. If symptoms are cleared up before the gyne appointment you may cancel .   I recommend taking your home BP cuff to your Cardiology appointment since your blood pressure in the office today is again elevated, you MAY be someone with true "white coat syndrome"  Thanks for choosing [facility], we consider it a privelige to serve you.

## 2023-05-18 NOTE — Progress Notes (Unsigned)
   Tracy Humphrey     MRN: [id number]      DOB: 1950/11/23  Chief Complaint  Patient presents with   Follow-up    Possible UTI/yeast infection     HPI Tracy Humphrey is here with a 3 week h/o irritation of vulva after urination started after attempting a douche  , reports hot water hit her , she jerked and since then has been symptomatic. Has used monistyat, peroxide, cotton liners , nio reliefROS Denies recent fever or chills. Denies sinus pressure, nasal congestion, ear pain or sore throat. Denies chest congestion, productive cough or wheezing. Denies chest pains, palpitations and leg swelling Denies abdominal pain, nausea, vomiting,diarrhea or constipation.   Denies dysuria, frequency, hesitancy or incontinence. Denies joint pain, swelling and limitation in mobility. Denies headaches, seizures, numbness, or tingling. Denies depression, anxiety or insomnia. Denies skin break down or rash.   PE  BP (!) 151/76 (BP Location: Left Arm, Patient Position: Sitting, Cuff Size: Large)   Pulse 87   Ht 5' (1.524 m)   Wt 141 lb 1.9 oz (64 kg)   SpO2 98%   BMI 27.56 kg/m   Patient alert and oriented and in no cardiopulmonary distress.  HEENT: No facial asymmetry, EOMI,     Neck supple .  Chest: Clear to auscultation bilaterally.  CVS: S1, S2 no murmurs, no S3.Regular rate.  ABD: Soft non tender.   Ext: No edema  MS: Adequate ROM spine, shoulders, hips and knees.  Skin: Intact, no ulcerations or rash noted.  Psych: Good eye contact, normal affect. Memory intact not anxious or depressed appearing.  CNS: CN 2-12 intact, power,  normal throughout.no focal deficits noted.   Assessment & Plan  No problem-specific Assessment & Plan notes found for this encounter.

## 2023-05-18 NOTE — Telephone Encounter (Signed)
Patient called request Coleta OBYGN in [facility] like to see a female. Call back # [phone] just to confirm this will be in [place] at [facility].

## 2023-05-20 ENCOUNTER — Encounter: Payer: Self-pay | Admitting: Family Medicine

## 2023-05-20 LAB — URINE CULTURE

## 2023-05-21 ENCOUNTER — Telehealth: Payer: Self-pay

## 2023-05-21 ENCOUNTER — Other Ambulatory Visit: Payer: Self-pay

## 2023-05-21 DIAGNOSIS — R300 Dysuria: Secondary | ICD-10-CM | POA: Insufficient documentation

## 2023-05-21 DIAGNOSIS — N9489 Other specified conditions associated with female genital organs and menstrual cycle: Secondary | ICD-10-CM | POA: Insufficient documentation

## 2023-05-21 LAB — CERVICOVAGINAL ANCILLARY ONLY
Bacterial Vaginitis (gardnerella): NEGATIVE
Candida Glabrata: NEGATIVE
Candida Vaginitis: NEGATIVE
Comment: NEGATIVE
Comment: NEGATIVE
Comment: NEGATIVE

## 2023-05-21 NOTE — Assessment & Plan Note (Signed)
Persitently elevated reading , has upcoming Cardiology eval, she is encouraged to take home bP cuff to visit Does have increased anxiety and possibly white coat syndrome DASH diet and commitment to daily physical activity for a minimum of 30 minutes discussed and encouraged, as a part of hypertension management. The importance of attaining a healthy weight is also discussed.     05/18/2023    8:44 AM 05/18/2023    8:43 AM 03/27/2023    9:54 AM 03/27/2023    9:42 AM 03/27/2023    9:41 AM 09/22/2022    9:29 AM 07/12/2022   12:09 PM  BP/Weight  Systolic BP 151 168 158 166 174 129 144  Diastolic BP 76 83 84 76 84 77 82  Wt. (Lbs)  141.12   139.12 138.12 140  BMI  27.56 kg/m2   27.17 kg/m2 26.97 kg/m2 27.34 kg/m2

## 2023-05-21 NOTE — Assessment & Plan Note (Signed)
Symptom triggered nby  recent trauma while attempting to douche , also involved hot water scalding possibly, trial of premarin, and gyne eval / referral entered if persists

## 2023-05-21 NOTE — Assessment & Plan Note (Signed)
CCUA and c/s if abnormal

## 2023-05-22 ENCOUNTER — Encounter: Payer: Self-pay | Admitting: Family Medicine

## 2023-05-22 LAB — URINALYSIS
Bilirubin, UA: NEGATIVE
Glucose, UA: NEGATIVE
Ketones, UA: NEGATIVE
Nitrite, UA: NEGATIVE
Protein,UA: NEGATIVE
RBC, UA: NEGATIVE
Specific Gravity, UA: 1.02 (ref 1.005–1.030)
Urobilinogen, Ur: 0.2 mg/dL (ref 0.2–1.0)
pH, UA: 5.5 (ref 5.0–7.5)

## 2023-05-24 ENCOUNTER — Encounter: Payer: Self-pay | Admitting: Adult Health

## 2023-05-24 ENCOUNTER — Ambulatory Visit: Payer: PPO | Admitting: Adult Health

## 2023-05-24 VITALS — BP 156/81 | HR 77 | Ht 60.0 in | Wt 143.0 lb

## 2023-05-24 DIAGNOSIS — N9089 Other specified noninflammatory disorders of vulva and perineum: Secondary | ICD-10-CM

## 2023-05-24 DIAGNOSIS — L900 Lichen sclerosus et atrophicus: Secondary | ICD-10-CM

## 2023-05-24 DIAGNOSIS — Z1331 Encounter for screening for depression: Secondary | ICD-10-CM | POA: Diagnosis not present

## 2023-05-24 DIAGNOSIS — N9489 Other specified conditions associated with female genital organs and menstrual cycle: Secondary | ICD-10-CM

## 2023-05-24 DIAGNOSIS — R030 Elevated blood-pressure reading, without diagnosis of hypertension: Secondary | ICD-10-CM | POA: Diagnosis not present

## 2023-05-24 MED ORDER — CLOBETASOL PROPIONATE 0.05 % EX OINT: TOPICAL_OINTMENT | CUTANEOUS | 2 refills | Status: DC

## 2023-05-24 NOTE — Progress Notes (Signed)
Subjective:     Patient ID: Tracy Humphrey, female   DOB: [birth year], 72 y.o.   MRN: [id number]  HPI Tracy Humphrey is a 72 year old white female, married, PM in complaining of vulva burning for 2-3 weeks now, had tried to use douche and turned to cut water off and may have jabbed tip too hard. Burns when urine hits it and more at night. Has seen Dr Tracy Humphrey and was prescribed premarin vaginal cream and feels better,has sat in warm water with epsom salt and used ice pack. Had negative urine, and vaginal swab for BV and yeast.   PCP is Dr Tracy Humphrey   Review of Systems +Vulva burning for 2-3 weeks now, had tried to use douche and turned to cut water off and may have jabbed tip too hard. Burns when urine hits it and more at night   Does not hurt to sit or walk (she walks about 5 miles per day) She is not sexually active  Reviewed past medical,surgical, social and family history. Reviewed medications and allergies.  Objective:   Physical Exam BP (!) 156/81 (BP Location: Left Arm, Patient Position: Sitting, Cuff Size: Normal)   Pulse 77   Ht 5' (1.524 m)   Wt 143 lb (64.9 kg)   BMI 27.93 kg/m     Skin warm and dry.Pelvic: external genitalia, vulva is mildly red, and swollen, with area of excoriation on right, that is about 4 mm and on the left labia, about 1 cm, with thickening of skin, Dr Tracy Humphrey in for co exam,  vagina: pale, urethra has no lesions or masses noted, cervix:smooth, uterus: normal size, shape and contour, non tender, no masses felt, adnexa: no masses or tenderness noted. Bladder is non tender and no masses felt.  AA is 0 Fall risk is low    05/24/2023    2:05 PM 05/18/2023    8:44 AM 03/27/2023    9:42 AM  Depression screen PHQ 2/9  Decreased Interest 0 0 0  Down, Depressed, Hopeless 1 0 0  PHQ - 2 Score 1 0 0  Altered sleeping 1    Tired, decreased energy 0    Change in appetite 0    Feeling bad or failure about yourself  0    Trouble concentrating 0    Moving slowly or  fidgety/restless 0    Suicidal thoughts 0    PHQ-9 Score 2         05/24/2023    2:11 PM 05/18/2023    8:44 AM 03/27/2023    9:42 AM  GAD 7 : Generalized Anxiety Score  Nervous, Anxious, on Edge 1 0 1  Control/stop worrying 1 1 0  Worry too much - different things 1 1 0  Trouble relaxing 1 0 0  Restless 0 0 0  Easily annoyed or irritable 0 0 0  Afraid - awful might happen 1 0 0  Total GAD 7 Score 5 2 1   Anxiety Difficulty  Not difficult at all Not difficult at all    Examination chaperoned by Tracy Apley RN  Assessment:     1. Burning sensation of vulva Burning for about 2-3 weeks now Limit ice to 10 minutes   2. Vulvar irritation Better since using PVC   3. Lichen sclerosus vulva is mildly red, and swollen, with area of excoriation on right, that is about 4 mm and on the left labia, about 1 cm, with thickening of skin Will rx temovate bid for 2 weeks then  2-3 x weekly Meds ordered this encounter  Medications   clobetasol ointment (TEMOVATE) 0.05 %    Sig: Apply thin application bid for 2 weeks then 2-3 x weekly    Dispense:  30 g    Refill:  2    Order Specific Question:   Supervising Provider    Answer:   Tracy Humphrey [[id number]]   Showed pictures in [facility]at[facility]  Review handout on lichen sclerosus   4. Elevated blood pressure reading in office without diagnosis of hypertension Keep check on BP at home    Plan:     Follow up with Dr Tracy Humphrey in 2 months, if not resolved, may need punch biopsy

## 2023-05-30 NOTE — Telephone Encounter (Signed)
Encounter made in error. 

## 2023-06-05 ENCOUNTER — Encounter: Payer: Self-pay | Admitting: Internal Medicine

## 2023-06-05 ENCOUNTER — Ambulatory Visit: Payer: PPO | Attending: Internal Medicine | Admitting: Internal Medicine

## 2023-06-05 VITALS — BP 138/82 | HR 88 | Ht 60.0 in | Wt 139.0 lb

## 2023-06-05 DIAGNOSIS — Z136 Encounter for screening for cardiovascular disorders: Secondary | ICD-10-CM

## 2023-06-05 DIAGNOSIS — E785 Hyperlipidemia, unspecified: Secondary | ICD-10-CM | POA: Diagnosis not present

## 2023-06-05 NOTE — Progress Notes (Signed)
Cardiology Office Note  Date: 06/05/2023   ID: Tracy Humphrey, DOB [birth year], MRN [id number]  PCP:  Kerri Perches, MD  Cardiologist:  Marjo Bicker, MD Electrophysiologist:  None   History of Present Illness: Tracy Humphrey is a 72 y.o. female with no significant past medical history was referred to cardiology clinic for CAD screening.  METs more than 4, extremely active, walks for 35 miles in a week.  No prior CAD.  She underwent echocardiogram in 2014 that showed normal LVEF and no valvular heart disease.  She has a family history of aortic rupture in her father.  Never smoked cigarettes.  Does not have any symptoms, no angina, DOE, orthopnea, PND, dizziness, syncope, palpitations or leg swelling.  Past Medical History:  Diagnosis Date   Allergy    prrenial   Arthritis    BPV (benign positional vertigo) 11/04/2011   GERD (gastroesophageal reflux disease)    Helicobacter pylori ab+ 2014   treated by GI   Hyperlipidemia 2013   IGT (impaired glucose tolerance) 2013   Meniere disease    Mild diastolic dysfunction 06/2013   grade 2   Obesity 2007    Prior to this had no weight issue    Polymyalgia rheumatica (HCC) 06/14/2017   Superficial thrombophlebitis 2015   Vertigo     Past Surgical History:  Procedure Laterality Date   BREAST BIOPSY Right    cyst   CATARACT EXTRACTION W/PHACO Left 07/21/2014   Procedure: CATARACT EXTRACTION PHACO AND INTRAOCULAR LENS PLACEMENT (IOC);  Surgeon: Loraine Leriche T. Nile Riggs, MD;  Location: [facility];  Service: Ophthalmology;  Laterality: Left;  CDE 3.91   CATARACT EXTRACTION W/PHACO Right 08/04/2014   Procedure: CATARACT EXTRACTION PHACO AND INTRAOCULAR LENS PLACEMENT; CDE:  5.38;  Surgeon: Loraine Leriche T. Nile Riggs, MD;  Location: [facility];  Service: Ophthalmology;  Laterality: Right;   CESAREAN SECTION  29 years ago    COLONOSCOPY  02/13/2005   Dr. Jena Gauss- normal rectum, normal colon   COLONOSCOPY N/A 01/01/2018   Procedure: COLONOSCOPY;  Surgeon: Corbin Ade, MD;  Location: [facility];  Service: Endoscopy;  Laterality: N/A;  1:00   ESOPHAGOGASTRODUODENOSCOPY N/A 06/26/2013   Procedure: ESOPHAGOGASTRODUODENOSCOPY (EGD);  Surgeon: Corbin Ade, MD;  Location: [facility];  Service: Endoscopy;  Laterality: N/A;  8:00   EYE SURGERY Bilateral 2015   cataract extraction   Right foot surgery 2003. 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       calcium carbonate (TUMS - DOSED IN MG ELEMENTAL CALCIUM) 500 MG chewable tablet Chew 2 tablets by mouth daily.       clobetasol ointment (TEMOVATE) 0.05 % Apply thin application bid for 2 weeks then 2-3 x weekly 30 g 2   conjugated estrogens (PREMARIN) vaginal cream Apply fingertip amounts daily for 1 week, then 3 times weekly as needed for total of 4 weeks only 42.5 g 0   Isopropyl Alcohol 95 % LIQD Place 2 drops into both ears once a week.      loratadine (CLARITIN) 10 MG tablet Take 1 tablet (10 mg total) by mouth daily. 90 tablet 3   Multiple Vitamin (TAB-A-VITE) TABS 1 tablet daily 90 tablet 3   naproxen sodium (ALEVE) 220 MG tablet Take 220 mg by mouth daily as needed (pain).  phenylephrine (SUDAFED PE) 10 MG TABS tablet Take 10 mg by mouth daily.     No current facility-administered medications for this visit.   Allergies:  Levaquin [levofloxacin in d5w], Prednisone, and Penicillins   Social History: The patient  reports that she has never smoked. She has never used smokeless tobacco. She reports that she does not drink alcohol and does not use drugs.   Family History: The patient's family history includes AAA (abdominal aortic aneurysm) in her father and paternal grandfather; Alcohol abuse in her brother; Coronary artery disease (age of onset: 39) in her sister; Dementia (age of onset: 7) in her mother; Diabetes in her father; Heart  disease in her father; Hypertension in her father, mother, and sister; Hypertension (age of onset: 24) in her brother.   ROS:  Please see the history of present illness. Otherwise, complete review of systems is positive for none.  All other systems are reviewed and negative.   Physical Exam: VS:  There were no vitals taken for this visit., BMI There is no height or weight on file to calculate BMI.  Wt Readings from Last 3 Encounters:  05/24/23 143 lb (64.9 kg)  05/18/23 141 lb 1.9 oz (64 kg)  03/27/23 139 lb 1.9 oz (63.1 kg)    General: Patient appears comfortable at rest. HEENT: Conjunctiva and lids normal, oropharynx clear with moist mucosa. Neck: Supple, no elevated JVP or carotid bruits, no thyromegaly. Lungs: Clear to auscultation, nonlabored breathing at rest. Cardiac: Regular rate and rhythm, no S3 or significant systolic murmur, no pericardial rub. Abdomen: Soft, nontender, no hepatomegaly, bowel sounds present, no guarding or rebound. Extremities: No pitting edema, distal pulses 2+. Skin: Warm and dry. Musculoskeletal: No kyphosis. Neuropsychiatric: Alert and oriented x3, affect grossly appropriate.  Recent Labwork: 03/21/2023: ALT 17; AST 23; BUN 16; Creatinine, Ser 0.69; Hemoglobin 14.1; Platelets 166; Potassium 4.3; Sodium 142; TSH 1.900     Component Value Date/Time   CHOL 212 (H) 03/21/2023 0827   TRIG 97 03/21/2023 0827   HDL 63 03/21/2023 0827   CHOLHDL 3.4 03/21/2023 0827   CHOLHDL 3.5 03/14/2019 0710   VLDL 24 11/28/2016 0821   LDLCALC 132 (H) 03/21/2023 0827   LDLCALC 129 (H) 03/14/2019 0710     Assessment and Plan:  CAD screening: METs more than 4, no symptoms.  I reviewed lipid panel in the last 3 years that showed LDL between 130 and 150.  Goal LDL less than 100.  Currently not on statin.  Obtain CT calcium scoring of coronaries.  She might be having coronary calcium score > 0 due to presence of mild coronary calcifications in the LAD per CT chest in 2018.   Continue aspirin 81 mg once daily.  Elevated BP reading without diagnosis of HTN: Goal BP around 140 mmHg SBP.  BP in the clinic today is normal/controlled.   I spent a total duration 30 minutes reviewing the prior echo report, notes, discussed screening of CAD and HLD management.    Medication Adjustments/Labs and Tests Ordered: Current medicines are reviewed at length with the patient today.  Concerns regarding medicines are outlined above.    Disposition:  Follow up pending results  Signed, Armani Gawlik Verne Spurr, MD, 06/05/2023 1:09 PM    [facility] at [facility] [address] [place], [place] [address]

## 2023-06-05 NOTE — Patient Instructions (Signed)
Medication Instructions:  Your physician recommends that you continue on your current medications as directed. Please refer to the Current Medication list given to you today.  *If you need a refill on your cardiac medications before your next appointment, please call your pharmacy*   Lab Work: None If you have labs (blood work) drawn today and your tests are completely normal, you will receive your results only by: MyChart Message (if you have MyChart) OR A paper copy in the mail If you have any lab test that is abnormal or we need to change your treatment, we will call you to review the results.   Testing/Procedures: CT Calcium Score   Follow-Up: At [facility], you and your health needs are our priority.  As part of our continuing mission to provide you with exceptional heart care, we have created designated Provider Care Teams.  These Care Teams include your primary Cardiologist (physician) and Advanced Practice Providers (APPs -  Physician Assistants and Nurse Practitioners) who all work together to provide you with the care you need, when you need it.  We recommend signing up for the patient portal called "MyChart".  Sign up information is provided on this After Visit Summary.  MyChart is used to connect with patients for Virtual Visits (Telemedicine).  Patients are able to view lab/test results, encounter notes, upcoming appointments, etc.  Non-urgent messages can be sent to your provider as well.   To learn more about what you can do with MyChart, go to [email or web address].    Your next appointment:    Pending follow up  Provider:   You may see Vishnu P Mallipeddi, MD or one of the following Advanced Practice Providers on your designated Care Team:   [place], PA-C  Jacolyn Reedy, [place]     Other Instructions

## 2023-06-08 ENCOUNTER — Ambulatory Visit (HOSPITAL_COMMUNITY)
Admission: RE | Admit: 2023-06-08 | Discharge: 2023-06-08 | Disposition: A | Discharge status: Home / Self Care | Payer: PPO | Source: Ambulatory Visit | Attending: Internal Medicine | Admitting: Internal Medicine

## 2023-06-08 DIAGNOSIS — E785 Hyperlipidemia, unspecified: Secondary | ICD-10-CM | POA: Insufficient documentation

## 2023-06-08 DIAGNOSIS — Z136 Encounter for screening for cardiovascular disorders: Secondary | ICD-10-CM | POA: Insufficient documentation

## 2023-06-11 ENCOUNTER — Telehealth: Payer: Self-pay

## 2023-06-11 MED ORDER — ROSUVASTATIN CALCIUM 5 MG PO TABS: 5.0000 mg | ORAL_TABLET | Freq: Every day | ORAL | 3 refills | Status: DC

## 2023-06-11 NOTE — Telephone Encounter (Signed)
-----   Message from Vishnu P Mallipeddi sent at 06/11/2023  8:52 AM EST ----- Coronary calcium score is 151 (64 percentile for age and sex matched control).  Calcium is equal to plaque and is localized in the LAD. LDL levels between 130 and 150.  Guidelines recommend initiating statin, start rosuvastatin 5 mg nightly only if patient is agreeable.  Otherwise, continue aspirin 81 mg once daily.  Side effects include muscle aches.  Patient can follow-up with PCP for HLD management and can return back to cardiology clinic if any new symptoms of chest pain/discomfort or SOB.

## 2023-06-11 NOTE — Telephone Encounter (Signed)
Pt notified via My Chart

## 2023-06-29 ENCOUNTER — Encounter: Payer: Self-pay | Admitting: Family Medicine

## 2023-07-14 ENCOUNTER — Encounter: Payer: Self-pay | Admitting: Obstetrics & Gynecology

## 2023-07-16 ENCOUNTER — Encounter: Payer: Self-pay | Admitting: Family Medicine

## 2023-07-16 ENCOUNTER — Ambulatory Visit (HOSPITAL_COMMUNITY)
Admission: RE | Admit: 2023-07-16 | Discharge: 2023-07-16 | Disposition: A | Discharge status: Home / Self Care | Payer: PPO | Source: Ambulatory Visit | Attending: Family Medicine | Admitting: Family Medicine

## 2023-07-16 ENCOUNTER — Encounter (HOSPITAL_COMMUNITY): Payer: Self-pay

## 2023-07-16 DIAGNOSIS — Z780 Asymptomatic menopausal state: Secondary | ICD-10-CM | POA: Diagnosis not present

## 2023-07-16 DIAGNOSIS — Z1231 Encounter for screening mammogram for malignant neoplasm of breast: Secondary | ICD-10-CM | POA: Insufficient documentation

## 2023-07-16 DIAGNOSIS — M85851 Other specified disorders of bone density and structure, right thigh: Secondary | ICD-10-CM | POA: Diagnosis not present

## 2023-07-25 ENCOUNTER — Encounter: Payer: Self-pay | Admitting: Obstetrics & Gynecology

## 2023-07-25 ENCOUNTER — Ambulatory Visit: Payer: PPO | Admitting: Obstetrics & Gynecology

## 2023-07-25 VITALS — BP 175/84 | HR 94 | Ht 60.0 in | Wt 139.8 lb

## 2023-07-25 DIAGNOSIS — N9089 Other specified noninflammatory disorders of vulva and perineum: Secondary | ICD-10-CM | POA: Diagnosis not present

## 2023-07-25 NOTE — Progress Notes (Signed)
 GYN VISIT Patient name: Tracy Humphrey MRN [id number]  Date of birth: [birth year] Chief Complaint:   Follow-up  History of Present Illness:   Tracy Humphrey is a 73 y.o. PM female being seen today for vulvar dermatitis.     Last visit seen by JG on 05/2023- I had stepped in for exam.  Did not think biopsy was indicated though concern for LS- treated with Clobetasol .  She had been using clobetasol  about 3x per week and has recently stopped this past Sunday.  She notes that within a few days of treatment had significant improvement of her symptoms.  Denies burning itching or irritation.  Denies pelvic or abdominal pain.  Overall doing much better and reports no acute complaints   No LMP recorded. Patient is postmenopausal.    Review of Systems:   Pertinent items are noted in HPI Denies fever/chills, dizziness, headaches, visual disturbances, fatigue, shortness of breath, chest pain, abdominal pain, vomiting, no problems with bowel movements, urination, or intercourse unless otherwise stated above.  Pertinent History Reviewed:   Past Surgical History:  Procedure Laterality Date   BREAST BIOPSY Right    cyst   CATARACT EXTRACTION W/PHACO Left 07/21/2014   Procedure: CATARACT EXTRACTION PHACO AND INTRAOCULAR LENS PLACEMENT (IOC);  Surgeon: Lavonia Powers T. Gennie Kicks, MD;  Location: [facility];  Service: Ophthalmology;  Laterality: Left;  CDE 3.91   CATARACT EXTRACTION W/PHACO Right 08/04/2014   Procedure: CATARACT EXTRACTION PHACO AND INTRAOCULAR LENS PLACEMENT; CDE:  5.38;  Surgeon: Lavonia Powers T. Gennie Kicks, MD;  Location: [facility];  Service: Ophthalmology;  Laterality: Right;   CESAREAN SECTION  29 years ago    COLONOSCOPY  02/13/2005   Dr. Riley Cheadle- normal rectum, normal colon   COLONOSCOPY N/A 01/01/2018   Procedure: COLONOSCOPY;  Surgeon: Suzette Espy, MD;  Location: [facility];  Service: Endoscopy;  Laterality: N/A;  1:00   ESOPHAGOGASTRODUODENOSCOPY N/A 06/26/2013   Procedure: ESOPHAGOGASTRODUODENOSCOPY (EGD);   Surgeon: Suzette Espy, MD;  Location: [facility];  Service: Endoscopy;  Laterality: N/A;  8:00   EYE SURGERY Bilateral 2015   cataract extraction   Right foot surgery 2003. 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 Physical Assessment:   Vitals:   07/25/23 0931 07/25/23 0952  BP: (!) 168/96 (!) 175/84  Pulse: 79 94  Weight: 139 lb 12.8 oz (63.4 kg)   Height: 5' (1.524 m)   Body mass index is 27.3 kg/m.       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  No  other abnormalities noted.  Prior area on left now resolved.    Extremities: no edema   Chaperone: Lorean Rodes    Assessment & Plan:  1) vulvar dermatitis/irritation -symptoms have completely resolved -Will continue to use clobetasol  as needed []  pt to call if refill needed -Would consider biopsy should there be an area of concern  2) Elevated BP/White coat syndrome -pt has PCP   No orders of the defined types were placed in this encounter.   Return in about 1 year (around 07/24/2024) for LS follow up/annual (Kordae Buonocore or  Griffin).   Marget Outten, DO Attending Obstetrician & Gynecologist, [facility] for [employer], [facility]

## 2023-09-06 DIAGNOSIS — M79672 Pain in left foot: Secondary | ICD-10-CM | POA: Diagnosis not present

## 2023-09-06 DIAGNOSIS — B070 Plantar wart: Secondary | ICD-10-CM | POA: Diagnosis not present

## 2023-09-17 DIAGNOSIS — E785 Hyperlipidemia, unspecified: Secondary | ICD-10-CM | POA: Diagnosis not present

## 2023-09-17 DIAGNOSIS — R7303 Prediabetes: Secondary | ICD-10-CM | POA: Diagnosis not present

## 2023-09-18 LAB — BMP8+EGFR
BUN/Creatinine Ratio: 24 (ref 12–28)
BUN: 18 mg/dL (ref 8–27)
CO2: 24 mmol/L (ref 20–29)
Calcium: 9.7 mg/dL (ref 8.7–10.3)
Chloride: 101 mmol/L (ref 96–106)
Creatinine, Ser: 0.76 mg/dL (ref 0.57–1.00)
Glucose: 111 mg/dL — ABNORMAL HIGH (ref 70–99)
Potassium: 4 mmol/L (ref 3.5–5.2)
Sodium: 142 mmol/L (ref 134–144)
eGFR: 82 mL/min/{1.73_m2} (ref >59)

## 2023-09-18 LAB — LIPID PANEL
Chol/HDL Ratio: 3.1 ratio (ref 0.0–4.4)
Cholesterol, Total: 212 mg/dL — ABNORMAL HIGH (ref 100–199)
HDL: 69 mg/dL (ref >39)
LDL Chol Calc (NIH): 121 mg/dL — ABNORMAL HIGH (ref 0–99)
Triglycerides: 128 mg/dL (ref 0–149)
VLDL Cholesterol Cal: 22 mg/dL (ref 5–40)

## 2023-09-18 LAB — HEMOGLOBIN A1C
Est. average glucose Bld gHb Est-mCnc: 120 mg/dL
Hgb A1c MFr Bld: 5.8 % — ABNORMAL HIGH (ref 4.8–5.6)

## 2023-09-25 ENCOUNTER — Encounter: Payer: Self-pay | Admitting: Family Medicine

## 2023-09-25 ENCOUNTER — Ambulatory Visit: Payer: PPO | Admitting: Family Medicine

## 2023-09-25 VITALS — BP 148/82 | HR 91 | Ht 60.0 in | Wt 138.1 lb

## 2023-09-25 DIAGNOSIS — N9089 Other specified noninflammatory disorders of vulva and perineum: Secondary | ICD-10-CM | POA: Diagnosis not present

## 2023-09-25 DIAGNOSIS — E8881 Metabolic syndrome: Secondary | ICD-10-CM | POA: Diagnosis not present

## 2023-09-25 DIAGNOSIS — Z1329 Encounter for screening for other suspected endocrine disorder: Secondary | ICD-10-CM | POA: Diagnosis not present

## 2023-09-25 DIAGNOSIS — E559 Vitamin D deficiency, unspecified: Secondary | ICD-10-CM | POA: Diagnosis not present

## 2023-09-25 DIAGNOSIS — E785 Hyperlipidemia, unspecified: Secondary | ICD-10-CM | POA: Diagnosis not present

## 2023-09-25 DIAGNOSIS — R7303 Prediabetes: Secondary | ICD-10-CM

## 2023-09-25 DIAGNOSIS — R030 Elevated blood-pressure reading, without diagnosis of hypertension: Secondary | ICD-10-CM | POA: Diagnosis not present

## 2023-09-25 NOTE — Assessment & Plan Note (Signed)
 Resolved.managed by [facility]

## 2023-09-25 NOTE — Assessment & Plan Note (Signed)
 Patient educated about the importance of limiting  Carbohydrate intake , the need to commit to daily physical activity for a minimum of 30 minutes , and to commit weight loss. The fact that changes in all these areas will reduce or eliminate all together the development of diabetes is stressed.      Latest Ref Rng & Units 09/17/2023    9:09 AM 03/21/2023    8:27 AM 09/15/2022    8:43 AM 03/17/2022    8:27 AM 07/12/2021    8:20 AM  Diabetic Labs  HbA1c 4.8 - 5.6 % 5.8  5.9  6.0  5.9  5.8   Chol 100 - 199 mg/dL 086  578  469  629  528   HDL >39 mg/dL 69  63  68  58  71   Calc LDL 0 - 99 mg/dL 413  244  010  272  536   Triglycerides 0 - 149 mg/dL 644  97  034  742  595   Creatinine 0.57 - 1.00 mg/dL 6.38  7.56   4.33        09/25/2023    9:36 AM 09/25/2023    8:53 AM 09/25/2023    8:47 AM 07/25/2023    9:52 AM 07/25/2023    9:31 AM 06/05/2023    1:19 PM 05/24/2023    2:02 PM  BP/Weight  Systolic BP 148 160 176 175 168 138 156  Diastolic BP 82 80 83 84 96 82 81  Wt. (Lbs)   138.12  139.8 139   BMI   26.97 kg/m2  27.3 kg/m2 27.15 kg/m2       Latest Ref Rng & Units 06/17/2018    1:20 PM 08/07/2017   12:00 AM  Foot/eye exam completion dates  Eye Exam No Retinopathy  No Retinopathy      Foot Form Completion  Done      This result is from an external source.    Improved slightly congratulated on this Updated lab needed at/ before next visit.

## 2023-09-25 NOTE — Patient Instructions (Addendum)
 Annual exam in September, pls schedule at checkout   Fasting lipid, cmp and EGFr, tSH, CBC, Vit D, HBA1C3 to 5 days before next appointment   My sincere condolence and prayers for you during this time   It is important that you exercise regularly at least 30 minutes 5 times a week. If you develop chest pain, have severe difficulty breathing, or feel very tired, stop exercising immediately and seek medical attention  Thanks for choosing [facility], we consider it a privelige to serve you.

## 2023-09-25 NOTE — Assessment & Plan Note (Signed)
 DASH diet and commitment to daily physical activity for a minimum of 30 minutes discussed and encouraged, as a part of hypertension management. The importance of attaining a healthy weight is also discussed.     09/25/2023    9:36 AM 09/25/2023    8:53 AM 09/25/2023    8:47 AM 07/25/2023    9:52 AM 07/25/2023    9:31 AM 06/05/2023    1:19 PM 05/24/2023    2:02 PM  BP/Weight  Systolic BP 148 160 176 175 168 138 156  Diastolic BP 82 80 83 84 96 82 81  Wt. (Lbs)   138.12  139.8 139   BMI   26.97 kg/m2  27.3 kg/m2 27.15 kg/m2

## 2023-09-25 NOTE — Assessment & Plan Note (Signed)
Controlled, no change in management Updated lab needed at/ before next visit.   

## 2023-09-25 NOTE — Progress Notes (Signed)
 Tracy Humphrey     MRN: [id number]      DOB: 1950/12/19  Chief Complaint  Patient presents with   Medical Management of Chronic Issues    6 month chronic f/u  Brother has recently passed in Feb. So she is feeling a bit down,.  Sister has chronic issues and has been hospitalized so she is experiencing stress as well.     HPI Tracy Humphrey is here for follow up and re-evaluation of chronic medical conditions, medication management and review of any available recent lab and radiology data.  Preventive health is updated, specifically  Cancer screening and Immunization.   Questions or concerns regarding consultations or procedures which the PT has had in the interim are  addressed. The PT denies any adverse reactions to current medications since the last visit.  Concerns as above Healthy lifestyle in terms of exercise and food choice continue ROS Denies recent fever or chills. Denies sinus pressure, nasal congestion, ear pain or sore throat. Denies chest congestion, productive cough or wheezing. Denies chest pains, palpitations and leg swelling Denies abdominal pain, nausea, vomiting,diarrhea or constipation.   Denies dysuria, frequency, hesitancy or incontinence. Denies joint pain, swelling and limitation in mobility. Denies headaches, seizures, numbness, or tingling. Denies skin break down or rash.   PE  BP (!) 148/82   Pulse 91   Ht 5' (1.524 m)   Wt 138 lb 1.9 oz (62.7 kg)   SpO2 97%   BMI 26.97 kg/m   Patient alert and oriented and in no cardiopulmonary distress.  HEENT: No facial asymmetry, EOMI,     Neck supple .  Chest: Clear to auscultation bilaterally.  CVS: S1, S2 no murmurs, no S3.Regular rate.  ABD: Soft non tender.   Ext: No edema  MS: Adequate ROM spine, shoulders, hips and knees.  Skin: Intact, no ulcerations or rash noted.  Psych: Good eye contact, normal affect. Memory intact not anxious or depressed appearing.  CNS: CN 2-12 intact, power,  normal  throughout.no focal deficits noted.   Assessment & Plan  Prediabetes Patient educated about the importance of limiting  Carbohydrate intake , the need to commit to daily physical activity for a minimum of 30 minutes , and to commit weight loss. The fact that changes in all these areas will reduce or eliminate all together the development of diabetes is stressed.      Latest Ref Rng & Units 09/17/2023    9:09 AM 03/21/2023    8:27 AM 09/15/2022    8:43 AM 03/17/2022    8:27 AM 07/12/2021    8:20 AM  Diabetic Labs  HbA1c 4.8 - 5.6 % 5.8  5.9  6.0  5.9  5.8   Chol 100 - 199 mg/dL 409  811  914  782  956   HDL >39 mg/dL 69  63  68  58  71   Calc LDL 0 - 99 mg/dL 213  086  578  469  629   Triglycerides 0 - 149 mg/dL 528  97  413  244  010   Creatinine 0.57 - 1.00 mg/dL 2.72  5.36   6.44        09/25/2023    9:36 AM 09/25/2023    8:53 AM 09/25/2023    8:47 AM 07/25/2023    9:52 AM 07/25/2023    9:31 AM 06/05/2023    1:19 PM 05/24/2023    2:02 PM  BP/Weight  Systolic BP 148 160 176 175 168  138 156  Diastolic BP 82 80 83 84 96 82 81  Wt. (Lbs)   138.12  139.8 139   BMI   26.97 kg/m2  27.3 kg/m2 27.15 kg/m2       Latest Ref Rng & Units 06/17/2018    1:20 PM 08/07/2017   12:00 AM  Foot/eye exam completion dates  Eye Exam No Retinopathy  No Retinopathy      Foot Form Completion  Done      This result is from an external source.    Improved slightly congratulated on this Updated lab needed at/ before next visit.   Dyslipidemia, goal LDL below 100 Hyperlipidemia:Low fat diet discussed and encouraged.   Lipid Panel  Lab Results  Component Value Date   CHOL 212 (H) 09/17/2023   HDL 69 09/17/2023   LDLCALC 121 (H) 09/17/2023   TRIG 128 09/17/2023   CHOLHDL 3.1 09/17/2023     Improved continue ditary management   Elevated blood pressure reading in office without diagnosis of hypertension DASH diet and commitment to daily physical activity for a minimum of 30 minutes discussed  and encouraged, as a part of hypertension management. The importance of attaining a healthy weight is also discussed.     09/25/2023    9:36 AM 09/25/2023    8:53 AM 09/25/2023    8:47 AM 07/25/2023    9:52 AM 07/25/2023    9:31 AM 06/05/2023    1:19 PM 05/24/2023    2:02 PM  BP/Weight  Systolic BP 148 160 176 175 168 138 156  Diastolic BP 82 80 83 84 96 82 81  Wt. (Lbs)   138.12  139.8 139   BMI   26.97 kg/m2  27.3 kg/m2 27.15 kg/m2        Vulvar irritation Resolved.managed by [facility]

## 2023-09-25 NOTE — Assessment & Plan Note (Signed)
 Hyperlipidemia:Low fat diet discussed and encouraged.   Lipid Panel  Lab Results  Component Value Date   CHOL 212 (H) 09/17/2023   HDL 69 09/17/2023   LDLCALC 121 (H) 09/17/2023   TRIG 128 09/17/2023   CHOLHDL 3.1 09/17/2023     Improved continue ditary management

## 2024-03-12 DIAGNOSIS — M79675 Pain in left toe(s): Secondary | ICD-10-CM | POA: Diagnosis not present

## 2024-03-12 DIAGNOSIS — S91202A Unspecified open wound of left great toe with damage to nail, initial encounter: Secondary | ICD-10-CM | POA: Diagnosis not present

## 2024-03-28 ENCOUNTER — Encounter: Admitting: Family Medicine

## 2024-04-15 ENCOUNTER — Other Ambulatory Visit (HOSPITAL_COMMUNITY): Payer: Self-pay | Admitting: Family Medicine

## 2024-04-15 DIAGNOSIS — Z1231 Encounter for screening mammogram for malignant neoplasm of breast: Secondary | ICD-10-CM

## 2024-04-28 ENCOUNTER — Ambulatory Visit: Admitting: Adult Health

## 2024-04-29 DIAGNOSIS — M21622 Bunionette of left foot: Secondary | ICD-10-CM | POA: Diagnosis not present

## 2024-04-29 DIAGNOSIS — M21621 Bunionette of right foot: Secondary | ICD-10-CM | POA: Diagnosis not present

## 2024-05-13 DIAGNOSIS — H43393 Other vitreous opacities, bilateral: Secondary | ICD-10-CM | POA: Diagnosis not present

## 2024-06-27 ENCOUNTER — Other Ambulatory Visit: Payer: Self-pay | Admitting: Family Medicine

## 2024-06-27 DIAGNOSIS — E785 Hyperlipidemia, unspecified: Secondary | ICD-10-CM

## 2024-06-27 DIAGNOSIS — E559 Vitamin D deficiency, unspecified: Secondary | ICD-10-CM

## 2024-06-27 DIAGNOSIS — R7303 Prediabetes: Secondary | ICD-10-CM

## 2024-06-27 DIAGNOSIS — R030 Elevated blood-pressure reading, without diagnosis of hypertension: Secondary | ICD-10-CM

## 2024-06-28 LAB — CMP14+EGFR
ALT: 17 IU/L (ref 0–32)
AST: 21 IU/L (ref 0–40)
Albumin: 4.4 g/dL (ref 3.8–4.8)
Alkaline Phosphatase: 96 IU/L (ref 49–135)
BUN/Creatinine Ratio: 20 (ref 12–28)
BUN: 16 mg/dL (ref 8–27)
Bilirubin Total: 0.6 mg/dL (ref 0.0–1.2)
CO2: 26 mmol/L (ref 20–29)
Calcium: 9.9 mg/dL (ref 8.7–10.3)
Chloride: 101 mmol/L (ref 96–106)
Creatinine, Ser: 0.81 mg/dL (ref 0.57–1.00)
Globulin, Total: 2.6 g/dL (ref 1.5–4.5)
Glucose: 98 mg/dL (ref 70–99)
Potassium: 4.1 mmol/L (ref 3.5–5.2)
Sodium: 142 mmol/L (ref 134–144)
Total Protein: 7 g/dL (ref 6.0–8.5)
eGFR: 76 mL/min/1.73

## 2024-06-28 LAB — CBC WITH DIFFERENTIAL/PLATELET
Basophils Absolute: 0 x10E3/uL (ref 0.0–0.2)
Basos: 0 %
EOS (ABSOLUTE): 0.1 x10E3/uL (ref 0.0–0.4)
Eos: 2 %
Hematocrit: 40.4 % (ref 34.0–46.6)
Hemoglobin: 13.2 g/dL (ref 11.1–15.9)
Immature Grans (Abs): 0 x10E3/uL (ref 0.0–0.1)
Immature Granulocytes: 0 %
Lymphocytes Absolute: 2.3 x10E3/uL (ref 0.7–3.1)
Lymphs: 32 %
MCH: 30.6 pg (ref 26.6–33.0)
MCHC: 32.7 g/dL (ref 31.5–35.7)
MCV: 94 fL (ref 79–97)
Monocytes Absolute: 0.5 x10E3/uL (ref 0.1–0.9)
Monocytes: 7 %
Neutrophils Absolute: 4.2 x10E3/uL (ref 1.4–7.0)
Neutrophils: 59 %
Platelets: 195 x10E3/uL (ref 150–450)
RBC: 4.32 x10E6/uL (ref 3.77–5.28)
RDW: 12.3 % (ref 11.7–15.4)
WBC: 7.1 x10E3/uL (ref 3.4–10.8)

## 2024-06-28 LAB — LIPID PANEL
Chol/HDL Ratio: 2.9 ratio (ref 0.0–4.4)
Cholesterol, Total: 189 mg/dL (ref 100–199)
HDL: 66 mg/dL
LDL Chol Calc (NIH): 100 mg/dL — ABNORMAL HIGH (ref 0–99)
Triglycerides: 131 mg/dL (ref 0–149)
VLDL Cholesterol Cal: 23 mg/dL (ref 5–40)

## 2024-06-28 LAB — HEMOGLOBIN A1C
Est. average glucose Bld gHb Est-mCnc: 126 mg/dL
Hgb A1c MFr Bld: 6 % — ABNORMAL HIGH (ref 4.8–5.6)

## 2024-06-28 LAB — TSH+FREE T4
Free T4: 0.95 ng/dL (ref 0.82–1.77)
TSH: 1.82 u[IU]/mL (ref 0.450–4.500)

## 2024-06-28 LAB — VITAMIN D 25 HYDROXY (VIT D DEFICIENCY, FRACTURES): Vit D, 25-Hydroxy: 62.8 ng/mL (ref 30.0–100.0)

## 2024-06-29 ENCOUNTER — Ambulatory Visit: Payer: Self-pay | Admitting: Family Medicine

## 2024-07-07 ENCOUNTER — Other Ambulatory Visit: Payer: Self-pay | Admitting: Internal Medicine

## 2024-07-08 ENCOUNTER — Encounter: Payer: Self-pay | Admitting: Family Medicine

## 2024-07-08 ENCOUNTER — Ambulatory Visit (INDEPENDENT_AMBULATORY_CARE_PROVIDER_SITE_OTHER): Admitting: Family Medicine

## 2024-07-08 VITALS — BP 138/84 | HR 76 | Resp 16 | Ht 60.0 in | Wt 136.0 lb

## 2024-07-08 DIAGNOSIS — R7303 Prediabetes: Secondary | ICD-10-CM | POA: Diagnosis not present

## 2024-07-08 DIAGNOSIS — E785 Hyperlipidemia, unspecified: Secondary | ICD-10-CM | POA: Diagnosis not present

## 2024-07-08 DIAGNOSIS — Z0001 Encounter for general adult medical examination with abnormal findings: Secondary | ICD-10-CM | POA: Diagnosis not present

## 2024-07-08 DIAGNOSIS — R030 Elevated blood-pressure reading, without diagnosis of hypertension: Secondary | ICD-10-CM

## 2024-07-08 NOTE — Assessment & Plan Note (Signed)
 Hyperlipidemia:Low fat diet discussed and encouraged.   Lipid Panel  Lab Results  Component Value Date   CHOL 189 06/27/2024   HDL 66 06/27/2024   LDLCALC 100 (H) 06/27/2024   TRIG 131 06/27/2024   CHOLHDL 2.9 06/27/2024     Increase crestor  to 4 days per week based on f/h, improved on way currently take as in 3 days / week Updated lab needed at/ before next visit.

## 2024-07-08 NOTE — Patient Instructions (Signed)
 F/U in 6 months  Fating lipid, cmp and EGFR and HBa1C 1 week before next appointment  Cologuard in April  Looking forward to hearing about your [place] family vacation  Increase crestor  5 mg for FOUR days per week, continue low fat diet  Please reduce sweet tea to 12 ounces per day, your blood sugar has increase slightly  Please accept sincere condolence regarding your recent losses, may you be comforted and remember the love and joy you had with them  Continue to have your dear grand daughter keep you energized and moving , also your dear husband Marcey and son Si!!!   It is important that you exercise regularly at least 30 minutes 5 times a week. If you develop chest pain, have severe difficulty breathing, or feel very tired, stop exercising immediately and seek medical attention   Think about what you will eat, plan ahead. Choose  clean, green, fresh or frozen over canned, processed or packaged foods which are more sugary, salty and fatty. 70 to 75% of food eaten should be vegetables and fruit. Three meals at set times with snacks allowed between meals, but they must be fruit or vegetables. Aim to eat over a 12 hour period , example 7 am to 7 pm, and STOP after  your last meal of the day. Drink water ,generally about 64 ounces per day, no other drink is as healthy. Fruit juice is best enjoyed in a healthy way, by EATING the fruit.  Thanks for choosing [facility], we consider it a privelige to serve you.

## 2024-07-08 NOTE — Assessment & Plan Note (Signed)
 Unchanged DASH diet and commitment to daily physical activity for a minimum of 30 minutes discussed and encouraged, as a part of hypertension management. The importance of maintaining  a healthy weight is also discussed.     07/08/2024    3:05 PM 07/08/2024    2:34 PM 09/25/2023    9:36 AM 09/25/2023    8:53 AM 09/25/2023    8:47 AM 07/25/2023    9:52 AM 07/25/2023    9:31 AM  BP/Weight  Systolic BP 138 169 148 160 176 175 168  Diastolic BP 84 87 82 80 83 84 96  Wt. (Lbs)  136   138.12  139.8  BMI  26.56 kg/m2   26.97 kg/m2  27.3 kg/m2

## 2024-07-08 NOTE — Assessment & Plan Note (Addendum)
 Deteriorated needs to reduce sweet tea Updated lab needed at/ before next visit.  Patient educated about the importance of limiting  Carbohydrate intake , the need to commit to daily physical activity for a minimum of 30 minutes , and to commit weight loss. The fact that changes in all these areas will reduce or eliminate all together the development of diabetes is stressed.      Latest Ref Rng & Units 06/27/2024    8:20 AM 09/17/2023    9:09 AM 03/21/2023    8:27 AM 09/15/2022    8:43 AM 03/17/2022    8:27 AM  Diabetic Labs  HbA1c 4.8 - 5.6 % 6.0  5.8  5.9  6.0  5.9   Chol 100 - 199 mg/dL 810  787  787  753  787   HDL >39 mg/dL 66  69  63  68  58   Calc LDL 0 - 99 mg/dL 899  878  867  846  868   Triglycerides 0 - 149 mg/dL 868  871  97  859  872   Creatinine 0.57 - 1.00 mg/dL 9.18  9.23  9.30   9.23       07/08/2024    3:05 PM 07/08/2024    2:34 PM 09/25/2023    9:36 AM 09/25/2023    8:53 AM 09/25/2023    8:47 AM 07/25/2023    9:52 AM 07/25/2023    9:31 AM  BP/Weight  Systolic BP 138 169 148 160 176 175 168  Diastolic BP 84 87 82 80 83 84 96  Wt. (Lbs)  136   138.12  139.8  BMI  26.56 kg/m2   26.97 kg/m2  27.3 kg/m2      Latest Ref Rng & Units 06/17/2018    1:20 PM 08/07/2017   12:00 AM  Foot/eye exam completion dates  Eye Exam No Retinopathy  No Retinopathy      Foot Form Completion  Done      This result is from an external source.

## 2024-07-08 NOTE — Assessment & Plan Note (Signed)
 Annual exam as documented. . Immunization and cancer screening needs are specifically addressed at this visit.

## 2024-07-08 NOTE — Progress Notes (Signed)
 "   Tracy Humphrey     MRN: [id number]      DOB: [birth year]  Chief Complaint  Patient presents with   Annual Exam    Cpe     HPI: Patient is in for annual physical exam. No other health concerns are expressed or addressed at the visit. Recent labs,  are reviewed. Immunization is reviewed , and  updated if needed.   PE: BP 138/84   Pulse 76   Resp 16   Ht 5' (1.524 m)   Wt 136 lb (61.7 kg)   SpO2 96%   BMI 26.56 kg/m   Pleasant  female, alert and oriented x 3, in no cardio-pulmonary distress. Afebrile. HEENT No facial trauma or asymetry. Sinuses non tender.  Extra occullar muscles intact.. External ears normal, . Neck: supple, no adenopathy,JVD or thyromegaly.No bruits.  Chest: Clear to ascultation bilaterally.No crackles or wheezes. Non tender to palpation  Cardiovascular system; Heart sounds normal,  S1 and  S2 ,no S3.  No murmur, or thrill. Apical beat not displaced Peripheral pulses normal.  Abdomen: Soft, non tender,no organomegaly or mass palpable No change in stool caliber or pattern noted    Musculoskeletal exam: Full ROM of spine, hips , shoulders and knees. No deformity ,swelling or crepitus noted. No muscle wasting or atrophy.   Neurologic: Cranial nerves 2 to 12 intact. Power, tone ,sensation  normal throughout. No disturbance in gait. No tremor.  Skin: Intact, no ulceration, erythema , scaling or rash noted. Pigmentation normal throughout  Psych; Normal mood and affect. Judgement and concentration normal   Assessment & Plan:  Encounter for Medicare annual examination with abnormal findings Annual exam as documented.  Immunization and cancer screening needs are specifically addressed at this visit.   Dyslipidemia, goal LDL below 100 Hyperlipidemia:Low fat diet discussed and encouraged.   Lipid Panel  Lab Results  Component Value Date   CHOL 189 06/27/2024   HDL 66 06/27/2024   LDLCALC 100 (H) 06/27/2024   TRIG 131 06/27/2024    CHOLHDL 2.9 06/27/2024     Increase crestor  to 4 days per week based on f/h, improved on way currently take as in 3 days / week Updated lab needed at/ before next visit.   Elevated blood pressure reading in office without diagnosis of hypertension Unchanged DASH diet and commitment to daily physical activity for a minimum of 30 minutes discussed and encouraged, as a part of hypertension management. The importance of maintaining  a healthy weight is also discussed.     07/08/2024    3:05 PM 07/08/2024    2:34 PM 09/25/2023    9:36 AM 09/25/2023    8:53 AM 09/25/2023    8:47 AM 07/25/2023    9:52 AM 07/25/2023    9:31 AM  BP/Weight  Systolic BP 138 169 148 160 176 175 168  Diastolic BP 84 87 82 80 83 84 96  Wt. (Lbs)  136   138.12  139.8  BMI  26.56 kg/m2   26.97 kg/m2  27.3 kg/m2       Prediabetes Deteriorated needs to reduce sweet tea Updated lab needed at/ before next visit.  Patient educated about the importance of limiting  Carbohydrate intake , the need to commit to daily physical activity for a minimum of 30 minutes , and to commit weight loss. The fact that changes in all these areas will reduce or eliminate all together the development of diabetes is stressed.      Latest Ref  Rng & Units 06/27/2024    8:20 AM 09/17/2023    9:09 AM 03/21/2023    8:27 AM 09/15/2022    8:43 AM 03/17/2022    8:27 AM  Diabetic Labs  HbA1c 4.8 - 5.6 % 6.0  5.8  5.9  6.0  5.9   Chol 100 - 199 mg/dL 810  787  787  753  787   HDL >39 mg/dL 66  69  63  68  58   Calc LDL 0 - 99 mg/dL 899  878  867  846  868   Triglycerides 0 - 149 mg/dL 868  871  97  859  872   Creatinine 0.57 - 1.00 mg/dL 9.18  9.23  9.30   9.23       07/08/2024    3:05 PM 07/08/2024    2:34 PM 09/25/2023    9:36 AM 09/25/2023    8:53 AM 09/25/2023    8:47 AM 07/25/2023    9:52 AM 07/25/2023    9:31 AM  BP/Weight  Systolic BP 138 169 148 160 176 175 168  Diastolic BP 84 87 82 80 83 84 96  Wt. (Lbs)  136   138.12  139.8   BMI  26.56 kg/m2   26.97 kg/m2  27.3 kg/m2      Latest Ref Rng & Units 06/17/2018    1:20 PM 08/07/2017   12:00 AM  Foot/eye exam completion dates  Eye Exam No Retinopathy  No Retinopathy      Foot Form Completion  Done      This result is from an external source.     "

## 2024-07-17 ENCOUNTER — Encounter: Payer: Self-pay | Admitting: Obstetrics & Gynecology

## 2024-07-17 ENCOUNTER — Ambulatory Visit: Admitting: Obstetrics & Gynecology

## 2024-07-17 VITALS — BP 188/93 | Ht 60.0 in | Wt 141.0 lb

## 2024-07-17 DIAGNOSIS — R030 Elevated blood-pressure reading, without diagnosis of hypertension: Secondary | ICD-10-CM

## 2024-07-17 DIAGNOSIS — L900 Lichen sclerosus et atrophicus: Secondary | ICD-10-CM

## 2024-07-17 MED ORDER — CLOBETASOL PROPIONATE 0.05 % EX OINT: TOPICAL_OINTMENT | CUTANEOUS | 1 refills | Status: AC

## 2024-07-17 NOTE — Progress Notes (Signed)
 "  GYN VISIT Patient name: LESTER PLATAS MRN [id number]  Date of birth: [birth year] Chief Complaint:   Annual Exam (LS follow up)  History of Present Illness:   BORGHILD THAKER is a 74 y.o. PM female being seen today for yearly follow up regarding:  Lichen sclerosus: -States that symptoms have been well controlled -Twice a week- warm epson  -Using ointment every other day - though at time may forget  Currently asymptomatic- denies itching, irritation or discomfort.  Denies pelvic or abodminal pain.  No acute complaints.  No LMP recorded. Patient is postmenopausal.    Review of Systems:   Pertinent items are noted in HPI Denies fever/chills, dizziness, headaches, visual disturbances, fatigue, shortness of breath, chest pain, abdominal pain, vomiting. Pertinent History Reviewed:   Past Surgical History:  Procedure Laterality Date   BREAST BIOPSY Right    cyst   CATARACT EXTRACTION W/PHACO Left 07/21/2014   Procedure: CATARACT EXTRACTION PHACO AND INTRAOCULAR LENS PLACEMENT (IOC);  Surgeon: Oneil T. Roz, MD;  Location: [facility];  Service: Ophthalmology;  Laterality: Left;  CDE 3.91   CATARACT EXTRACTION W/PHACO Right 08/04/2014   Procedure: CATARACT EXTRACTION PHACO AND INTRAOCULAR LENS PLACEMENT; CDE:  5.38;  Surgeon: Oneil T. Roz, MD;  Location: [facility];  Service: Ophthalmology;  Laterality: Right;   CESAREAN SECTION  29 years ago    COLONOSCOPY  02/13/2005   Dr. Shaaron- normal rectum, normal colon   COLONOSCOPY N/A 01/01/2018   Procedure: COLONOSCOPY;  Surgeon: Shaaron Lamar HERO, MD;  Location: [facility];  Service: Endoscopy;  Laterality: N/A;  1:00   ESOPHAGOGASTRODUODENOSCOPY N/A 06/26/2013   Procedure: ESOPHAGOGASTRODUODENOSCOPY (EGD);  Surgeon: Lamar HERO Shaaron, MD;  Location: [facility];  Service: Endoscopy;  Laterality: N/A;  8:00   EYE SURGERY Bilateral 2015   cataract extraction   Right foot surgery 2003. 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 Physical Assessment:   Vitals:   07/17/24 0840 07/17/24 0907  BP: (!) 179/84 (!) 188/93  Weight: 141 lb (64 kg)   Height: 5' (1.524 m)   Body mass index is 27.54 kg/m.       Physical Examination:   General appearance: alert, well appearing, and in no distress  Psych: mood appropriate, normal affect  Skin: warm & dry   Cardiovascular: normal heart rate noted  Respiratory: normal respiratory effort, no distress  Abdomen: soft, non-tender   Pelvic: VULVA: narrowed introitus- no discrete mass or lesions noted, labia minor appear slightly erythematic, but no other acute abnormalities noted.   VAGINA: normal appearing vagina with normal color and discharge, no lesions  Extremities: no edema   Chaperone: pt declined    Assessment & Plan:  1) LS -doing well with current regimen- may decrease to weekly or as needed -Rx sent  in -no acute areas of concern  2) Elevated BP -pt notes feeling anxious due to alarm (chirping fire alarm) -pt has PCP for follow up, prior BP 07/08/2024- within normal range  Meds ordered this encounter  Medications   clobetasol  ointment (TEMOVATE ) 0.05 %    Sig: Use pea-sized amount a few times per week as needed    Dispense:  30 g    Refill:  1      Return in about 1 year (around 07/17/2025) for Annual.   Aldahir Litaker, DO Attending Obstetrician & Gynecologist, [facility] for  [facility], [facility]    "

## 2024-07-18 ENCOUNTER — Ambulatory Visit (HOSPITAL_COMMUNITY)
Admission: RE | Admit: 2024-07-18 | Discharge: 2024-07-18 | Disposition: A | Source: Ambulatory Visit | Attending: Family Medicine | Admitting: Family Medicine

## 2024-07-18 ENCOUNTER — Encounter (HOSPITAL_COMMUNITY): Payer: Self-pay

## 2024-07-18 DIAGNOSIS — Z1231 Encounter for screening mammogram for malignant neoplasm of breast: Secondary | ICD-10-CM | POA: Insufficient documentation

## 2025-01-06 ENCOUNTER — Ambulatory Visit: Payer: Self-pay | Admitting: Family Medicine
